# Patient Record
Sex: Female | Born: 1970 | Race: White | Hispanic: No | Marital: Single | Smoking: Former smoker
Health system: Southern US, Community
[De-identification: ages and names within clinical notes are randomized; demographics above are authoritative.]

## PROBLEM LIST (undated history)

## (undated) DIAGNOSIS — B1920 Unspecified viral hepatitis C without hepatic coma: Secondary | ICD-10-CM

## (undated) DIAGNOSIS — E785 Hyperlipidemia, unspecified: Secondary | ICD-10-CM

## (undated) DIAGNOSIS — T8859XA Other complications of anesthesia, initial encounter: Secondary | ICD-10-CM

## (undated) DIAGNOSIS — I10 Essential (primary) hypertension: Secondary | ICD-10-CM

## (undated) DIAGNOSIS — F329 Major depressive disorder, single episode, unspecified: Secondary | ICD-10-CM

## (undated) DIAGNOSIS — F32A Depression, unspecified: Secondary | ICD-10-CM

## (undated) DIAGNOSIS — B1910 Unspecified viral hepatitis B without hepatic coma: Secondary | ICD-10-CM

## (undated) DIAGNOSIS — E079 Disorder of thyroid, unspecified: Secondary | ICD-10-CM

## (undated) DIAGNOSIS — Z9889 Other specified postprocedural states: Secondary | ICD-10-CM

## (undated) DIAGNOSIS — N189 Chronic kidney disease, unspecified: Secondary | ICD-10-CM

## (undated) DIAGNOSIS — G589 Mononeuropathy, unspecified: Secondary | ICD-10-CM

## (undated) DIAGNOSIS — R112 Nausea with vomiting, unspecified: Secondary | ICD-10-CM

## (undated) DIAGNOSIS — E119 Type 2 diabetes mellitus without complications: Secondary | ICD-10-CM

## (undated) HISTORY — PX: CYSTECTOMY: SUR359

## (undated) HISTORY — DX: Chronic kidney disease, unspecified: N18.9

## (undated) HISTORY — DX: Mononeuropathy, unspecified: G58.9

## (undated) HISTORY — DX: Hyperlipidemia, unspecified: E78.5

## (undated) HISTORY — DX: Depression, unspecified: F32.A

## (undated) HISTORY — PX: BACK SURGERY: SHX140

---
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
## 2013-07-10 ENCOUNTER — Inpatient Hospital Stay: Payer: Self-pay | Admitting: Internal Medicine

## 2013-07-10 LAB — HEMOGLOBIN A1C: Hemoglobin A1C: 13.8 % — ABNORMAL HIGH (ref 4.2–6.3)

## 2013-07-10 LAB — URINALYSIS, COMPLETE
BILIRUBIN, UR: NEGATIVE
Bacteria: NONE SEEN
Leukocyte Esterase: NEGATIVE
Nitrite: NEGATIVE
PH: 6 (ref 4.5–8.0)
RBC,UR: 295 /HPF (ref 0–5)
Specific Gravity: 1.033 (ref 1.003–1.030)
WBC UR: 27 /HPF (ref 0–5)

## 2013-07-10 LAB — CBC
HCT: 40.2 % (ref 35.0–47.0)
HGB: 14.1 g/dL (ref 12.0–16.0)
MCH: 31.5 pg (ref 26.0–34.0)
MCHC: 35 g/dL (ref 32.0–36.0)
MCV: 90 fL (ref 80–100)
PLATELETS: 281 10*3/uL (ref 150–440)
RBC: 4.47 10*6/uL (ref 3.80–5.20)
RDW: 12.6 % (ref 11.5–14.5)
WBC: 18.5 10*3/uL — ABNORMAL HIGH (ref 3.6–11.0)

## 2013-07-10 LAB — COMPREHENSIVE METABOLIC PANEL
Albumin: 3.2 g/dL — ABNORMAL LOW (ref 3.4–5.0)
Alkaline Phosphatase: 121 U/L — ABNORMAL HIGH
Anion Gap: 11 (ref 7–16)
BUN: 15 mg/dL (ref 7–18)
Bilirubin,Total: 1.1 mg/dL — ABNORMAL HIGH (ref 0.2–1.0)
CHLORIDE: 98 mmol/L (ref 98–107)
CO2: 23 mmol/L (ref 21–32)
CREATININE: 1.17 mg/dL (ref 0.60–1.30)
Calcium, Total: 9.1 mg/dL (ref 8.5–10.1)
EGFR (Non-African Amer.): 58 — ABNORMAL LOW
GLUCOSE: 371 mg/dL — AB (ref 65–99)
Osmolality: 280 (ref 275–301)
POTASSIUM: 3.6 mmol/L (ref 3.5–5.1)
SGOT(AST): 22 U/L (ref 15–37)
SGPT (ALT): 49 U/L (ref 12–78)
Sodium: 132 mmol/L — ABNORMAL LOW (ref 136–145)
TOTAL PROTEIN: 8.3 g/dL — AB (ref 6.4–8.2)

## 2013-07-10 LAB — LIPASE, BLOOD: Lipase: 131 U/L (ref 73–393)

## 2013-07-10 LAB — TSH: Thyroid Stimulating Horm: 16 u[IU]/mL — ABNORMAL HIGH

## 2013-07-11 LAB — URINE CULTURE

## 2013-07-11 LAB — CBC WITH DIFFERENTIAL/PLATELET
BASOS ABS: 0.1 10*3/uL (ref 0.0–0.1)
BASOS PCT: 0.8 %
EOS ABS: 0.2 10*3/uL (ref 0.0–0.7)
EOS PCT: 1.3 %
HCT: 34.8 % — AB (ref 35.0–47.0)
HGB: 11.8 g/dL — AB (ref 12.0–16.0)
Lymphocyte #: 2.8 10*3/uL (ref 1.0–3.6)
Lymphocyte %: 20 %
MCH: 30.7 pg (ref 26.0–34.0)
MCHC: 33.8 g/dL (ref 32.0–36.0)
MCV: 91 fL (ref 80–100)
Monocyte #: 1.5 x10 3/mm — ABNORMAL HIGH (ref 0.2–0.9)
Monocyte %: 10.9 %
Neutrophil #: 9.3 10*3/uL — ABNORMAL HIGH (ref 1.4–6.5)
Neutrophil %: 67 %
PLATELETS: 228 10*3/uL (ref 150–440)
RBC: 3.84 10*6/uL (ref 3.80–5.20)
RDW: 12.6 % (ref 11.5–14.5)
WBC: 13.8 10*3/uL — ABNORMAL HIGH (ref 3.6–11.0)

## 2013-07-11 LAB — BASIC METABOLIC PANEL
Anion Gap: 7 (ref 7–16)
BUN: 17 mg/dL (ref 7–18)
CALCIUM: 8 mg/dL — AB (ref 8.5–10.1)
CHLORIDE: 105 mmol/L (ref 98–107)
CO2: 23 mmol/L (ref 21–32)
CREATININE: 1.1 mg/dL (ref 0.60–1.30)
EGFR (African American): >60
EGFR (Non-African Amer.): >60
Glucose: 217 mg/dL — ABNORMAL HIGH (ref 65–99)
OSMOLALITY: 278 (ref 275–301)
Potassium: 3.4 mmol/L — ABNORMAL LOW (ref 3.5–5.1)
Sodium: 135 mmol/L — ABNORMAL LOW (ref 136–145)

## 2013-07-11 LAB — TSH: THYROID STIMULATING HORM: 25.4 u[IU]/mL — AB

## 2013-07-15 LAB — CULTURE, BLOOD (SINGLE)

## 2013-10-11 ENCOUNTER — Emergency Department: Payer: Self-pay | Admitting: Emergency Medicine

## 2013-10-11 LAB — URINALYSIS, COMPLETE
BLOOD: NEGATIVE
Bacteria: NONE SEEN
Bilirubin,UR: NEGATIVE
KETONE: NEGATIVE
Leukocyte Esterase: NEGATIVE
Nitrite: NEGATIVE
Ph: 5 (ref 4.5–8.0)
Protein: NEGATIVE
RBC,UR: <1 /HPF (ref 0–5)
SPECIFIC GRAVITY: 1.02 (ref 1.003–1.030)
Squamous Epithelial: 3
WBC UR: 2 /HPF (ref 0–5)

## 2013-10-11 LAB — CBC WITH DIFFERENTIAL/PLATELET
Basophil #: 0.2 10*3/uL — ABNORMAL HIGH (ref 0.0–0.1)
Basophil %: 1.8 %
EOS ABS: 0.5 10*3/uL (ref 0.0–0.7)
Eosinophil %: 5 %
HCT: 33.7 % — ABNORMAL LOW (ref 35.0–47.0)
HGB: 11.4 g/dL — ABNORMAL LOW (ref 12.0–16.0)
LYMPHS ABS: 3.5 10*3/uL (ref 1.0–3.6)
Lymphocyte %: 37.8 %
MCH: 31.3 pg (ref 26.0–34.0)
MCHC: 34 g/dL (ref 32.0–36.0)
MCV: 92 fL (ref 80–100)
MONOS PCT: 9.4 %
Monocyte #: 0.9 x10 3/mm (ref 0.2–0.9)
Neutrophil #: 4.2 10*3/uL (ref 1.4–6.5)
Neutrophil %: 46 %
PLATELETS: 340 10*3/uL (ref 150–440)
RBC: 3.66 10*6/uL — ABNORMAL LOW (ref 3.80–5.20)
RDW: 13.7 % (ref 11.5–14.5)
WBC: 9.2 10*3/uL (ref 3.6–11.0)

## 2013-10-11 LAB — BASIC METABOLIC PANEL
ANION GAP: 9 (ref 7–16)
BUN: 26 mg/dL — AB (ref 7–18)
CALCIUM: 8.9 mg/dL (ref 8.5–10.1)
CHLORIDE: 107 mmol/L (ref 98–107)
CREATININE: 1.54 mg/dL — AB (ref 0.60–1.30)
Co2: 19 mmol/L — ABNORMAL LOW (ref 21–32)
EGFR (Non-African Amer.): 41 — ABNORMAL LOW
GFR CALC AF AMER: 48 — AB
GLUCOSE: 389 mg/dL — AB (ref 65–99)
Osmolality: 291 (ref 275–301)
POTASSIUM: 4.1 mmol/L (ref 3.5–5.1)
SODIUM: 135 mmol/L — AB (ref 136–145)

## 2013-12-25 ENCOUNTER — Emergency Department: Payer: Self-pay | Admitting: Internal Medicine

## 2014-06-16 ENCOUNTER — Emergency Department: Payer: Self-pay | Admitting: Emergency Medicine

## 2014-07-13 NOTE — H&P (Signed)
PATIENT NAME:  Sandra Parsons, Sandra Parsons MR#:  [id number] DATE OF BIRTH:  1970/08/09  DATE OF ADMISSION:  07/10/2013  PRIMARY CARE PHYSICIAN: Nonlocal.   REFERRING PHYSICIAN: Dr. Manson PasseyBrown.   CHIEF COMPLAINT: Right lower quadrant abdominal pain and back pain.   HISTORY OF PRESENT ILLNESS: The patient is a 44 year old female, past medical history of diabetes mellitus, hypertension, hypothyroidism and hepatitis C, who is presenting to the Emergency Room with a chief complaint of right lower quadrant abdominal pain and back pain, associated with fever, nausea and vomiting. The patient is reporting that her pain suddenly started yesterday in the mid afternoon, associated with fevers. The patient was vomiting, and right lower quadrant abdominal pain is cramping and 10 out of 10 in nature. She denies any blood in her vomit. The patient came to the Emergency Room as her abdominal pain and back pain were not improving. The patient's white count is elevated at 18.5. Urine revealed no nitrites and leukocyte esterase. CT scan of the abdomen and pelvis has revealed a nonradiopaque stone disease versus pyelonephritis Hospitalist team is called to admit the patient for the acute pyelonephritis. During my examination, the patient is uncomfortable, still complaining of right-sided flank pain. No similar complaints in the past. The patient stopped taking her diabetes and hypertension medications for the past two months, after she moved from a different location and also has some financial issues.   PAST MEDICAL HISTORY: Diabetes mellitus, hypertension, hypothyroidism, hepatitis C.   PAST SURGICAL HISTORY: Back surgery   ALLERGIES: No known drug allergies.   PSYCHOSOCIAL HISTORY: Lives at home. No history of smoking. No alcohol or illicit drug usage.   FAMILY HISTORY: Cancer runs in her family. Mom has some cancer.   HOME MEDICATIONS: List is not available.   REVIEW OF SYSTEMS:  CONSTITUTIONAL: Complaining of low-grade  fever, fatigue and weakness.  EYES: Denies blurry vision, double vision, glaucoma, cataracts.  EARS, NOSE, THROAT: Denies epistaxis, discharge, snoring.  RESPIRATION: Denies cough, chronic obstructive pulmonary disease. Denies pneumonia.  CARDIOVASCULAR: No chest pain, palpitations, syncope.  GASTROINTESTINAL: Complaining of nausea, vomiting, right lower quadrant abdominal pain associated with flank pain. No hematemesis, melena.  GENITOURINARY: No dysuria, hematuria. Complaining of frequency.  GYNECOLOGIC AND BREASTS: Denies breast mass or vaginal discharge.  ENDOCRINE: Denies polyuria, nocturia. Has hypothyroidism. Has chronic history of diabetes mellitus.  HEMATOLOGIC AND LYMPHATIC: No anemia, easy bruising, bleeding.  INTEGUMENTARY: No acne, rash, lesions.  MUSCULOSKELETAL: Complaining of right flank pain. Denies gout, arthritis.  NEUROLOGIC: Denies vertigo, ataxia, dementia.  PSYCHIATRIC: No ADD, OCD.   PHYSICAL EXAMINATION:  VITAL SIGNS: Temperature 98.4, pulse 86, respirations 18, blood pressure 178/88, pulse oximetry is 98%.  GENERAL APPEARANCE: Not in acute distress. Moderately built and nourished.  HEENT: Normocephalic, atraumatic. Pupils are equally reacting to light and accommodation. No scleral icterus. No conjunctival injection. No sinus tenderness. No postnasal drip. Dry mucous membranes.  NECK: Supple. No JVD. No thyromegaly. Range of motion is intact.  LUNGS: Clear to auscultation bilaterally. No accessory muscle use and no anterior chest wall tenderness on palpation.  CARDIAC: S1, S2 normal. Regular rate and rhythm. No murmurs.  GASTROINTESTINAL: Soft. Bowel sounds are positive in all four quadrants. Positive right lower quadrant tenderness and right flank pain. No masses felt. No rebound tenderness.  NEUROLOGIC: Awake, alert, oriented x3. Cranial nerves II through XII are intact. Motor and sensory are intact. Reflexes are 2+.  EXTREMITIES: No edema. No cyanosis. No clubbing.   SKIN: Warm to touch. Slightly decreased turgor. No  rashes. No lesions.  MUSCULOSKELETAL: No joint effusion, tenderness, erythema.  PSYCHIATRIC: Normal mood and affect.   LABORATORIES AND IMAGING STUDIES: Urinalysis: Yellow in color, hazy in appearance. Ketones 1+, blood 3+, nitrites and leukocyte esterase are negative. WBC 27. pH 7.43, pCO2 38. WBC 18.5. Hemoglobin, hematocrit and platelets are normal. LFTs: Total protein 8.3, albumin 3.2, bilirubin total 1.1, alkaline phosphatase 121, AST and ALT are normal. Chem-8: Glucose 371, BUN and creatinine are normal. Sodium 132, potassium 3.6, chloride 98, CO2 23. GFR is 58. Anion gap 11. Serum osmolality 280. Calcium 9.1, lipase is 131. Ultrasound of the abdomen has revealed a small amount of free fluid demonstrated in more recent spots. Liver, gallbladder, common bile duct otherwise unremarkable. CAT scan of the abdomen and pelvis without contrast for stone: Inflammatory process surrounding the right kidney increased density in the proximal right ureter. Changes could represent nonradiopaque stone disease versus pyelonephritis. No focal abscess or cholelithiasis.   ASSESSMENT AND PLAN: A 44 year old female presenting to the Emergency Room with a chief complaint of right lower quadrant abdominal pain and flank pain, with blood in urine. Will be admitted with the following assessment and plan.   1.  Acute pyelonephritis versus possible nephrolithiasis. We will admit her to medical/surgical floor. Blood cultures were obtained. IV fluids will be given. The patient will be on IV antibiotics, Rocephin. Pain management with morphine if needed. If necessary, will get a neurology consult. If there is no clinical improvement regarding the possibility of nephrolithiasis.  2.  Diabetes mellitus. The patient has stopped taking all her diabetic pills. We will check hemoglobin A1c. Currently, patient will be on sliding scale insulin.  3.  Hypertension. The patient is not on  any home medications for blood pressure. Will monitor restart  blood pressure medications.  4.  Hypothyroidism. We will check TSH and restart her Synthroid.  5.  We will provide gastrointestinal and deep vein thrombosis prophylaxis.   Plan of care was discussed in detail with the patient. She is aware of the plan.   TOTAL TIME SPENT ON ADMISSION: 50 minutes.     ____________________________ Ramonita Lab, MD ag:cg D: 07/10/2013 07:34:31 ET T: 07/10/2013 08:09:16 ET JOB#: [id number]  cc: Ramonita Lab, MD, <Dictator> Ramonita Lab MD ELECTRONICALLY SIGNED 07/25/2013 4:09

## 2014-07-13 NOTE — Discharge Summary (Signed)
PATIENT NAME:  Sandra Parsons, Sandra Parsons MR#:  [id number] DATE OF BIRTH:  1970-12-16  DATE OF ADMISSION:  07/10/2013 DATE OF DISCHARGE:  07/11/2013  PRESENTING COMPLAINT: Abdominal and flank pain.   DISCHARGE DIAGNOSES: 1. Systemic inflammatory response syndrome secondary to acute pyelonephritis right side, improved.  2. Type 1 diabetes.  3. Hypertension.  4. Right flank pain.  5. Hypothyroidism.  6. CODE STATUS: Full code.   MEDICATIONS: 1. Ibuprofen 400 mg 1 tablet every eight hours as needed with food.  2. Lisinopril 10 mg daily.  3. Insulin 70/30 20 units b.i.d.   4. Cipro 500 mg b.i.d.  5. Levothyroxine 100 mcg p.o. daily.   The patient is advised to follow up with [facility].   DIET: Low sodium, ADA 1800 calorie diet.   LABORATORY DATA: White count was 13.8. H and H is 11.8 and 34.8.   Glucose is 217, BUN 17, creatinine is 1.10. Sodium is 135. Potassium 3.4. TSH is 25.4. A1c is 13.8. Blood cultures negative in 48 hours.   Ultrasound of the abdomen showed small amount of fluid demonstrated a Morison pouch.  Gallbladder, common bile duct otherwise unremarkable.   CT of the abdomen on admission showed inflammatory process in the right kidney with   increased density in the proximal right ureter. As described changes could ne nonradiopaque stone versus pyelonephritis,  no focal abscess. Cholelithiasis noted.   HOSPITAL COURSE: Ms. Sandra Parsons is a 44 year old Caucasian female with past medical history of hypertension and type 1 diabetes, comes to the Emergency Room with:  1. Systemic inflammatory response syndrome due to acute right pyelonephritis with possible nephrolithiasis. The patient was started on IV Rocephin. IV fluids were given, p.r.n. pain meds were given as well.  The patient's blood culture remained negative. White count  came down to 13.8. She was changed to p.o. Cipro for a total of 10 days.  2. Type 1 diabetes. The patient has history of diabetes since age of 89. She has  been noncompliant with her insulin due to financial reasons. Care management assisted with getting the patient insulin prefilled syringe 70/30. She will be taking 20 units b.i.d. We will also get her glucometer to check her sugars at home.  3. Hypertension. Resume lisinopril.  4. Hypothyroidism. Resumed her Synthroid.  5. The patient was advised to make an appointment to follow up with [facility]. Hospital stay otherwise remained stable.   TIME SPENT: 40 minutes.   ___________ Wylie HailSona A. Allena KatzPatel, MD sap:sg D: 07/12/2013 07:24:23 ET T: 07/12/2013 07:40:55 ET JOB#: [id number]  cc: Armando Lauman A. Allena KatzPatel, MD, <Dictator> [facility]  Willow OraSONA A Rania Prothero MD ELECTRONICALLY SIGNED 07/16/2013 9:16

## 2016-03-20 ENCOUNTER — Emergency Department (HOSPITAL_COMMUNITY)
Admission: EM | Admit: 2016-03-20 | Discharge: 2016-03-20 | Disposition: A | Discharge status: Home / Self Care | Payer: Self-pay | Attending: Emergency Medicine | Admitting: Emergency Medicine

## 2016-03-20 ENCOUNTER — Encounter (HOSPITAL_COMMUNITY): Payer: Self-pay | Admitting: Emergency Medicine

## 2016-03-20 DIAGNOSIS — I10 Essential (primary) hypertension: Secondary | ICD-10-CM | POA: Insufficient documentation

## 2016-03-20 DIAGNOSIS — R739 Hyperglycemia, unspecified: Secondary | ICD-10-CM

## 2016-03-20 DIAGNOSIS — E1165 Type 2 diabetes mellitus with hyperglycemia: Secondary | ICD-10-CM | POA: Insufficient documentation

## 2016-03-20 DIAGNOSIS — R590 Localized enlarged lymph nodes: Secondary | ICD-10-CM

## 2016-03-20 DIAGNOSIS — J069 Acute upper respiratory infection, unspecified: Secondary | ICD-10-CM | POA: Insufficient documentation

## 2016-03-20 DIAGNOSIS — Z87891 Personal history of nicotine dependence: Secondary | ICD-10-CM | POA: Insufficient documentation

## 2016-03-20 HISTORY — DX: Essential (primary) hypertension: I10

## 2016-03-20 HISTORY — DX: Type 2 diabetes mellitus without complications: E11.9

## 2016-03-20 HISTORY — DX: Unspecified viral hepatitis C without hepatic coma: B19.20

## 2016-03-20 HISTORY — DX: Disorder of thyroid, unspecified: E07.9

## 2016-03-20 HISTORY — DX: Unspecified viral hepatitis B without hepatic coma: B19.10

## 2016-03-20 LAB — COMPREHENSIVE METABOLIC PANEL
ALT: 29 U/L (ref 14–54)
ANION GAP: 8 (ref 5–15)
AST: 33 U/L (ref 15–41)
Albumin: 3.7 g/dL (ref 3.5–5.0)
Alkaline Phosphatase: 64 U/L (ref 38–126)
BILIRUBIN TOTAL: 0.7 mg/dL (ref 0.3–1.2)
BUN: 27 mg/dL — AB (ref 6–20)
CHLORIDE: 100 mmol/L — AB (ref 101–111)
CO2: 24 mmol/L (ref 22–32)
Calcium: 9.5 mg/dL (ref 8.9–10.3)
Creatinine, Ser: 1.47 mg/dL — ABNORMAL HIGH (ref 0.44–1.00)
GFR, EST AFRICAN AMERICAN: 49 mL/min — AB (ref >60)
GFR, EST NON AFRICAN AMERICAN: 42 mL/min — AB (ref >60)
Glucose, Bld: 470 mg/dL — ABNORMAL HIGH (ref 65–99)
POTASSIUM: 4 mmol/L (ref 3.5–5.1)
Sodium: 132 mmol/L — ABNORMAL LOW (ref 135–145)
TOTAL PROTEIN: 8.1 g/dL (ref 6.5–8.1)

## 2016-03-20 LAB — URINALYSIS, ROUTINE W REFLEX MICROSCOPIC
Bilirubin Urine: NEGATIVE
Ketones, ur: NEGATIVE mg/dL
Nitrite: NEGATIVE
PROTEIN: 30 mg/dL — AB
SPECIFIC GRAVITY, URINE: 1.01 (ref 1.005–1.030)
pH: 5.5 (ref 5.0–8.0)

## 2016-03-20 LAB — CBC WITH DIFFERENTIAL/PLATELET
BASOS ABS: 0.2 10*3/uL — AB (ref 0.0–0.1)
Basophils Relative: 2 %
EOS PCT: 12 %
Eosinophils Absolute: 1.1 10*3/uL — ABNORMAL HIGH (ref 0.0–0.7)
HCT: 37.7 % (ref 36.0–46.0)
HEMOGLOBIN: 13 g/dL (ref 12.0–15.0)
LYMPHS ABS: 1.9 10*3/uL (ref 0.7–4.0)
LYMPHS PCT: 20 %
MCH: 32 pg (ref 26.0–34.0)
MCHC: 34.5 g/dL (ref 30.0–36.0)
MCV: 92.9 fL (ref 78.0–100.0)
Monocytes Absolute: 0.9 10*3/uL (ref 0.1–1.0)
Monocytes Relative: 10 %
NEUTROS ABS: 5.3 10*3/uL (ref 1.7–7.7)
NEUTROS PCT: 56 %
PLATELETS: 253 10*3/uL (ref 150–400)
RBC: 4.06 MIL/uL (ref 3.87–5.11)
RDW: 12.6 % (ref 11.5–15.5)
WBC: 9.4 10*3/uL (ref 4.0–10.5)

## 2016-03-20 LAB — URINALYSIS, MICROSCOPIC (REFLEX)

## 2016-03-20 LAB — POC URINE PREG, ED: PREG TEST UR: NEGATIVE

## 2016-03-20 LAB — CBG MONITORING, ED
Glucose-Capillary: 433 mg/dL — ABNORMAL HIGH (ref 65–99)
Glucose-Capillary: 332 mg/dL — ABNORMAL HIGH (ref 65–99)

## 2016-03-20 MED ORDER — CYCLOBENZAPRINE HCL 10 MG PO TABS: 10.0000 mg | ORAL_TABLET | Freq: Three times a day (TID) | ORAL | 0 refills | Status: DC | PRN

## 2016-03-20 MED ORDER — SULFAMETHOXAZOLE-TRIMETHOPRIM 800-160 MG PO TABS: 1.0000 | ORAL_TABLET | Freq: Two times a day (BID) | ORAL | 0 refills | Status: AC

## 2016-03-20 MED ORDER — KETOROLAC TROMETHAMINE 30 MG/ML IJ SOLN
30.0000 mg | Freq: Once | INTRAMUSCULAR | Status: AC
  Administered 2016-03-20: 30 mg via INTRAVENOUS
  Filled 2016-03-20: qty 1

## 2016-03-20 MED ORDER — SODIUM CHLORIDE 0.9 % IV BOLUS (SEPSIS)
1000.0000 mL | Freq: Once | INTRAVENOUS | Status: AC
  Administered 2016-03-20: 1000 mL via INTRAVENOUS

## 2016-03-20 MED ORDER — CYCLOBENZAPRINE HCL 10 MG PO TABS
10.0000 mg | ORAL_TABLET | Freq: Once | ORAL | Status: AC
  Administered 2016-03-20: 10 mg via ORAL
  Filled 2016-03-20: qty 1

## 2016-03-20 MED ORDER — METFORMIN HCL 500 MG PO TABS: 500.0000 mg | ORAL_TABLET | Freq: Two times a day (BID) | ORAL | 0 refills | Status: DC

## 2016-03-20 MED ORDER — ONDANSETRON HCL 4 MG/2ML IJ SOLN
4.0000 mg | Freq: Once | INTRAMUSCULAR | Status: AC
  Administered 2016-03-20: 4 mg via INTRAVENOUS
  Filled 2016-03-20: qty 2

## 2016-03-20 MED ORDER — MUPIROCIN CALCIUM 2 % NA OINT: TOPICAL_OINTMENT | NASAL | 0 refills | Status: DC

## 2016-03-20 NOTE — ED Triage Notes (Signed)
Pt reports nasal congestion, sores in nose with bleeding, knot on back of head without injury, and has not been on any medications since April for chronic medical problems.  Has been taking sister's Glyburide for glucose control.

## 2016-03-20 NOTE — ED Provider Notes (Signed)
[facility]-EMERGENCY DEPT Provider Note   CSN: [id number] Arrival date & time: 03/20/16  0825     History   Chief Complaint Chief Complaint  Patient presents with  . Nasal Congestion  . multiple complaints    HPI Sandra Parsons is a 45 y.o. female.  HPI  Sandra Parsons is a 45 y.o. female who presents to the Emergency Department complaining of nasal congestion, bumps in her nostrils, painful "bumps" to the back of her neck and lack of her routine medications.  URI type symptoms have been present for several days and she has been without her diabetes medication for "months"  She has recently moved to the area and has not established a PMD.  She states that she feels weak and has a dry mouth.  Concerned that her blood sugar is elevated.  She denies fever, vomiting, shortness of breath, and chest pain.   Past Medical History:  Diagnosis Date  . Diabetes mellitus without complication (HCC)   . Hepatitis B   . Hepatitis C   . Hypertension   . Thyroid disease     There are no active problems to display for this patient.   Past Surgical History:  Procedure Laterality Date  . BACK SURGERY      OB History    No data available       Home Medications    Prior to Admission medications   Medication Sig Start Date End Date Taking? Authorizing Provider  levothyroxine (SYNTHROID, LEVOTHROID) 75 MCG tablet Take 1 tablet by mouth daily. 05/13/15  Yes Historical Provider, MD  lisinopril (PRINIVIL,ZESTRIL) 10 MG tablet Take 10 mg by mouth daily. 05/13/15  Yes Historical Provider, MD  metFORMIN (GLUCOPHAGE) 500 MG tablet Take 500 mg by mouth 2 (two) times daily with a meal. 05/13/15 05/12/16 Yes Historical Provider, MD    Family History History reviewed. No pertinent family history.  Social History Social History  Substance Use Topics  . Smoking status: Former Smoker    Types: Cigarettes  . Smokeless tobacco: Never Used  . Alcohol use No     Allergies   Prednisone   Review  of Systems Review of Systems  Constitutional: Negative for activity change, appetite change, chills and fever.  HENT: Positive for congestion, rhinorrhea and sore throat. Negative for facial swelling and trouble swallowing.        Scalp pain and "bumps"  Eyes: Negative for visual disturbance.  Respiratory: Positive for cough. Negative for shortness of breath, wheezing and stridor.   Gastrointestinal: Negative for nausea and vomiting.  Genitourinary: Negative for dysuria and hematuria.  Musculoskeletal: Negative for back pain, neck pain and neck stiffness.  Skin: Negative.  Negative for rash.  Neurological: Negative for dizziness, weakness (generalized weakness), numbness and headaches.  Hematological: Negative for adenopathy.  Psychiatric/Behavioral: Negative for confusion.  All other systems reviewed and are negative.    Physical Exam Updated Vital Signs BP (!) 190/112 (BP Location: Left Arm)   Pulse 96   Temp 97.8 F (36.6 C) (Oral)   Resp 18   Ht 5\' 7"  (1.702 m)   Wt 99.8 kg   LMP 02/28/2016   SpO2 96%   BMI 34.46 kg/m   Physical Exam  Constitutional: She is oriented to person, place, and time. She appears well-developed and well-nourished. No distress.  HENT:  Head: Normocephalic and atraumatic.  Right Ear: Tympanic membrane and ear canal normal.  Left Ear: Tympanic membrane and ear canal normal.  Nose: Mucosal edema and rhinorrhea present.  Mouth/Throat: Uvula is midline and mucous membranes are normal. No trismus in the jaw. No uvula swelling. Posterior oropharyngeal erythema present. No oropharyngeal exudate, posterior oropharyngeal edema or tonsillar abscesses.  Eyes: Conjunctivae are normal.  Neck: Normal range of motion and phonation normal. Neck supple. No Kernig's sign noted.  Cardiovascular: Normal rate, regular rhythm and intact distal pulses.   No murmur heard. Pulmonary/Chest: Effort normal and breath sounds normal. No respiratory distress. She has no  wheezes. She has no rales.  Abdominal: Soft. She exhibits no distension. There is no tenderness. There is no rebound and no guarding.  Musculoskeletal: Normal range of motion. She exhibits no edema.  Lymphadenopathy:       Head (right side): Occipital adenopathy present.    She has no cervical adenopathy.  Neurological: She is alert and oriented to person, place, and time. She exhibits normal muscle tone. Coordination normal.  Skin: Skin is warm and dry.  Nursing note and vitals reviewed.    ED Treatments / Results  Labs (all labs ordered are listed, but only abnormal results are displayed) Labs Reviewed  COMPREHENSIVE METABOLIC PANEL - Abnormal; Notable for the following:       Result Value   Sodium 132 (*)    Chloride 100 (*)    Glucose, Bld 470 (*)    BUN 27 (*)    Creatinine, Ser 1.47 (*)    GFR calc non Af Amer 42 (*)    GFR calc Af Amer 49 (*)    All other components within normal limits  CBC WITH DIFFERENTIAL/PLATELET - Abnormal; Notable for the following:    Eosinophils Absolute 1.1 (*)    Basophils Absolute 0.2 (*)    All other components within normal limits  URINALYSIS, ROUTINE W REFLEX MICROSCOPIC - Abnormal; Notable for the following:    APPearance HAZY (*)    Glucose, UA >=500 (*)    Hgb urine dipstick TRACE (*)    Protein, ur 30 (*)    Leukocytes, UA SMALL (*)    All other components within normal limits  URINALYSIS, MICROSCOPIC (REFLEX) - Abnormal; Notable for the following:    Bacteria, UA FEW (*)    Squamous Epithelial / LPF 6-30 (*)    All other components within normal limits  CBG MONITORING, ED - Abnormal; Notable for the following:    Glucose-Capillary 433 (*)    All other components within normal limits  POC URINE PREG, ED    EKG  EKG Interpretation None       Radiology No results found.  Procedures Procedures (including critical care time)  Medications Ordered in ED Medications  sodium chloride 0.9 % bolus 1,000 mL (1,000 mLs  Intravenous New Bag/Given 03/20/16 0940)  ondansetron (ZOFRAN) injection 4 mg (4 mg Intravenous Given 03/20/16 0940)  ketorolac (TORADOL) 30 MG/ML injection 30 mg (30 mg Intravenous Given 03/20/16 0940)     Initial Impression / Assessment and Plan / ED Course  I have reviewed the triage vital signs and the nursing notes.  Pertinent labs & imaging results that were available during my care of the patient were reviewed by me and considered in my medical decision making (see chart for details).  Clinical Course     Pt non-toxic appearing.  Vitals stable.  No clinical signs of DKA, Anion gap is 8   Pt is feeling better after IVF's,  BS improved.  Pt states that she currently doesn't have money for insulin requesting rx for medication on $4 lost at  [employer].  Will start metformin, referral list and info for case manger given.  Pt appears stable for d/c.  Return precautions given.    Final Clinical Impressions(s) / ED Diagnoses   Final diagnoses:  Upper respiratory tract infection, unspecified type  Hyperglycemia  Lymphadenopathy, occipital    New Prescriptions Discharge Medication List as of 03/20/2016  1:11 PM    START taking these medications   Details  cyclobenzaprine (FLEXERIL) 10 MG tablet Take 1 tablet (10 mg total) by mouth 3 (three) times daily as needed., Starting Sat 03/20/2016, Print    metFORMIN (GLUCOPHAGE) 500 MG tablet Take 1 tablet (500 mg total) by mouth 2 (two) times daily with a meal., Starting Sat 03/20/2016, Print    mupirocin nasal ointment (BACTROBAN) 2 % Apply to each nostril TID x 7 days, Print    sulfamethoxazole-trimethoprim (BACTRIM DS,SEPTRA DS) 800-160 MG tablet Take 1 tablet by mouth 2 (two) times daily., Starting Sat 03/20/2016, Until Sat 03/27/2016, Print         Elease Swarm [place]riplett, PA-C 03/24/16 2244    Maia PlanJoshua G Long, MD 03/25/16 [id number]

## 2016-03-20 NOTE — Discharge Instructions (Signed)
Call one of the clinics listed to establish a primary care doctor.

## 2016-05-17 ENCOUNTER — Emergency Department (HOSPITAL_COMMUNITY)
Admission: EM | Admit: 2016-05-17 | Discharge: 2016-05-17 | Disposition: A | Discharge status: Home / Self Care | Payer: Self-pay | Attending: Emergency Medicine | Admitting: Emergency Medicine

## 2016-05-17 ENCOUNTER — Emergency Department (HOSPITAL_COMMUNITY): Payer: Self-pay

## 2016-05-17 ENCOUNTER — Encounter (HOSPITAL_COMMUNITY): Payer: Self-pay | Admitting: Emergency Medicine

## 2016-05-17 DIAGNOSIS — Z7984 Long term (current) use of oral hypoglycemic drugs: Secondary | ICD-10-CM | POA: Insufficient documentation

## 2016-05-17 DIAGNOSIS — E119 Type 2 diabetes mellitus without complications: Secondary | ICD-10-CM | POA: Insufficient documentation

## 2016-05-17 DIAGNOSIS — I10 Essential (primary) hypertension: Secondary | ICD-10-CM | POA: Insufficient documentation

## 2016-05-17 DIAGNOSIS — Z87891 Personal history of nicotine dependence: Secondary | ICD-10-CM | POA: Insufficient documentation

## 2016-05-17 DIAGNOSIS — J40 Bronchitis, not specified as acute or chronic: Secondary | ICD-10-CM | POA: Insufficient documentation

## 2016-05-17 LAB — COMPREHENSIVE METABOLIC PANEL
ALT: 25 U/L (ref 14–54)
AST: 27 U/L (ref 15–41)
Albumin: 3.7 g/dL (ref 3.5–5.0)
Alkaline Phosphatase: 61 U/L (ref 38–126)
Anion gap: 10 (ref 5–15)
BILIRUBIN TOTAL: 0.6 mg/dL (ref 0.3–1.2)
BUN: 15 mg/dL (ref 6–20)
CHLORIDE: 101 mmol/L (ref 101–111)
CO2: 23 mmol/L (ref 22–32)
Calcium: 9.2 mg/dL (ref 8.9–10.3)
Creatinine, Ser: 0.91 mg/dL (ref 0.44–1.00)
GFR calc Af Amer: >60 mL/min (ref >60)
Glucose, Bld: 263 mg/dL — ABNORMAL HIGH (ref 65–99)
POTASSIUM: 3.6 mmol/L (ref 3.5–5.1)
Sodium: 134 mmol/L — ABNORMAL LOW (ref 135–145)
TOTAL PROTEIN: 8 g/dL (ref 6.5–8.1)

## 2016-05-17 LAB — CBC WITH DIFFERENTIAL/PLATELET
BASOS ABS: 0.2 10*3/uL — AB (ref 0.0–0.1)
BASOS PCT: 2 %
EOS PCT: 21 %
Eosinophils Absolute: 1.8 10*3/uL — ABNORMAL HIGH (ref 0.0–0.7)
HEMATOCRIT: 40.3 % (ref 36.0–46.0)
Hemoglobin: 14.3 g/dL (ref 12.0–15.0)
LYMPHS PCT: 29 %
Lymphs Abs: 2.4 10*3/uL (ref 0.7–4.0)
MCH: 31.9 pg (ref 26.0–34.0)
MCHC: 35.5 g/dL (ref 30.0–36.0)
MCV: 90 fL (ref 78.0–100.0)
MONO ABS: 0.9 10*3/uL (ref 0.1–1.0)
MONOS PCT: 11 %
Neutro Abs: 3.1 10*3/uL (ref 1.7–7.7)
Neutrophils Relative %: 37 %
PLATELETS: 219 10*3/uL (ref 150–400)
RBC: 4.48 MIL/uL (ref 3.87–5.11)
RDW: 12.9 % (ref 11.5–15.5)
WBC: 8.4 10*3/uL (ref 4.0–10.5)

## 2016-05-17 LAB — LIPASE, BLOOD: LIPASE: 19 U/L (ref 11–51)

## 2016-05-17 MED ORDER — ALBUTEROL SULFATE (2.5 MG/3ML) 0.083% IN NEBU
2.5000 mg | INHALATION_SOLUTION | Freq: Once | RESPIRATORY_TRACT | Status: AC
  Administered 2016-05-17: 2.5 mg via RESPIRATORY_TRACT
  Filled 2016-05-17: qty 3

## 2016-05-17 MED ORDER — IBUPROFEN 800 MG PO TABS: 800.0000 mg | ORAL_TABLET | Freq: Three times a day (TID) | ORAL | 0 refills | Status: DC | PRN

## 2016-05-17 MED ORDER — SODIUM CHLORIDE 0.9 % IV BOLUS (SEPSIS)
1000.0000 mL | Freq: Once | INTRAVENOUS | Status: AC
  Administered 2016-05-17: 1000 mL via INTRAVENOUS

## 2016-05-17 MED ORDER — SULFAMETHOXAZOLE-TRIMETHOPRIM 800-160 MG PO TABS: 1.0000 | ORAL_TABLET | Freq: Two times a day (BID) | ORAL | 0 refills | Status: AC

## 2016-05-17 MED ORDER — IPRATROPIUM-ALBUTEROL 0.5-2.5 (3) MG/3ML IN SOLN
3.0000 mL | Freq: Once | RESPIRATORY_TRACT | Status: AC
  Administered 2016-05-17: 3 mL via RESPIRATORY_TRACT
  Filled 2016-05-17: qty 3

## 2016-05-17 NOTE — ED Triage Notes (Signed)
Cough x 2 weeks with several otc meds with no relief. Pt now c/o right abd and side pain started yesterday.  Hurts worse with coughing.

## 2016-05-17 NOTE — ED Provider Notes (Signed)
[facility]-EMERGENCY DEPT Provider Note   CSN: [id number] Arrival date & time: 05/17/16  1308     History   Chief Complaint Chief Complaint  Patient presents with  . Cough    HPI Sandra Parsons is a 46 y.o. female.  Patient complains of cough for over a week. She also complains of some upper abdominal discomfort   The history is provided by the patient. No language interpreter was used.  Cough  This is a new problem. The current episode started more than 1 week ago. The problem occurs constantly. The problem has not changed since onset.The cough is non-productive. There has been no fever. Pertinent negatives include no chest pain and no headaches. She has tried decongestants for the symptoms.    Past Medical History:  Diagnosis Date  . Diabetes mellitus without complication (HCC)   . Hepatitis B   . Hepatitis C   . Hypertension   . Thyroid disease     There are no active problems to display for this patient.   Past Surgical History:  Procedure Laterality Date  . BACK SURGERY      OB History    No data available       Home Medications    Prior to Admission medications   Medication Sig Start Date End Date Taking? Authorizing Provider  metFORMIN (GLUCOPHAGE) 500 MG tablet Take 1 tablet (500 mg total) by mouth 2 (two) times daily with a meal. 03/20/16  Yes Tammy Triplett, PA-C  cyclobenzaprine (FLEXERIL) 10 MG tablet Take 1 tablet (10 mg total) by mouth 3 (three) times daily as needed. Patient not taking: Reported on 05/17/2016 03/20/16   Tammy Triplett, PA-C  ibuprofen (ADVIL,MOTRIN) 800 MG tablet Take 1 tablet (800 mg total) by mouth every 8 (eight) hours as needed for moderate pain. 05/17/16   Bethann Berkshire, MD  mupirocin nasal ointment (BACTROBAN) 2 % Apply to each nostril TID x 7 days Patient not taking: Reported on 05/17/2016 03/20/16   Tammy Triplett, PA-C  sulfamethoxazole-trimethoprim (BACTRIM DS,SEPTRA DS) 800-160 MG tablet Take 1 tablet by mouth 2 (two) times  daily. 05/17/16 05/24/16  Bethann Berkshire, MD    Family History History reviewed. No pertinent family history.  Social History Social History  Substance Use Topics  . Smoking status: Former Smoker    Types: Cigarettes  . Smokeless tobacco: Never Used  . Alcohol use No     Allergies   Prednisone   Review of Systems Review of Systems  Constitutional: Negative for appetite change and fatigue.  HENT: Negative for congestion, ear discharge and sinus pressure.   Eyes: Negative for discharge.  Respiratory: Positive for cough.   Cardiovascular: Negative for chest pain.  Gastrointestinal: Negative for abdominal pain and diarrhea.  Genitourinary: Negative for frequency and hematuria.  Musculoskeletal: Negative for back pain.  Skin: Negative for rash.  Neurological: Negative for seizures and headaches.  Psychiatric/Behavioral: Negative for hallucinations.     Physical Exam Updated Vital Signs BP 182/96 (BP Location: Left Arm)   Pulse 106   Temp 98.3 F (36.8 C) (Oral)   Resp 19   Ht 5\' 7"  (1.702 m)   Wt 215 lb (97.5 kg)   LMP 04/26/2016   SpO2 100%   BMI 33.67 kg/m   Physical Exam  Constitutional: She is oriented to person, place, and time. She appears well-developed.  HENT:  Head: Normocephalic.  Eyes: Conjunctivae and EOM are normal. No scleral icterus.  Neck: Neck supple. No thyromegaly present.  Cardiovascular: Normal rate  and regular rhythm.  Exam reveals no gallop and no friction rub.   No murmur heard. Pulmonary/Chest: No stridor. She has wheezes. She has no rales. She exhibits no tenderness.  Abdominal: She exhibits no distension. There is no tenderness. There is no rebound.  Musculoskeletal: Normal range of motion. She exhibits no edema.  Lymphadenopathy:    She has no cervical adenopathy.  Neurological: She is oriented to person, place, and time. She exhibits normal muscle tone. Coordination normal.  Skin: No rash noted. No erythema.  Psychiatric: She has a  normal mood and affect. Her behavior is normal.     ED Treatments / Results  Labs (all labs ordered are listed, but only abnormal results are displayed) Labs Reviewed  CBC WITH DIFFERENTIAL/PLATELET - Abnormal; Notable for the following:       Result Value   Eosinophils Absolute 1.8 (*)    Basophils Absolute 0.2 (*)    All other components within normal limits  COMPREHENSIVE METABOLIC PANEL - Abnormal; Notable for the following:    Sodium 134 (*)    Glucose, Bld 263 (*)    All other components within normal limits  LIPASE, BLOOD  CBG MONITORING, ED    EKG  EKG Interpretation None       Radiology Dg Chest 2 View  Result Date: 05/17/2016 CLINICAL DATA:  Five days of shortness of breath and left-sided back pain associated with 2 weeks of cough. History of diabetes, discontinued smoking 1 year ago. EXAM: CHEST  2 VIEW COMPARISON:  None in PACs FINDINGS: The lungs are mildly hypoinflated and clear. The heart and pulmonary vascularity are normal. The mediastinum is normal in width. There is no pleural effusion. There is multilevel degenerative disc disease of the thoracic spine. IMPRESSION: Mild hypoinflation. No pneumonia, CHF, nor other acute cardiopulmonary abnormality. Electronically Signed   By: David  [place] M.D.   On: 05/17/2016 14:39   US Abdomen Complete  Result Date: 05/17/2016 CLINICAL DATA:  Right upper quadrant pain . EXAM: ABDOMEN ULTRASOUND COMPLETE COMPARISON:  CT 07/10/2013 . FINDINGS: Gallbladder: No gallstones or wall thickening visualized. No sonographic Murphy sign noted by sonographer. Common bile duct: Diameter: 2.5 mm Liver: There is echogenic suggesting fatty infiltration and/or hepatocellular disease. IVC: No abnormality visualized. Pancreas: Visualized portion unremarkable. Spleen: Size and appearance within normal limits. Right Kidney: Length: 10.4 cm. Echogenicity within normal limits. No mass or hydronephrosis visualized. Left Kidney: Length: 12.1 cm.  Echogenicity within normal limits. No mass or hydronephrosis visualized. Abdominal aorta: No aneurysm visualized. Other findings: None. IMPRESSION: 1. Liver is echogenic consistent fatty infiltration and/or hepatocellular disease. No focal hepatic abnormality identified. 2.  No acute abnormality identified.  No gallstones. Electronically Signed   By: Maisie Fus  Register   On: 05/17/2016 14:26    Procedures Procedures (including critical care time)  Medications Ordered in ED Medications  albuterol (PROVENTIL) (2.5 MG/3ML) 0.083% nebulizer solution 2.5 mg (2.5 mg Nebulization Given 05/17/16 1457)  ipratropium-albuterol (DUONEB) 0.5-2.5 (3) MG/3ML nebulizer solution 3 mL (3 mLs Nebulization Given 05/17/16 1457)  sodium chloride 0.9 % bolus 1,000 mL (1,000 mLs Intravenous New Bag/Given 05/17/16 1353)     Initial Impression / Assessment and Plan / ED Course  I have reviewed the triage vital signs and the nursing notes.  Pertinent labs & imaging results that were available during my care of the patient were reviewed by me and considered in my medical decision making (see chart for details).     Abdominal ultrasound unremarkable. Chest x-ray shows  no pneumonia. Lab work shows elevated sugar. Patient with persistent cough and congestion. She will be placed on Bactrim and Motrin for the pain and follow-up with her PCP as needed  Final Clinical Impressions(s) / ED Diagnoses   Final diagnoses:  Bronchitis    New Prescriptions New Prescriptions   IBUPROFEN (ADVIL,MOTRIN) 800 MG TABLET    Take 1 tablet (800 mg total) by mouth every 8 (eight) hours as needed for moderate pain.   SULFAMETHOXAZOLE-TRIMETHOPRIM (BACTRIM DS,SEPTRA DS) 800-160 MG TABLET    Take 1 tablet by mouth 2 (two) times daily.     Bethann BerkshireJoseph Jalyn Rosero, MD 05/17/16 [phone]

## 2016-05-17 NOTE — ED Notes (Signed)
PT returned from xray

## 2016-05-17 NOTE — Discharge Instructions (Signed)
Follow-up with her family doctor if not improving.  Get your blood pressure rechecked in 1-2 weeks

## 2017-08-19 ENCOUNTER — Emergency Department (HOSPITAL_COMMUNITY)
Admission: EM | Admit: 2017-08-19 | Discharge: 2017-08-19 | Disposition: A | Discharge status: Home / Self Care | Payer: Self-pay | Attending: Emergency Medicine | Admitting: Emergency Medicine

## 2017-08-19 ENCOUNTER — Emergency Department (HOSPITAL_COMMUNITY): Payer: Self-pay

## 2017-08-19 ENCOUNTER — Other Ambulatory Visit: Payer: Self-pay

## 2017-08-19 ENCOUNTER — Encounter (HOSPITAL_COMMUNITY): Payer: Self-pay | Admitting: Emergency Medicine

## 2017-08-19 DIAGNOSIS — Z87891 Personal history of nicotine dependence: Secondary | ICD-10-CM | POA: Insufficient documentation

## 2017-08-19 DIAGNOSIS — M25531 Pain in right wrist: Secondary | ICD-10-CM | POA: Insufficient documentation

## 2017-08-19 DIAGNOSIS — B182 Chronic viral hepatitis C: Secondary | ICD-10-CM | POA: Insufficient documentation

## 2017-08-19 DIAGNOSIS — E119 Type 2 diabetes mellitus without complications: Secondary | ICD-10-CM | POA: Insufficient documentation

## 2017-08-19 DIAGNOSIS — B189 Chronic viral hepatitis, unspecified: Secondary | ICD-10-CM | POA: Insufficient documentation

## 2017-08-19 DIAGNOSIS — I10 Essential (primary) hypertension: Secondary | ICD-10-CM | POA: Insufficient documentation

## 2017-08-19 DIAGNOSIS — Z7984 Long term (current) use of oral hypoglycemic drugs: Secondary | ICD-10-CM | POA: Insufficient documentation

## 2017-08-19 MED ORDER — METHOCARBAMOL 500 MG PO TABS: 500.0000 mg | ORAL_TABLET | Freq: Two times a day (BID) | ORAL | 0 refills | Status: DC

## 2017-08-19 MED ORDER — IBUPROFEN 800 MG PO TABS: 800.0000 mg | ORAL_TABLET | Freq: Three times a day (TID) | ORAL | 0 refills | Status: DC

## 2017-08-19 NOTE — ED Provider Notes (Signed)
[facility] EMERGENCY DEPARTMENT Provider Note   CSN: [id number] Arrival date & time: 08/19/17  1643     History   Chief Complaint Chief Complaint  Patient presents with  . Arm Pain    HPI Sandra Parsons is a 47 y.o. female with a h/o DM Type II, Hepatitis B, Hepatitis C, HTN, and thyroid disease who presents to the emergency department with a chief complaint of right wrist and forearm pain that began yesterday morning after she slipped and fell forward on the wet kitchen floor.  She states that she landed with her right wrist and forearm under her chest and abdomen.  She also complains of muscle spasms in the distal right forearm.  She also complains of mild pain to the bilateral sides of the neck, but no midline pain. She denies hitting her head, LOC, nausea, or emesis.  She was able to get up on her own and was ambulatory after the fall.  She denies right elbow or shoulder pain, numbness, weakness, visual changes, dizziness, or lightheadedness. She has been treating her symptoms with ibuprofen, lidocaine patch, and ice.  Last dose of 600 mg of ibuprofen 2.5 hours prior to arrival.  She is unable to take Tylenol.   She is right-hand dominant.  No history of previous right wrist or hand surgery or injuries.  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  She has been and able to work since the fall yesterday.  The history is provided by the patient. No language interpreter was used.  Arm Pain  Pertinent negatives include no chest pain, no abdominal pain and no shortness of breath.    Past Medical History:  Diagnosis Date  . Diabetes mellitus without complication (HCC)   . Hepatitis B   . Hepatitis C   . Hypertension   . Thyroid disease     There are no active problems to display for this patient.   Past Surgical History:  Procedure Laterality Date  . BACK SURGERY       OB History   None      Home Medications    Prior to  Admission medications   Medication Sig Start Date End Date Taking? Authorizing Provider  cyclobenzaprine (FLEXERIL) 10 MG tablet Take 1 tablet (10 mg total) by mouth 3 (three) times daily as needed. Patient not taking: Reported on 05/17/2016 03/20/16   Pauline Aus, PA-C  ibuprofen (ADVIL,MOTRIN) 800 MG tablet Take 1 tablet (800 mg total) by mouth 3 (three) times daily. 08/19/17   Guenevere Roorda A, PA-C  metFORMIN (GLUCOPHAGE) 500 MG tablet Take 1 tablet (500 mg total) by mouth 2 (two) times daily with a meal. 03/20/16   Triplett, Tammy, PA-C  methocarbamol (ROBAXIN) 500 MG tablet Take 1 tablet (500 mg total) by mouth 2 (two) times daily. 08/19/17   Luceil Herrin A, PA-C  mupirocin nasal ointment (BACTROBAN) 2 % Apply to each nostril TID x 7 days Patient not taking: Reported on 05/17/2016 03/20/16   Pauline Aus, PA-C    Family History No family history on file.  Social History Social History   Tobacco Use  . Smoking status: Former Smoker    Types: Cigarettes  . Smokeless tobacco: Never Used  Substance Use Topics  . Alcohol use: No  . Drug use: Yes    Types: Marijuana     Allergies   Prednisone   Review of Systems Review of Systems  Constitutional: Negative for activity  change.  Eyes: Negative for visual disturbance.  Respiratory: Negative for shortness of breath.   Cardiovascular: Negative for chest pain.  Gastrointestinal: Negative for abdominal pain.  Musculoskeletal: Positive for arthralgias, myalgias and neck pain. Negative for back pain, gait problem, joint swelling and neck stiffness.  Skin: Negative for color change and rash.  Neurological: Negative for weakness and numbness.     Physical Exam Updated Vital Signs BP (!) 171/85 Comment: states she is out of her bp medication  Pulse 93   Temp 98.6 F (37 C)   Resp 18   Ht  (1.702 m)   Wt 89.4 kg (197 lb)   SpO2 100%   BMI 30.85 kg/m   Physical Exam  Constitutional: No distress.  HENT:  Head:  Normocephalic.  Eyes: Conjunctivae are normal.  Neck: Neck supple.  Cardiovascular: Normal rate and regular rhythm. Exam reveals no gallop and no friction rub.  No murmur heard. Pulmonary/Chest: Effort normal. No respiratory distress.  Abdominal: Soft. She exhibits no distension.  Musculoskeletal: She exhibits tenderness. She exhibits no edema or deformity.  Tender to palpation diffusely over the right wrist.  No obvious deformities, crepitus, step-offs.  She is tender over the right anatomic snuffbox.  No tenderness to the right hand or digits.  No overlying erythema, edema, or ecchymosis.  Right elbow and shoulder are nontender with full and active passive range of motion.  Full active and passive range of motion of all digits of the right hand.  Radial pulses are 2+ and symmetric.  Sensation is intact and symmetric throughout the bilateral upper extremities.  5 out of 5 strength against resistance of the bilateral upper extremities.  Good capillary refill of all digits of the right hand.  No tenderness to palpation to the spinous processes of the cervical, thoracic, or lumbar spine.  She has mild reproducible tenderness to the paraspinal muscles of the cervical spine bilaterally, but all other paraspinal muscles are nontender.  Neurological: She is alert.  Skin: Skin is warm. Capillary refill takes less than 2 seconds. No rash noted.  Psychiatric: Her behavior is normal.  Nursing note and vitals reviewed.  ED Treatments / Results  Labs (all labs ordered are listed, but only abnormal results are displayed) Labs Reviewed - No data to display  EKG None  Radiology Dg Wrist Complete Right  Result Date: 08/19/2017 CLINICAL DATA:  Slip and fall yesterday with wrist pain, initial encounter EXAM: RIGHT WRIST - COMPLETE 3+ VIEW COMPARISON:  None. FINDINGS: No acute fracture or dislocation is noted. No significant soft tissue abnormality is seen. Some widening of the scapholunate space is noted  which may be related to ligamentous injury. No other focal abnormality is seen. IMPRESSION: No acute fracture. Findings suspicious for scapholunate ligament injury. Electronically Signed   By: Alcide Clever M.D.   On: 08/19/2017 17:31    Procedures Procedures (including critical care time)  Medications Ordered in ED Medications - No data to display   Initial Impression / Assessment and Plan / ED Course  I have reviewed the triage vital signs and the nursing notes.  Pertinent labs & imaging results that were available during my care of the patient were reviewed by me and considered in my medical decision making (see chart for details).     47 year old female with a h/o DM Type II, Hepatitis B, Hepatitis C, HTN, and thyroid disease who presents with right wrist pain after mechanical fall yesterday.  On exam, she has tenderness to the  right anatomic snuffbox. She is NVI. Right wrist X-ray with suspicious for scapholunate ligament injury due to widening of the scapholunate space.  On reexamination, she is tender over the scapholunate junction, and does have significant pain with radial deviation.  No definite clunking sensation with scaphoid shift test, which has poor specificity in general.  Pain was treated appropriately with ibuprofen 2 hours prior to arrival.  She is unable to take Tylenol due to her history of hepatitis B and C.  Will place the patient in a thumb spica splint, discharge her with supportive Rx for ibuprofen and Robaxin, and follow-up with orthopedics in 1 week.  Strict return precautions given.  The patient is hemodynamically stable and in no acute distress.  She is safe for discharge home at this time.  Final Clinical Impressions(s) / ED Diagnoses   Final diagnoses:  Right wrist pain    ED Discharge Orders        Ordered    methocarbamol (ROBAXIN) 500 MG tablet  2 times daily     08/19/17 1803    ibuprofen (ADVIL,MOTRIN) 800 MG tablet  3 times daily     08/19/17 1803        Mikiah Durall, Coral Else, PA-C 08/19/17 1813    Raeford Razor, MD 08/20/17 [phone]

## 2017-08-19 NOTE — ED Triage Notes (Signed)
Pt reports slipping and falling yesterday am. C/o right wrist/forearm pain. Denies hitting head/loc.

## 2017-08-19 NOTE — Discharge Instructions (Signed)
Thank you for allowing me to provide your care today in the emergency department.  Call Dr. Johnathan HausenHarris's office when they reopen on Monday to schedule a follow-up appointment for around 7 days from today.  Please wear the splint until you are seen by Dr. Mort SawyersHarrison's office.  You can remove the splint to apply ice for 15 to 20 minutes up to 3-4 times per day to help with pain and swelling.  Take 1 tablet of ibuprofen (800mg ) with food every 8 hours for pain control.  Please do not take other medications such as Aleve, Motrin, naproxen, or other NSAIDs while you are taking this medication.  Take 1 tablet of Robaxin up to 2 times daily to help with muscle pain and spasms.  Return to the emergency department if you have another fall or injury, if your fingertips turn blue, or if you develop significant numbness in the right hand or fingers.

## 2018-03-09 ENCOUNTER — Other Ambulatory Visit: Payer: Self-pay

## 2018-03-09 ENCOUNTER — Encounter (HOSPITAL_COMMUNITY): Payer: Self-pay | Admitting: Emergency Medicine

## 2018-03-09 ENCOUNTER — Emergency Department (HOSPITAL_COMMUNITY)
Admission: EM | Admit: 2018-03-09 | Discharge: 2018-03-09 | Disposition: A | Discharge status: Home / Self Care | Payer: Self-pay | Attending: Emergency Medicine | Admitting: Emergency Medicine

## 2018-03-09 ENCOUNTER — Emergency Department (HOSPITAL_COMMUNITY): Payer: Self-pay

## 2018-03-09 DIAGNOSIS — E119 Type 2 diabetes mellitus without complications: Secondary | ICD-10-CM | POA: Insufficient documentation

## 2018-03-09 DIAGNOSIS — Z79899 Other long term (current) drug therapy: Secondary | ICD-10-CM | POA: Insufficient documentation

## 2018-03-09 DIAGNOSIS — Z87891 Personal history of nicotine dependence: Secondary | ICD-10-CM | POA: Insufficient documentation

## 2018-03-09 DIAGNOSIS — M5442 Lumbago with sciatica, left side: Secondary | ICD-10-CM | POA: Insufficient documentation

## 2018-03-09 DIAGNOSIS — I10 Essential (primary) hypertension: Secondary | ICD-10-CM | POA: Insufficient documentation

## 2018-03-09 DIAGNOSIS — Z794 Long term (current) use of insulin: Secondary | ICD-10-CM | POA: Insufficient documentation

## 2018-03-09 MED ORDER — HYDROMORPHONE HCL 1 MG/ML IJ SOLN
1.0000 mg | Freq: Once | INTRAMUSCULAR | Status: AC
  Administered 2018-03-09: 1 mg via INTRAMUSCULAR
  Filled 2018-03-09: qty 1

## 2018-03-09 MED ORDER — CYCLOBENZAPRINE HCL 10 MG PO TABS: 10.0000 mg | ORAL_TABLET | Freq: Two times a day (BID) | ORAL | 0 refills | Status: DC | PRN

## 2018-03-09 MED ORDER — DICLOFENAC SODIUM 50 MG PO TBEC: 50.0000 mg | DELAYED_RELEASE_TABLET | Freq: Two times a day (BID) | ORAL | 0 refills | Status: DC

## 2018-03-09 MED ORDER — OXYCODONE-ACETAMINOPHEN 5-325 MG PO TABS: 1.0000 | ORAL_TABLET | ORAL | 0 refills | Status: DC | PRN

## 2018-03-09 NOTE — Discharge Instructions (Addendum)
Prescription for pain medicine, muscle relaxer, anti-inflammatory medicine.  Try to get a primary care relationship.  Get in a comfortable position.

## 2018-03-09 NOTE — ED Triage Notes (Signed)
Pt reports she has had chronic back for years but has always been right sided, has had 2 back surgeries, this new back pain is lower left sided and feels "raw" x 3-4 days, pt reports difficulty getting comfortable and getting up and down

## 2018-03-09 NOTE — Clinical Social Work Note (Signed)
CSW met with patient, who expressed interest in getting set up with PCP and getting help for affording medications.  Attempted to call Daine with [facility], no answer.  Left message giving her patient's contact information.  Also gave patient contact information for clinic.

## 2018-03-09 NOTE — ED Notes (Signed)
Pt left with friend

## 2018-03-09 NOTE — ED Notes (Signed)
Pt reports during triage she has taken 4 flexeril she had previously been prescribed and has taken ibuprofen with no relief

## 2018-03-10 NOTE — ED Provider Notes (Signed)
[facility] EMERGENCY DEPARTMENT Provider Note   CSN: [id number] Arrival date & time: 03/09/18  [id number]     History   Chief Complaint Chief Complaint  Patient presents with  . Back Pain    HPI Sandra Parsons is a 47 y.o. female.  Left lower back pain with radiation to the left buttocks for 4 days.  Has had "lower back surgery x2" in the past.  No bowel or bladder incontinence.  No new injury.  Severity is moderate.  She has tried Flexeril and ibuprofen with minimal relief.     Past Medical History:  Diagnosis Date  . Diabetes mellitus without complication (HCC)   . Hepatitis B   . Hepatitis C   . Hypertension   . Thyroid disease     There are no active problems to display for this patient.   Past Surgical History:  Procedure Laterality Date  . BACK SURGERY       OB History   No obstetric history on file.      Home Medications    Prior to Admission medications   Medication Sig Start Date End Date Taking? Authorizing Provider  insulin NPH-regular Human (70-30) 100 UNIT/ML injection Inject 30 Units into the skin 2 (two) times daily. 30 units in the morning and 20 units in the evening   Yes [provider]  cyclobenzaprine (FLEXERIL) 10 MG tablet Take 1 tablet (10 mg total) by mouth 2 (two) times daily as needed for muscle spasms. 03/09/18   Donnetta Hutchingook, Kerington Hildebrant, MD  diclofenac (VOLTAREN) 50 MG EC tablet Take 1 tablet (50 mg total) by mouth 2 (two) times daily. 03/09/18   Donnetta Hutchingook, Delmon Andrada, MD  metFORMIN (GLUCOPHAGE) 500 MG tablet Take 1 tablet (500 mg total) by mouth 2 (two) times daily with a meal. Patient not taking: Reported on 03/09/2018 03/20/16   Triplett, Tammy, PA-C  oxyCODONE-acetaminophen (PERCOCET/ROXICET) 5-325 MG tablet Take 1 tablet by mouth every 4 (four) hours as needed for severe pain. 03/09/18   Donnetta Hutchingook, Doaa Kendzierski, MD    Family History History reviewed. No pertinent family history.  Social History Social History   Tobacco Use  . Smoking status: Former  Smoker    Types: Cigarettes  . Smokeless tobacco: Never Used  Substance Use Topics  . Alcohol use: No  . Drug use: Yes    Types: Marijuana     Allergies   Prednisone   Review of Systems Review of Systems  All other systems reviewed and are negative.    Physical Exam Updated Vital Signs BP (!) 195/86 (BP Location: Left Arm)   Pulse 72   Temp 98 F (36.7 C) (Oral)   Resp 16   Ht 5\' 7"  (1.702 m)   Wt 90.7 kg   SpO2 98%   BMI 31.32 kg/m   Physical Exam Vitals signs and nursing note reviewed.  Constitutional:      Appearance: She is well-developed.  HENT:     Head: Normocephalic and atraumatic.  Eyes:     Conjunctiva/sclera: Conjunctivae normal.  Neck:     Musculoskeletal: Neck supple.  Cardiovascular:     Rate and Rhythm: Normal rate and regular rhythm.  Pulmonary:     Effort: Pulmonary effort is normal.     Breath sounds: Normal breath sounds.  Abdominal:     General: Bowel sounds are normal.     Palpations: Abdomen is soft.  Musculoskeletal:     Comments: Tender left lower back.  Pain with straight leg raise left greater  than right  Skin:    General: Skin is warm and dry.  Neurological:     Mental Status: She is alert and oriented to person, place, and time.  Psychiatric:        Behavior: Behavior normal.      ED Treatments / Results  Labs (all labs ordered are listed, but only abnormal results are displayed) Labs Reviewed - No data to display  EKG None  Radiology Dg Lumbar Spine Complete  Result Date: 03/09/2018 CLINICAL DATA:  Chronic low back pain, tingling in right leg EXAM: LUMBAR SPINE - COMPLETE 4+ VIEW COMPARISON:  None. FINDINGS: Degenerative disc disease in the mid and lower lumbar spine with vacuum disc and spurring at L3-4 and L4-5. Disc space narrowing at L5-S1. Mild degenerative facet disease in the mid and lower lumbar spine. Slight retrolisthesis of L3 on L4 and L4 on L5 related to facet disease. No fracture. SI joints symmetric  and unremarkable. IMPRESSION: Degenerative disc and facet disease in the mid and lower lumbar spine as above. No acute bony abnormality. Electronically Signed   By: Charlett NoseKevin  Dover M.D.   On: 03/09/2018 10:34    Procedures Procedures (including critical care time)  Medications Ordered in ED Medications  HYDROmorphone (DILAUDID) injection 1 mg (1 mg Intramuscular Given 03/09/18 1009)  HYDROmorphone (DILAUDID) injection 1 mg (1 mg Intramuscular Given 03/09/18 1554)     Initial Impression / Assessment and Plan / ED Course  I have reviewed the triage vital signs and the nursing notes.  Pertinent labs & imaging results that were available during my care of the patient were reviewed by me and considered in my medical decision making (see chart for details).     Patient presents with acute on chronic low back pain.  Plain films of lumbar spine reveal degenerative disc and facet disease in the mid and lower lumbar spine.  Patient was given intramuscular Dilaudid in the ED.  Discharge medication Flexeril 10 mg, Voltaren 50 mg, Percocet.  Final Clinical Impressions(s) / ED Diagnoses   Final diagnoses:  Acute left-sided low back pain with left-sided sciatica    ED Discharge Orders         Ordered    cyclobenzaprine (FLEXERIL) 10 MG tablet  2 times daily PRN     03/09/18 1551    diclofenac (VOLTAREN) 50 MG EC tablet  2 times daily     03/09/18 1551    oxyCODONE-acetaminophen (PERCOCET/ROXICET) 5-325 MG tablet  Every 4 hours PRN     03/09/18 1551           Donnetta Hutchingook, Havana Baldwin, MD 03/10/18 1342

## 2019-02-16 ENCOUNTER — Emergency Department (HOSPITAL_COMMUNITY): Payer: Self-pay

## 2019-02-16 ENCOUNTER — Emergency Department (HOSPITAL_COMMUNITY)
Admission: EM | Admit: 2019-02-16 | Discharge: 2019-02-16 | Disposition: A | Discharge status: Home / Self Care | Payer: Self-pay | Attending: Emergency Medicine | Admitting: Emergency Medicine

## 2019-02-16 ENCOUNTER — Encounter (HOSPITAL_COMMUNITY): Payer: Self-pay

## 2019-02-16 ENCOUNTER — Other Ambulatory Visit: Payer: Self-pay

## 2019-02-16 DIAGNOSIS — W19XXXA Unspecified fall, initial encounter: Secondary | ICD-10-CM

## 2019-02-16 DIAGNOSIS — M25551 Pain in right hip: Secondary | ICD-10-CM | POA: Insufficient documentation

## 2019-02-16 DIAGNOSIS — I10 Essential (primary) hypertension: Secondary | ICD-10-CM | POA: Insufficient documentation

## 2019-02-16 DIAGNOSIS — E119 Type 2 diabetes mellitus without complications: Secondary | ICD-10-CM | POA: Insufficient documentation

## 2019-02-16 DIAGNOSIS — Z794 Long term (current) use of insulin: Secondary | ICD-10-CM | POA: Insufficient documentation

## 2019-02-16 DIAGNOSIS — Z87891 Personal history of nicotine dependence: Secondary | ICD-10-CM | POA: Insufficient documentation

## 2019-02-16 MED ORDER — MELOXICAM 15 MG PO TBDP: 15.0000 mg | ORAL_TABLET | Freq: Every day | ORAL | 0 refills | Status: DC | PRN

## 2019-02-16 MED ORDER — METHOCARBAMOL 500 MG PO TABS: 500.0000 mg | ORAL_TABLET | Freq: Three times a day (TID) | ORAL | 0 refills | Status: DC | PRN

## 2019-02-16 MED ORDER — LIDOCAINE 5 % EX PTCH: 1.0000 | MEDICATED_PATCH | CUTANEOUS | 0 refills | Status: DC

## 2019-02-16 NOTE — Discharge Instructions (Addendum)
You were seen in the emergency department for back pain today.  At this time we suspect that your pain is related to a muscle strain/spasm.   I have prescribed you an anti-inflammatory medication and a muscle relaxer.  - Meloxicam is a nonsteroidal anti-inflammatory medication that will help with pain and swelling. Be sure to take this medication as prescribed with food, 1 pill every 24 hours,  It should be taken with food, as it can cause stomach upset, and more seriously, stomach bleeding. Do not take other nonsteroidal anti-inflammatory medications with this such as Advil, Motrin, Aleve, Mobic, Goodie Powder, or Motrin.    - Robaxin is the muscle relaxer I have prescribed, this is meant to help with muscle tightness. Be aware that this medication may make you drowsy therefore the first time you take this it should be at a time you are in an environment where you can rest. Do not drive or operate heavy machinery when taking this medication. Do not drink alcohol or take other sedating medications with this medicine such as narcotics or benzodiazepines.   - Lidoderm patch-apply 1 patch to the most significant area of pain once per day to help numb/to the area. We have prescribed you new medication(s) today. Discuss the medications prescribed today with your pharmacist as they can have adverse effects and interactions with your other medicines including over the counter and prescribed medications. Seek medical evaluation if you start to experience new or abnormal symptoms after taking one of these medicines, seek care immediately if you start to experience difficulty breathing, feeling of your throat closing, facial swelling, or rash as these could be indications of a more serious allergic reaction  The application of heat can help soothe the pain.  Maintaining your daily activities, including walking, is encourged, as it will help you get better faster than just staying in bed.  Follow-up with your  primary care provider and/or with the orthopedic specialist right in your discharge instructions within 1 week for reevaluation.  However return to the ER should you develop ne or worsening symptoms or any other concerns including but not limited to severe or worsening pain, low back pain with fever, numbness, weakness, loss of bowel or bladder control, or inability to walk or urinate, you should return to the ER immediately.      Your blood pressure is elevated- you need to have this rechecked within 1 week as you likely need to be on blood pressure medicines.

## 2019-02-16 NOTE — ED Provider Notes (Signed)
[facility] EMERGENCY DEPARTMENT Provider Note   CSN: [id number] Arrival date & time: 02/16/19  2355     History   Chief Complaint Hip pain  HPI Sandra Parsons is a 48 y.o. female with a hx of T2DM, HTN, & hepatitis who presents to the ED with complaints of right hip pain s/p fall 02/12/19. Patient states she was walking up the stairs, miss-stepped, and fell onto her right side. Denies head injury or LOC. Was able to get up without assistance s/pf all. She has been having pain to the R hip and the R lower back since fall, constant, worse with movement, no alleviating factors, tried taking aleve without much relief. She has hip problems at baseline. States pain sometimes shoots down her leg. Denies numbness, tingling, weakness, or incontinence. Denies other areas of injury.      HPI  Past Medical History:  Diagnosis Date  . Diabetes mellitus without complication ([place])   . Hepatitis B   . Hepatitis C   . Hypertension    Not currently on BP meds  . Thyroid disease     There are no active problems to display for this patient.   Past Surgical History:  Procedure Laterality Date  . BACK SURGERY       OB History   No obstetric history on file.      Home Medications    Prior to Admission medications   Medication Sig Start Date End Date Taking? Authorizing Provider  cyclobenzaprine (FLEXERIL) 10 MG tablet Take 1 tablet (10 mg total) by mouth 2 (two) times daily as needed for muscle spasms. 03/09/18   Nat Christen, MD  diclofenac (VOLTAREN) 50 MG EC tablet Take 1 tablet (50 mg total) by mouth 2 (two) times daily. 03/09/18   Nat Christen, MD  insulin NPH-regular Human (70-30) 100 UNIT/ML injection Inject 30 Units into the skin 2 (two) times daily. 30 units in the morning and 20 units in the evening    [provider]  metFORMIN (GLUCOPHAGE) 500 MG tablet Take 1 tablet (500 mg total) by mouth 2 (two) times daily with a meal. Patient not taking: Reported on 03/09/2018  03/20/16   Triplett, Tammy, PA-C  oxyCODONE-acetaminophen (PERCOCET/ROXICET) 5-325 MG tablet Take 1 tablet by mouth every 4 (four) hours as needed for severe pain. 03/09/18   Nat Christen, MD    Family History No family history on file.  Social History Social History   Tobacco Use  . Smoking status: Former Smoker    Types: Cigarettes  . Smokeless tobacco: Never Used  Substance Use Topics  . Alcohol use: No  . Drug use: Yes    Types: Marijuana     Allergies   Prednisone   Review of Systems Review of Systems  Constitutional: Negative for chills and fever.  Respiratory: Negative for shortness of breath.   Cardiovascular: Negative for chest pain.  Gastrointestinal: Negative for abdominal pain and vomiting.  Musculoskeletal: Positive for arthralgias and back pain. Negative for neck pain.  Skin: Negative for color change and wound.  Neurological: Negative for dizziness, syncope, weakness, light-headedness, numbness and headaches.  All other systems reviewed and are negative.    Physical Exam Updated Vital Signs BP (!) 189/91 (BP Location: Right Arm)   Pulse 74   Temp 98.1 F (36.7 C) (Oral)   Resp 16   SpO2 98%   Physical Exam Vitals signs and nursing note reviewed.  Constitutional:      General: She is not in acute  distress.    Appearance: She is not ill-appearing or toxic-appearing.  HENT:     Head: Normocephalic and atraumatic.  Neck:     Musculoskeletal: Normal range of motion.     Comments: No midline tenderness.  Cardiovascular:     Rate and Rhythm: Normal rate and regular rhythm.     Pulses:          Dorsalis pedis pulses are 2+ on the right side and 2+ on the left side.       Posterior tibial pulses are 2+ on the right side and 2+ on the left side.  Pulmonary:     Effort: Pulmonary effort is normal.     Breath sounds: Normal breath sounds.  Chest:     Chest wall: No tenderness.  Abdominal:     General: There is no distension.     Palpations:  Abdomen is soft.     Tenderness: There is no abdominal tenderness.  Musculoskeletal:     Comments: No obvious deformity, appreciable swelling, edema, erythema, ecchymosis, warmth, or open wounds. Upper extremities: Intact AROM. Nontender.  Back: No midline spinal tenderness. R lumbar paraspinal muscle tenderness extending to R gluteal region.  Lower extremities:  Patient has intact AROM to bilateral hips, knees, ankles, and all digits, discomfort with R hip flexion. Tender to palpation diffusely to the R hip- more so posteriorly. Otherwise nontender. .   Skin:    General: Skin is warm and dry.     Capillary Refill: Capillary refill takes less than 2 seconds.  Neurological:     Mental Status: She is alert.     Comments: Alert. Clear speech. Sensation grossly intact to bilateral lower extremities. 5/5 strength with plantar/dorsiflexion bilaterally. Patient ambulatory with somewhat antalgic gait, no foot drop noted.   Psychiatric:        Mood and Affect: Mood normal.        Behavior: Behavior normal.    ED Treatments / Results  Labs (all labs ordered are listed, but only abnormal results are displayed) Labs Reviewed - No data to display  EKG None  Radiology Dg Hip Unilat W Or Wo Pelvis 2-3 Views Right  Result Date: 02/16/2019 CLINICAL DATA:  Larey Seat on steps a couple days ago, posterior RIGHT hip pain EXAM: DG HIP (WITH OR WITHOUT PELVIS) 2-3V RIGHT COMPARISON:  None FINDINGS: Osseous mineralization normal. Hip and SI joint spaces preserved. Degenerative disc disease changes at visualized lower lumbar spine. No acute fracture, dislocation, or bone destruction. IMPRESSION: No acute osseous abnormalities. Degenerative disc disease changes at visualized lower lumbar spine. Electronically Signed   By: Ulyses Southward M.D.   On: 02/16/2019 11:14    Procedures Procedures (including critical care time)  Medications Ordered in ED Medications - No data to display   Initial Impression /  Assessment and Plan / ED Course  I have reviewed the triage vital signs and the nursing notes.  Pertinent labs & imaging results that were available during my care of the patient were reviewed by me and considered in my medical decision making (see chart for details).   Patient presents to the emergency department status post mechanical fall 02/12/19 with complaints of right-sided hip/back pain.  Patient is nontoxic-appearing, resting comfortably, vitals WNL with the exception of elevated blood pressure, doubt HTN emergency, discussed need for recheck.  No signs of serious head, neck, or back injury.  Do not feel CT head/cervical spine are necessary per [place] trauma rules.  She has no midline spinal  tenderness or focal neurologic deficits.  Do not suspect head bleed or spinal fracture.  No chest/abdominal tenderness to raise concern for intrathoracic/abdominal injury.  Tenderness palpation to the right lumbar paraspinal muscles and right gluteal region as well as to the right hip diffusely, no fracture/dislocation on x-ray of the right hip, there are some degenerative changes noted at the visualized portions of the lumbar spine.  She is neurovascularly intact distally.  She is ambulatory.  Appears appropriate for discharge home with NSAIDs, muscle relaxant, and Lidoderm patch.  Discussed no driving/operating heavy machinery when taking Robaxin.  Work note provided. I discussed results, treatment plan, need for follow-up, and return precautions with the patient. Provided opportunity for questions, patient confirmed understanding and is in agreement with plan.     Final Clinical Impressions(s) / ED Diagnoses   Final diagnoses:  Fall, initial encounter  Right hip pain    ED Discharge Orders         Ordered    methocarbamol (ROBAXIN) 500 MG tablet  Every 8 hours PRN     02/16/19 1305    Meloxicam 15 MG TBDP  Daily PRN     02/16/19 1305    lidocaine (LIDODERM) 5 %  Every 24 hours     02/16/19  1305           Petrucelli, Grey EagleSamantha R, PA-C 02/16/19 1306    Mancel BaleWentz, Elliott, MD 02/17/19 [phone]

## 2019-02-16 NOTE — ED Triage Notes (Signed)
Pt reports falling up steps and landed on right hip 3 days ago. Reports right lower back and right hip pain

## 2019-04-26 ENCOUNTER — Other Ambulatory Visit: Payer: Self-pay

## 2019-04-26 ENCOUNTER — Emergency Department (HOSPITAL_COMMUNITY)
Admission: EM | Admit: 2019-04-26 | Discharge: 2019-04-26 | Disposition: A | Discharge status: Home / Self Care | Payer: Self-pay | Attending: Emergency Medicine | Admitting: Emergency Medicine

## 2019-04-26 ENCOUNTER — Encounter (HOSPITAL_COMMUNITY): Payer: Self-pay | Admitting: Emergency Medicine

## 2019-04-26 DIAGNOSIS — Z79899 Other long term (current) drug therapy: Secondary | ICD-10-CM | POA: Insufficient documentation

## 2019-04-26 DIAGNOSIS — Z87891 Personal history of nicotine dependence: Secondary | ICD-10-CM | POA: Insufficient documentation

## 2019-04-26 DIAGNOSIS — R300 Dysuria: Secondary | ICD-10-CM | POA: Insufficient documentation

## 2019-04-26 DIAGNOSIS — I10 Essential (primary) hypertension: Secondary | ICD-10-CM | POA: Insufficient documentation

## 2019-04-26 LAB — URINALYSIS, ROUTINE W REFLEX MICROSCOPIC
Bacteria, UA: NONE SEEN
Bilirubin Urine: NEGATIVE
Glucose, UA: NEGATIVE mg/dL
Hgb urine dipstick: NEGATIVE
Ketones, ur: NEGATIVE mg/dL
Nitrite: NEGATIVE
Protein, ur: 100 mg/dL — AB
Specific Gravity, Urine: 1.025 (ref 1.005–1.030)
pH: 5 (ref 5.0–8.0)

## 2019-04-26 LAB — PREGNANCY, URINE: Preg Test, Ur: NEGATIVE

## 2019-04-26 LAB — POC URINE PREG, ED: Preg Test, Ur: NEGATIVE

## 2019-04-26 MED ORDER — CEPHALEXIN 500 MG PO CAPS: 500.0000 mg | ORAL_CAPSULE | Freq: Two times a day (BID) | ORAL | 0 refills | Status: DC

## 2019-04-26 NOTE — ED Triage Notes (Signed)
Pain with urination x 1 week

## 2019-04-26 NOTE — ED Provider Notes (Signed)
[facility] EMERGENCY DEPARTMENT Provider Note   CSN: [id number] Arrival date & time: 04/26/19  1551     History Chief Complaint  Patient presents with  . Dysuria    Sandra Parsons is a 49 y.o. female with a history of diabetes mellitus, hypertension, hepatitis C/B, and thyroid disease who presents to the emergency department with complaints of dysuria x 1 week. Patient reports dysuria with each episode of urination as well as some urinary frequency and suprapubic discomfort.  Other than urinating no alleviating or aggravating factors to her symptoms. Tried AZO, water, & cranberry juice without relief.  States this feels somewhat like prior UTIs.  Denies fever, chills, nausea, vomiting, flank pain, hematuria, vaginal bleeding, or vaginal discharge.  Patient states she is not sexually active, has not been in over a year, she is not concerned for STDs.   HPI     Past Medical History:  Diagnosis Date  . Diabetes mellitus without complication ([facility])   . Hepatitis B   . Hepatitis C   . Hypertension    Not currently on BP meds  . Thyroid disease     There are no problems to display for this patient.   Past Surgical History:  Procedure Laterality Date  . BACK SURGERY       OB History   No obstetric history on file.     No family history on file.  Social History   Tobacco Use  . Smoking status: Former Smoker    Types: Cigarettes  . Smokeless tobacco: Never Used  Substance Use Topics  . Alcohol use: No  . Drug use: Yes    Types: Marijuana    Home Medications Prior to Admission medications   Medication Sig Start Date End Date Taking? Authorizing Provider  cyclobenzaprine (FLEXERIL) 10 MG tablet Take 1 tablet (10 mg total) by mouth 2 (two) times daily as needed for muscle spasms. 03/09/18   Donnetta Hutching, MD  diclofenac (VOLTAREN) 50 MG EC tablet Take 1 tablet (50 mg total) by mouth 2 (two) times daily. 03/09/18   Donnetta Hutching, MD  insulin NPH-regular Human (70-30) 100  UNIT/ML injection Inject 30 Units into the skin 2 (two) times daily. 30 units in the morning and 20 units in the evening    [provider]  lidocaine (LIDODERM) 5 % Place 1 patch onto the skin daily. Apply patch directly to area of pain once per day. Remove & Discard patch within 12 hours. 02/16/19   Jaleal Schliep R, PA-C  Meloxicam 15 MG TBDP Take 15 mg by mouth daily as needed (pain). 02/16/19   Norbert Malkin R, PA-C  methocarbamol (ROBAXIN) 500 MG tablet Take 1 tablet (500 mg total) by mouth every 8 (eight) hours as needed for muscle spasms. 02/16/19   Wayne Brunker, Pleas Koch, PA-C  oxyCODONE-acetaminophen (PERCOCET/ROXICET) 5-325 MG tablet Take 1 tablet by mouth every 4 (four) hours as needed for severe pain. 03/09/18   Donnetta Hutching, MD  metFORMIN (GLUCOPHAGE) 500 MG tablet Take 1 tablet (500 mg total) by mouth 2 (two) times daily with a meal. Patient not taking: Reported on 03/09/2018 03/20/16 02/16/19  Pauline Aus, PA-C    Allergies    Prednisone  Review of Systems   Review of Systems  Constitutional: Negative for chills and fever.  Respiratory: Negative for shortness of breath.   Cardiovascular: Negative for chest pain.  Gastrointestinal: Positive for abdominal pain (Suprapubic). Negative for nausea and vomiting.  Genitourinary: Positive for dysuria and frequency. Negative for  flank pain, hematuria, vaginal bleeding and vaginal discharge.    Physical Exam Updated Vital Signs BP (!) 180/97 (BP Location: Right Arm)   Pulse 80   Temp 98.2 F (36.8 C) (Oral)   Resp 16   Ht 5\' 7"  (1.702 m)   Wt 90.7 kg   SpO2 98%   BMI 31.32 kg/m   Physical Exam Vitals and nursing note reviewed.  Constitutional:      General: She is not in acute distress.    Appearance: She is well-developed. She is not toxic-appearing.  HENT:     Head: Normocephalic and atraumatic.  Eyes:     General:        Right eye: No discharge.        Left eye: No discharge.      Conjunctiva/sclera: Conjunctivae normal.  Cardiovascular:     Rate and Rhythm: Normal rate and regular rhythm.  Pulmonary:     Effort: Pulmonary effort is normal. No respiratory distress.     Breath sounds: Normal breath sounds. No wheezing, rhonchi or rales.  Abdominal:     General: There is no distension.     Palpations: Abdomen is soft.     Tenderness: There is abdominal tenderness (Mild suprapubic). There is no right CVA tenderness, left CVA tenderness, guarding or rebound.     Comments: No McBurney's point tenderness.  Genitourinary:    Comments: Offered and patient declined. Musculoskeletal:     Cervical back: Neck supple.  Skin:    General: Skin is warm and dry.     Findings: No rash.  Neurological:     Mental Status: She is alert.     Comments: Clear speech.   Psychiatric:        Behavior: Behavior normal.     ED Results / Procedures / Treatments   Labs (all labs ordered are listed, but only abnormal results are displayed) Labs Reviewed  URINALYSIS, ROUTINE W REFLEX MICROSCOPIC - Abnormal; Notable for the following components:      Result Value   APPearance HAZY (*)    Protein, ur 100 (*)    Leukocytes,Ua TRACE (*)    All other components within normal limits  URINE CULTURE  PREGNANCY, URINE  POC URINE PREG, ED    EKG None  Radiology No results found.  Procedures Procedures (including critical care time)  Medications Ordered in ED Medications - No data to display  ED Course  I have reviewed the triage vital signs and the nursing notes.  Pertinent labs & imaging results that were available during my care of the patient were reviewed by me and considered in my medical decision making (see chart for details).    MDM Rules/Calculators/A&P                      Patient presents to the emergency department with complaints of dysuria with suprapubic pressure.  She is nontoxic, resting comfortably, vitals WNL with exception of elevated blood pressure,  doubt HTN emergency, she has a history of similar blood pressure readings at prior health care visits.  Will need PCP follow-up.  Her urinalysis shows trace leukocytes, not overly convincing for UTI, however given her symptoms we will provide prescription for Keflex and send out culture.  Pregnancy test is negative.  She has no nausea, vomiting, fever, or CVA tenderness to raise concern for pyelonephritis.  No colicky unilateral pain to raise concern for nephrolithiasis, additionally no hematuria.  No focal right lower quadrant tenderness  to palpation, no McBurney's point tenderness, do not suspect appendicitis.  I offered pelvic exam to further assess for GU pathology, patient declined, she would prefer to follow-up with her PCP or OB/GYN for this as she has not been sexually active and is not concerned for STDs.  Will provide prescription for Keflex.  PCP follow-up. I discussed results, treatment plan, need for follow-up, and return precautions with the patient. Provided opportunity for questions, patient confirmed understanding and is in agreement with plan.   Final Clinical Impression(s) / ED Diagnoses Final diagnoses:  Dysuria  Hypertension, unspecified type    Rx / DC Orders ED Discharge Orders         Ordered    cephALEXin (KEFLEX) 500 MG capsule  2 times daily     04/26/19 1758           Cherly Anderson, PA-C 04/26/19 1800    Raeford Razor, MD 04/27/19 1859

## 2019-04-26 NOTE — Discharge Instructions (Addendum)
You were seen in the emergency department today for pain with urination.  Your urine did not appear overly infected but we had sent this for culture and I am covering you for a UTI with Keflex, an antibiotic, please take this as prescribed.  We have prescribed you new medication(s) today. Discuss the medications prescribed today with your pharmacist as they can have adverse effects and interactions with your other medicines including over the counter and prescribed medications. Seek medical evaluation if you start to experience new or abnormal symptoms after taking one of these medicines, seek care immediately if you start to experience difficulty breathing, feeling of your throat closing, facial swelling, or rash as these could be indications of a more serious allergic reaction  Your pregnancy test was negative.  We would like you to follow-up closely with your primary care provider within 3 to 5 days for reevaluation of your symptoms.  As discussed if you are not feeling better a pelvic exam could be beneficial for further evaluation.  Return to the emergency department for new or worsening symptoms including but not limited to increased pain, pain in a new location, fever, vomiting, vaginal bleeding, vaginal discharge, pain in your back, passing out, or any other concerns.  Additionally your blood pressure was elevated in the emergency department, please have this rechecked by your primary care provider  Vitals:   04/26/19 1638  BP: (!) 180/97  Pulse: 80  Resp: 16  Temp: 98.2 F (36.8 C)  SpO2: 98%   If you do not have a primary care provider please see the options below.  [facility] Primary Care Doctor List    Kari Baars MD. Specialty: Pulmonary Disease Contact information: [address]  [address]  [place] [address]  [phone]   Syliva Overman, MD. Specialty: [facility] Medicine Contact information: [address]  [place] [address]  [phone]    Lilyan Punt, MD. Specialty: [facility] Medicine Contact information: [address]  [place] [address]  [phone]   Avon Gully, MD Specialty: Internal Medicine Contact information: [address] [place] [address]  [phone]   Catalina Pizza, MD. Specialty: Internal Medicine Contact information: [address]  [place] [address]  [phone]    [facility] (Dr. Selena Batten) Specialty: Family Medicine Contact information: [address]  [place] [address]  [phone]   John Giovanni, MD. Specialty: [facility] Medicine Contact information: [address]  [address]  [place] [address]  [phone]   Carylon Perches, MD. Specialty: Internal Medicine Contact information: [address]  [address]  [place] [address]  [phone]    [facility]  [address] [place], [place] [address] [phone]  Services [facility] offers a variety of basic health services.  Services include but are not limited to: Blood pressure checks  Heart rate checks  Blood sugar checks  Urine analysis  Rapid strep tests  Pregnancy tests.  Health education and referrals  People needing more complex services will be directed to a physician online. Using these virtual visits, doctors can evaluate and prescribe medicine and treatments. There will be no medication on-site, though [facility] will help patients fill their prescriptions at little to no cost.   For More information please go to: [email or web address]

## 2019-04-27 LAB — URINE CULTURE: Culture: NO GROWTH

## 2019-05-15 ENCOUNTER — Encounter (HOSPITAL_COMMUNITY): Payer: Self-pay | Admitting: Emergency Medicine

## 2019-05-15 ENCOUNTER — Emergency Department (HOSPITAL_COMMUNITY)
Admission: EM | Admit: 2019-05-15 | Discharge: 2019-05-15 | Disposition: A | Discharge status: Home / Self Care | Payer: Self-pay | Attending: Emergency Medicine | Admitting: Emergency Medicine

## 2019-05-15 ENCOUNTER — Emergency Department (HOSPITAL_COMMUNITY): Payer: Self-pay

## 2019-05-15 ENCOUNTER — Other Ambulatory Visit: Payer: Self-pay

## 2019-05-15 DIAGNOSIS — Z87891 Personal history of nicotine dependence: Secondary | ICD-10-CM | POA: Insufficient documentation

## 2019-05-15 DIAGNOSIS — Z794 Long term (current) use of insulin: Secondary | ICD-10-CM | POA: Insufficient documentation

## 2019-05-15 DIAGNOSIS — I10 Essential (primary) hypertension: Secondary | ICD-10-CM | POA: Insufficient documentation

## 2019-05-15 DIAGNOSIS — M5412 Radiculopathy, cervical region: Secondary | ICD-10-CM | POA: Insufficient documentation

## 2019-05-15 DIAGNOSIS — Z79899 Other long term (current) drug therapy: Secondary | ICD-10-CM | POA: Insufficient documentation

## 2019-05-15 DIAGNOSIS — E119 Type 2 diabetes mellitus without complications: Secondary | ICD-10-CM | POA: Insufficient documentation

## 2019-05-15 LAB — CBG MONITORING, ED: Glucose-Capillary: 178 mg/dL — ABNORMAL HIGH (ref 70–99)

## 2019-05-15 MED ORDER — IBUPROFEN 800 MG PO TABS
800.0000 mg | ORAL_TABLET | Freq: Once | ORAL | Status: AC
  Administered 2019-05-15: 800 mg via ORAL
  Filled 2019-05-15: qty 1

## 2019-05-15 MED ORDER — PREDNISONE 50 MG PO TABS
60.0000 mg | ORAL_TABLET | Freq: Once | ORAL | Status: AC
  Administered 2019-05-15: 60 mg via ORAL
  Filled 2019-05-15: qty 1

## 2019-05-15 MED ORDER — METHYLPREDNISOLONE 4 MG PO TBPK: ORAL_TABLET | ORAL | 0 refills | Status: DC

## 2019-05-15 NOTE — ED Provider Notes (Signed)
[facility] Provider Note   CSN: [id number] Arrival date & time: 05/15/19  6144     History Chief Complaint  Patient presents with  . Hand Pain    Sandra Parsons is a 49 y.o. female.  Patient presents with a 2-day history of left sided arm and hand pain.  Pain starts in her left neck and shoulder radiates all the way down to her hand.  It is worse when she cannot open and close her fingers.  She feels numbness and tingling in the tips of her fingers especially the third, fourth and fifth.  She denies any weakness but does have significant pain.  She been using ibuprofen and ice without relief.  She denies any specific injury.  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  She states she had previous back surgery no history of neck issues.  No chest pain or back pain.  No fevers, chills, nausea or vomiting.  No weakness in her hand. She is a diabetic on insulin as well as has a history of hepatitis B and C.  The history is provided by the patient.  Hand Pain Pertinent negatives include no chest pain, no abdominal pain and no headaches.       Past Medical History:  Diagnosis Date  . Diabetes mellitus without complication ([place])   . Hepatitis B   . Hepatitis C   . Hypertension    Not currently on BP meds  . Thyroid disease     There are no problems to display for this patient.   Past Surgical History:  Procedure Laterality Date  . BACK SURGERY       OB History   No obstetric history on file.     History reviewed. No pertinent family history.  Social History   Tobacco Use  . Smoking status: Former Smoker    Types: Cigarettes  . Smokeless tobacco: Never Used  Substance Use Topics  . Alcohol use: No  . Drug use: Yes    Types: Marijuana    Home Medications Prior to Admission medications   Medication Sig Start Date End Date Taking? Authorizing Provider  cephALEXin (KEFLEX) 500 MG capsule Take 1 capsule (500 mg total) by  mouth 2 (two) times daily. 04/26/19   Petrucelli, Samantha R, PA-C  cyclobenzaprine (FLEXERIL) 10 MG tablet Take 1 tablet (10 mg total) by mouth 2 (two) times daily as needed for muscle spasms. 03/09/18   Nat Christen, MD  diclofenac (VOLTAREN) 50 MG EC tablet Take 1 tablet (50 mg total) by mouth 2 (two) times daily. 03/09/18   Nat Christen, MD  insulin NPH-regular Human (70-30) 100 UNIT/ML injection Inject 30 Units into the skin 2 (two) times daily. 30 units in the morning and 20 units in the evening    [provider]  lidocaine (LIDODERM) 5 % Place 1 patch onto the skin daily. Apply patch directly to area of pain once per day. Remove & Discard patch within 12 hours. 02/16/19   Petrucelli, Samantha R, PA-C  Meloxicam 15 MG TBDP Take 15 mg by mouth daily as needed (pain). 02/16/19   Petrucelli, Samantha R, PA-C  methocarbamol (ROBAXIN) 500 MG tablet Take 1 tablet (500 mg total) by mouth every 8 (eight) hours as needed for muscle spasms. 02/16/19   Petrucelli, Glynda Jaeger, PA-C  oxyCODONE-acetaminophen (PERCOCET/ROXICET) 5-325 MG tablet Take 1 tablet by mouth every 4 (four) hours as needed for severe pain. 03/09/18  Donnetta Hutching, MD  metFORMIN (GLUCOPHAGE) 500 MG tablet Take 1 tablet (500 mg total) by mouth 2 (two) times daily with a meal. Patient not taking: Reported on 03/09/2018 03/20/16 02/16/19  Pauline Aus, PA-C    Allergies    Prednisone  Review of Systems   Review of Systems  Constitutional: Negative for activity change, appetite change, fatigue and fever.  HENT: Negative for congestion and rhinorrhea.   Eyes: Negative for visual disturbance.  Respiratory: Negative for chest tightness.   Cardiovascular: Negative for chest pain and palpitations.  Gastrointestinal: Negative for abdominal pain, nausea and vomiting.  Genitourinary: Negative for dysuria and hematuria.  Musculoskeletal: Positive for arthralgias, myalgias and neck pain.  Skin: Negative for rash.  Neurological:  Negative for dizziness, weakness and headaches.   all other systems are negative except as noted in the HPI and PMH.    Physical Exam Updated Vital Signs BP (!) 148/100 (BP Location: Right Arm)   Pulse 93   Temp 98.3 F (36.8 C) (Oral)   Resp 18   Ht 5\' 7"  (1.702 m)   Wt 90.7 kg   SpO2 99%   BMI 31.32 kg/m   Physical Exam Vitals and nursing note reviewed.  Constitutional:      General: She is not in acute distress.    Appearance: Normal appearance. She is well-developed and normal weight.  HENT:     Head: Normocephalic and atraumatic.     Mouth/Throat:     Pharynx: No oropharyngeal exudate.  Eyes:     Conjunctiva/sclera: Conjunctivae normal.     Pupils: Pupils are equal, round, and reactive to light.  Neck:     Comments: No meningismus. Left paraspinal cervical tenderness tenderness across her trapezius.  There is no midline tenderness Cardiovascular:     Rate and Rhythm: Normal rate and regular rhythm.     Heart sounds: Normal heart sounds. No murmur.  Pulmonary:     Effort: Pulmonary effort is normal. No respiratory distress.     Breath sounds: Normal breath sounds.  Abdominal:     Palpations: Abdomen is soft.     Tenderness: There is no abdominal tenderness. There is no guarding or rebound.  Musculoskeletal:        General: Normal range of motion.     Cervical back: Normal range of motion and neck supple. Tenderness present.     Comments: Equal grip strength with encouragement.  Forearm flexion extension strong bilaterally.  Able to shrug shoulders, abduction and adduction shoulders. Intact radial pulse, intact cardinal hand movements  Skin:    General: Skin is warm.     Capillary Refill: Capillary refill takes less than 2 seconds.  Neurological:     General: No focal deficit present.     Mental Status: She is alert and oriented to person, place, and time. Mental status is at baseline.     Cranial Nerves: No cranial nerve deficit.     Motor: No abnormal muscle  tone.     Coordination: Coordination normal.     Comments: No ataxia on finger to nose bilaterally. No pronator drift. 5/5 strength throughout. CN 2-12 intact.Equal grip strength. Sensation intact.   Psychiatric:        Behavior: Behavior normal.     ED Results / Procedures / Treatments   Labs (all labs ordered are listed, but only abnormal results are displayed) Labs Reviewed  CBG MONITORING, ED - Abnormal; Notable for the following components:      Result Value  Glucose-Capillary 178 (*)    All other components within normal limits    EKG None  Radiology DG Cervical Spine Complete  Result Date: 05/15/2019 CLINICAL DATA:  Cervical radiculopathy. EXAM: CERVICAL SPINE - COMPLETE 4+ VIEW COMPARISON:  12/25/2013. FINDINGS: Diffuse multilevel degenerative change. No acute abnormality identified. No evidence of fracture. No evidence of dislocation. Pulmonary apices are clear. IMPRESSION: Diffuse multilevel degenerative change. No acute abnormality identified. Electronically Signed   By: Maisie Fus  Register   On: 05/15/2019 10:05    Procedures Procedures (including critical care time)  Medications Ordered in ED Medications  ibuprofen (ADVIL) tablet 800 mg (has no administration in time range)  predniSONE (DELTASONE) tablet 60 mg (has no administration in time range)    ED Course  I have reviewed the triage vital signs and the nursing notes.  Pertinent labs & imaging results that were available during my care of the patient were reviewed by me and considered in my medical decision making (see chart for details).    MDM Rules/Calculators/A&P                      Left hand and arm pain with suspicion for cervical radiculopathy.  She has no weakness on exam.  Her chart lists an allergy to prednisone.  She states this is because it causes hyperglycemia.  Discussed risks and benefits of starting prednisone including side effect of hyperglycemia which she accepts at this time.  She  drove herself on cannot receive narcotic pain medication  X-ray shows multilevel degenerative change without acute abnormality.  We will treat supportively for suspected cervical radiculopathy with steroids.  Advised to watch her blood sugars closely.  Follow-up with PCP.  Return to the ED with worsening pain, weakness, numbness, tingling, any other concerns.  Discussion likely needs an MRI of her cervical spine.  She should return to the ED if develop weakness in the arm or any numbness or tingling or difficulty using the arm. Final Clinical Impression(s) / ED Diagnoses Final diagnoses:  Cervical radiculopathy    Rx / DC Orders ED Discharge Orders    None       Jshawn Hurta, Jeannett Senior, MD 05/15/19 [phone]

## 2019-05-15 NOTE — ED Triage Notes (Signed)
Patient complaining of pain from left shoulder down to left hand. States hand hurts worse and hurts to open or close it since yesterday at work.

## 2019-05-15 NOTE — Discharge Instructions (Signed)
Take the steroids as prescribed.  Follow-up with your doctor.  As we discussed, you may need an MRI of your neck. return to the ED with worsening pain, weakness, numbness, tingling, difficulty moving the arm or any other concerns.

## 2019-05-18 ENCOUNTER — Telehealth: Payer: Self-pay

## 2019-06-14 ENCOUNTER — Ambulatory Visit: Payer: Self-pay | Admitting: Physician Assistant

## 2019-06-14 ENCOUNTER — Encounter: Payer: Self-pay | Admitting: Physician Assistant

## 2019-06-14 VITALS — BP 130/70 | HR 72 | Temp 97.3°F | Wt 203.8 lb

## 2019-06-14 DIAGNOSIS — E1165 Type 2 diabetes mellitus with hyperglycemia: Secondary | ICD-10-CM

## 2019-06-14 DIAGNOSIS — Z8639 Personal history of other endocrine, nutritional and metabolic disease: Secondary | ICD-10-CM

## 2019-06-14 DIAGNOSIS — L309 Dermatitis, unspecified: Secondary | ICD-10-CM

## 2019-06-14 DIAGNOSIS — Z8619 Personal history of other infectious and parasitic diseases: Secondary | ICD-10-CM

## 2019-06-14 DIAGNOSIS — Z7689 Persons encountering health services in other specified circumstances: Secondary | ICD-10-CM

## 2019-06-14 DIAGNOSIS — M5412 Radiculopathy, cervical region: Secondary | ICD-10-CM

## 2019-06-14 MED ORDER — JANUMET 50-500 MG PO TABS: 1.0000 | ORAL_TABLET | Freq: Two times a day (BID) | ORAL | 0 refills | Status: DC

## 2019-06-14 MED ORDER — TRIAMCINOLONE ACETONIDE 0.025 % EX OINT: 1.0000 "application " | TOPICAL_OINTMENT | Freq: Every evening | CUTANEOUS | 1 refills | Status: DC | PRN

## 2019-06-14 MED ORDER — LANTUS SOLOSTAR 100 UNIT/ML ~~LOC~~ SOPN: 10.0000 [IU] | PEN_INJECTOR | Freq: Every day | SUBCUTANEOUS | 99 refills | Status: DC

## 2019-06-14 NOTE — Progress Notes (Signed)
BP 130/70   Pulse 72   Temp (!) 97.3 F (36.3 C)   Wt 203 lb 12.8 oz (92.4 kg)   LMP 04/26/2016   SpO2 97%   BMI 31.92 kg/m    Subjective:    Patient ID: Sandra Parsons, female    DOB: [birth year], 49 y.o.   MRN: [id number]  HPI: Sandra Parsons is a 49 y.o. female presenting on 06/14/2019 for New Patient (Initial Visit) (pt has not had a PCP for 6 years since she moved to [facility]. pt states she moved from [facility], [place] 4 years ago)   HPI  \ Pt had negative covid 19 screening questionnaire.     Pt is 53yoF who presents today to establish care.  She has DM.  Today bs was 100.    Pt says he mostly came because the ER told her she probably pinched nerve.    She works at [employer].  She stands a lot and looks downward.   Pt says she has numbness and tingling in both hands.  She says she has neuropathy because of having DM since age 2.    Numb and tingling in hands for about 4 weeks.    Never had mammogram  Last PAP about 2 years ago in [place] [facility].   It was normal  History of hep C and Hep B.    She gets pill from her sister she thinks is combo Metformin and glyburide but she doesn't know the strenght and she has only 1 left and 70/30 is almost gone.   Next day off is she doesn't know when.       Relevant past medical, surgical, family and social history reviewed and updated as indicated. Interim medical history since our last visit reviewed. Allergies and medications reviewed and updated.   Current Outpatient Medications:  .  insulin NPH-regular Human (70-30) 100 UNIT/ML injection, Inject 25 Units into the skin daily. 25 units daily, Disp: , Rfl:  .  METFORMIN HCL PO, Take by mouth. Metformin combination pt unsure what other medication is., Disp: , Rfl:  .  naproxen sodium (ALEVE) 220 MG tablet, Take 440 mg by mouth daily as needed., Disp: , Rfl:      Review of Systems  Per HPI unless specifically indicated above     Objective:    BP 130/70    Pulse 72   Temp (!) 97.3 F (36.3 C)   Wt 203 lb 12.8 oz (92.4 kg)   LMP 04/26/2016   SpO2 97%   BMI 31.92 kg/m   Wt Readings from Last 3 Encounters:  06/14/19 203 lb 12.8 oz (92.4 kg)  05/15/19 200 lb (90.7 kg)  04/26/19 200 lb (90.7 kg)    Physical Exam Vitals reviewed.  Constitutional:      General: She is not in acute distress.    Appearance: Normal appearance. She is well-developed. She is obese. She is not ill-appearing.  HENT:     Head: Normocephalic and atraumatic.  Eyes:     Conjunctiva/sclera: Conjunctivae normal.     Pupils: Pupils are equal, round, and reactive to light.  Neck:     Thyroid: No thyromegaly.  Cardiovascular:     Rate and Rhythm: Normal rate and regular rhythm.  Pulmonary:     Effort: Pulmonary effort is normal.     Breath sounds: Normal breath sounds.  Abdominal:     General: Bowel sounds are normal.     Palpations: Abdomen is soft. There is  no mass.     Tenderness: There is no abdominal tenderness.  Musculoskeletal:     Cervical back: Neck supple.     Right lower leg: No edema.     Left lower leg: No edema.  Lymphadenopathy:     Cervical: No cervical adenopathy.  Skin:    General: Skin is warm and dry.     Comments: Palms of both hands thickened and peeling with some hyperpigmentation.  No secondary infection  Neurological:     Mental Status: She is alert and oriented to person, place, and time.     Gait: Gait normal.  Psychiatric:        Attention and Perception: Attention normal.        Speech: Speech normal.        Behavior: Behavior normal. Behavior is cooperative.         Assessment & Plan:    Encounter Diagnoses  Name Primary?  . Encounter to establish care Yes  . Type 2 diabetes mellitus with hyperglycemia, unspecified whether long term insulin use (HCC)   . History of hepatitis C   . History of hepatitis B   . History of thyroid disease   . Dermatitis   . Cervical radiculopathy       -pt to Get fasting labs  next day off  -discussed with pt that it is diffucutl to adjust meds but she has only 1 pill remaining and not much insulin so she is given sample janumet 50/500 and lantus to start at 10u qhs.  Pt is to moniotr fbs.  She is to call office for fbs < 70 or >300. -will refer for screening Mammogram -TAC for hands.  She can use OTC aquaphor at other times -discussed with pt will Wait on CAFA / financial assistance before referral to orthopedics or MRI.  She is in agreement.  [facility] notified and will assist pt with process -DM  foot exam done today -will put on list for annual DM eye exam -she is scheduled for covid vacciination -pt will follow up 2 weeks.  She will bring bs log.  She is to contact office sooner prn

## 2019-06-14 NOTE — Patient Instructions (Signed)
COVID VACCINATION-  3:55pm Tuesday July 03, 2019    -----------------------------------------------------   Insulin Injection Instructions, Using Insulin Pens, Adult A subcutaneous injection is a shot of medicine that is injected into the layer of fat and tissue between skin and muscle. People with type 1 diabetes must take insulin because their bodies do not make it. People with type 2 diabetes may need to take insulin. There are many different types of insulin. The type of insulin that you take may determine how many injections you give yourself and when you need to give the injections. Supplies needed:  Soap and water to wash hands.  Your insulin pen.  A new, unused needle.  Alcohol wipes.  A disposal container that is meant for sharp items (sharps container), such as an empty plastic bottle with a cover. How to choose a site for injection The body absorbs insulin differently, depending on where the insulin is injected (injection site). It is best to inject insulin into the same body area each time (for example, always in the abdomen), but you should use a different spot in that area for each injection. Do not inject the insulin in the same spot each time. There are five main areas that can be used for injecting. These areas include:  Abdomen. This is the preferred area.  Front of thigh.  Upper, outer side of thigh.  Upper, outer side of arm.  Upper, outer part of buttock. How to use an insulin pen  First, follow the steps for Get ready, then continue with the steps for Inject the insulin. Get ready 1. Wash your hands with soap and water. If soap and water are not available, use hand sanitizer. 2. Before you give yourself an insulin injection, be sure to test your blood sugar level (blood glucose level) and write down that number. Follow any instructions from your health care provider about what to do if your blood glucose level is higher or lower than your normal  range. 3. Check the expiration date and the type of insulin that is in the pen. 4. If you are using CLEAR insulin, check to see that it is clear and free of clumps. 5. If you are using CLOUDY insulin, do not shake the pen to get the injection ready. Instead, get it ready in one of these ways: ? Gently roll the pen between your palms several times. ? Tip the pen up and down several times. 6. Remove the cap from the insulin pen. 7. Use an alcohol wipe to clean the rubber tip of the pen. 8. Remove the protective paper tab from the disposable needle. Do not let the needle touch anything. 9. Screw a new, unused needle onto the pen. 10. Remove the outer plastic needle cover. Do not throw away the outer plastic cover yet. ? If the pen uses a special safety needle, leave the inner needle shield in place. ? If the pen does not use a special safety needle, remove the inner plastic cover from the needle. 11. Follow the manufacturer's instructions to prime the insulin pen with the volume of insulin needed. Hold the pen with the needle pointing up, and push the button on the opposite end of the pen until a drop of insulin appears at the needle tip. If no insulin appears, repeat this step. 12. Turn the button (dial) to the number of units of insulin that you will be injecting. Inject the insulin 1. Use an alcohol wipe to clean the site where you will  be injecting the needle. Let the site air-dry. 2. Hold the pen in the palm of your writing hand like a pencil. 3. If directed by your health care provider, use your other hand to pinch and hold about an inch (2.5 cm) of skin at the injection site. Do not directly touch the cleaned part of the skin. 4. Gently but quickly, use your writing hand to put the needle straight into the skin. The needle should be at a 90-degree angle (perpendicular) to the skin. 5. When the needle is completely inserted into the skin, use your thumb or index finger of your writing hand to  push the top button of the pen down all the way to inject the insulin. 6. Let go of the skin that you are pinching. Continue to hold the pen in place with your writing hand. 7. Wait 10 seconds, then pull the needle straight out of the skin. This will allow all of the insulin to go from the pen and needle into your body. 8. Carefully put the larger (outer) plastic cover of the needle back over the needle, then unscrew the capped needle and discard it in a sharps container, such as an empty plastic bottle with a cover. 9. Put the plastic cap back on the insulin pen. How to throw away supplies  Discard all used needles in a puncture-proof sharps disposal container. You can ask your local pharmacy about where you can get this kind of disposal container, or you can use an empty plastic liquid laundry detergent bottle that has a cover.  Follow the disposal regulations for the area where you live. Do not use any needle more than one time.  Throw away empty disposable pens in the regular trash. Questions to ask your health care provider  How often should I be taking insulin?  How often should I check my blood glucose?  What amount of insulin should I be taking at each time?  What are the side effects?  What should I do if my blood glucose is too high?  What should I do if my blood glucose is too low?  What should I do if I forget to take my insulin?  What number should I call if I have questions? Where to find more information  American Diabetes Association (ADA): www.diabetes.org  American Association of Diabetes Educators (AADE) Patient Resources: https://www.diabeteseducator.org Summary  A subcutaneous injection is a shot of medicine that is injected into the layer of fat and tissue between skin and muscle.  Before you give yourself an insulin injection, be sure to test your blood sugar level (blood glucose level) and write down that number.  Check the expiration date and the type  of insulin that is in the pen. The type of insulin that you take may determine how many injections you give yourself and when you need to give the injections.  It is best to inject insulin into the same body area each time (for example, always in the abdomen), but you should use a different spot in that area for each injection. This information is not intended to replace advice given to you by your health care provider. Make sure you discuss any questions you have with your health care provider. Document Revised: 03/28/2017 Document Reviewed: 04/11/2015 Elsevier Patient Education  2020 [employer].

## 2019-06-18 ENCOUNTER — Other Ambulatory Visit (HOSPITAL_COMMUNITY)
Admission: RE | Admit: 2019-06-18 | Discharge: 2019-06-18 | Disposition: A | Discharge status: Home / Self Care | Payer: Self-pay | Source: Ambulatory Visit | Attending: Physician Assistant | Admitting: Physician Assistant

## 2019-06-18 DIAGNOSIS — Z8639 Personal history of other endocrine, nutritional and metabolic disease: Secondary | ICD-10-CM | POA: Insufficient documentation

## 2019-06-18 DIAGNOSIS — E1165 Type 2 diabetes mellitus with hyperglycemia: Secondary | ICD-10-CM | POA: Insufficient documentation

## 2019-06-18 DIAGNOSIS — Z8619 Personal history of other infectious and parasitic diseases: Secondary | ICD-10-CM | POA: Insufficient documentation

## 2019-06-18 LAB — LIPID PANEL
Cholesterol: 255 mg/dL — ABNORMAL HIGH (ref 0–200)
HDL: 36 mg/dL — ABNORMAL LOW (ref >40)
LDL Cholesterol: 154 mg/dL — ABNORMAL HIGH (ref 0–99)
Total CHOL/HDL Ratio: 7.1 RATIO
Triglycerides: 326 mg/dL — ABNORMAL HIGH (ref <150)
VLDL: 65 mg/dL — ABNORMAL HIGH (ref 0–40)

## 2019-06-18 LAB — HEPATITIS B SURFACE ANTIGEN: Hepatitis B Surface Ag: NONREACTIVE

## 2019-06-18 LAB — COMPREHENSIVE METABOLIC PANEL
ALT: 26 U/L (ref 0–44)
AST: 21 U/L (ref 15–41)
Albumin: 3.9 g/dL (ref 3.5–5.0)
Alkaline Phosphatase: 70 U/L (ref 38–126)
Anion gap: 9 (ref 5–15)
BUN: 32 mg/dL — ABNORMAL HIGH (ref 6–20)
CO2: 22 mmol/L (ref 22–32)
Calcium: 9.6 mg/dL (ref 8.9–10.3)
Chloride: 105 mmol/L (ref 98–111)
Creatinine, Ser: 1.31 mg/dL — ABNORMAL HIGH (ref 0.44–1.00)
GFR calc Af Amer: 56 mL/min — ABNORMAL LOW (ref >60)
GFR calc non Af Amer: 48 mL/min — ABNORMAL LOW (ref >60)
Glucose, Bld: 318 mg/dL — ABNORMAL HIGH (ref 70–99)
Potassium: 4.4 mmol/L (ref 3.5–5.1)
Sodium: 136 mmol/L (ref 135–145)
Total Bilirubin: 0.5 mg/dL (ref 0.3–1.2)
Total Protein: 8 g/dL (ref 6.5–8.1)

## 2019-06-18 LAB — HEMOGLOBIN A1C
Hgb A1c MFr Bld: 7.6 % — ABNORMAL HIGH (ref 4.8–5.6)
Mean Plasma Glucose: 171.42 mg/dL

## 2019-06-18 LAB — HEPATITIS C ANTIBODY: HCV Ab: REACTIVE — AB

## 2019-06-18 LAB — TSH: TSH: 37.795 u[IU]/mL — ABNORMAL HIGH (ref 0.350–4.500)

## 2019-06-19 LAB — MICROALBUMIN, URINE: Microalb, Ur: 1577.1 ug/mL — ABNORMAL HIGH

## 2019-06-21 ENCOUNTER — Telehealth: Payer: Self-pay | Admitting: Student

## 2019-06-21 NOTE — Telephone Encounter (Signed)
Pt called stating this morning 06-21-19 her FBS was 311,  yesterday 3-31-2 FBS 300, on 06-19-19 FBS 298. Pt states last night she ate a late supper having a ham, egg and cheese biscuit from [employer] with a water to drink. Pt states she was instructed by PA to call the office if her sugars run over 300. Pt states she is currently on 10 units of Lantus QHS.  LPN informed pt that PA is out of the office, but will send her a message regarding this. Pt was also informed that she has f/u appt scheduled for next week on Tuesday, 06-27-19. LPN informed pt that PA may want to discuss her blood sugar levels at her f/u appt, but LPN will call her if PA wants to make adjustments before. Pt verbalized understanding.  LPN advised pt to be mindful of her food choices as carbs and sugary drinks and foods can cause her blood sugar to be elevated. Pt verbalized understanding.

## 2019-06-26 ENCOUNTER — Other Ambulatory Visit: Payer: Self-pay

## 2019-06-26 ENCOUNTER — Ambulatory Visit: Payer: Self-pay | Admitting: Physician Assistant

## 2019-06-26 ENCOUNTER — Encounter: Payer: Self-pay | Admitting: Physician Assistant

## 2019-06-26 VITALS — BP 134/78 | HR 83 | Temp 97.9°F | Ht 67.0 in | Wt 202.0 lb

## 2019-06-26 DIAGNOSIS — N189 Chronic kidney disease, unspecified: Secondary | ICD-10-CM

## 2019-06-26 DIAGNOSIS — E1165 Type 2 diabetes mellitus with hyperglycemia: Secondary | ICD-10-CM

## 2019-06-26 DIAGNOSIS — B1920 Unspecified viral hepatitis C without hepatic coma: Secondary | ICD-10-CM

## 2019-06-26 DIAGNOSIS — E669 Obesity, unspecified: Secondary | ICD-10-CM

## 2019-06-26 DIAGNOSIS — E785 Hyperlipidemia, unspecified: Secondary | ICD-10-CM

## 2019-06-26 DIAGNOSIS — E039 Hypothyroidism, unspecified: Secondary | ICD-10-CM

## 2019-06-26 MED ORDER — LANTUS SOLOSTAR 100 UNIT/ML ~~LOC~~ SOPN: 15.0000 [IU] | PEN_INJECTOR | Freq: Every day | SUBCUTANEOUS | 99 refills | Status: DC

## 2019-06-26 MED ORDER — ATORVASTATIN CALCIUM 20 MG PO TABS: 20.0000 mg | ORAL_TABLET | Freq: Every day | ORAL | 1 refills | Status: DC

## 2019-06-26 MED ORDER — LEVOTHYROXINE SODIUM 50 MCG PO TABS: 50.0000 ug | ORAL_TABLET | Freq: Every day | ORAL | 1 refills | Status: DC

## 2019-06-26 MED ORDER — JANUMET 50-500 MG PO TABS: 1.0000 | ORAL_TABLET | Freq: Two times a day (BID) | ORAL | 1 refills | Status: DC

## 2019-06-26 NOTE — Patient Instructions (Signed)
Diabetes Mellitus and Nutrition, Adult When you have diabetes (diabetes mellitus), it is very important to have healthy eating habits because your blood sugar (glucose) levels are greatly affected by what you eat and drink. Eating healthy foods in the appropriate amounts, at about the same times every day, can help you:  Control your blood glucose.  Lower your risk of heart disease.  Improve your blood pressure.  Reach or maintain a healthy weight. Every person with diabetes is different, and each person has different needs for a meal plan. Your health care provider may recommend that you work with a diet and nutrition specialist (dietitian) to make a meal plan that is best for you. Your meal plan may vary depending on factors such as:  The calories you need.  The medicines you take.  Your weight.  Your blood glucose, blood pressure, and cholesterol levels.  Your activity level.  Other health conditions you have, such as heart or kidney disease. How do carbohydrates affect me? Carbohydrates, also called carbs, affect your blood glucose level more than any other type of food. Eating carbs naturally raises the amount of glucose in your blood. Carb counting is a method for keeping track of how many carbs you eat. Counting carbs is important to keep your blood glucose at a healthy level, especially if you use insulin or take certain oral diabetes medicines. It is important to know how many carbs you can safely have in each meal. This is different for every person. Your dietitian can help you calculate how many carbs you should have at each meal and for each snack. Foods that contain carbs include:  Bread, cereal, rice, pasta, and crackers.  Potatoes and corn.  Peas, beans, and lentils.  Milk and yogurt.  Fruit and juice.  Desserts, such as cakes, cookies, ice cream, and candy. How does alcohol affect me? Alcohol can cause a sudden decrease in blood glucose (hypoglycemia),  especially if you use insulin or take certain oral diabetes medicines. Hypoglycemia can be a life-threatening condition. Symptoms of hypoglycemia (sleepiness, dizziness, and confusion) are similar to symptoms of having too much alcohol. If your health care provider says that alcohol is safe for you, follow these guidelines:  Limit alcohol intake to no more than 1 drink per day for nonpregnant women and 2 drinks per day for men. One drink equals 12 oz of beer, 5 oz of wine, or 1 oz of hard liquor.  Do not drink on an empty stomach.  Keep yourself hydrated with water, diet soda, or unsweetened iced tea.  Keep in mind that regular soda, juice, and other mixers may contain a lot of sugar and must be counted as carbs. What are tips for following this plan?  Reading food labels  Start by checking the serving size on the "Nutrition Facts" label of packaged foods and drinks. The amount of calories, carbs, fats, and other nutrients listed on the label is based on one serving of the item. Many items contain more than one serving per package.  Check the total grams (g) of carbs in one serving. You can calculate the number of servings of carbs in one serving by dividing the total carbs by 15. For example, if a food has 30 g of total carbs, it would be equal to 2 servings of carbs.  Check the number of grams (g) of saturated and trans fats in one serving. Choose foods that have low or no amount of these fats.  Check the number of   milligrams (mg) of salt (sodium) in one serving. Most people should limit total sodium intake to less than 2,300 mg per day.  Always check the nutrition information of foods labeled as "low-fat" or "nonfat". These foods may be higher in added sugar or refined carbs and should be avoided.  Talk to your dietitian to identify your daily goals for nutrients listed on the label. Shopping  Avoid buying canned, premade, or processed foods. These foods tend to be high in fat, sodium,  and added sugar.  Shop around the outside edge of the grocery store. This includes fresh fruits and vegetables, bulk grains, fresh meats, and fresh dairy. Cooking  Use low-heat cooking methods, such as baking, instead of high-heat cooking methods like deep frying.  Cook using healthy oils, such as olive, canola, or sunflower oil.  Avoid cooking with butter, cream, or high-fat meats. Meal planning  Eat meals and snacks regularly, preferably at the same times every day. Avoid going long periods of time without eating.  Eat foods high in fiber, such as fresh fruits, vegetables, beans, and whole grains. Talk to your dietitian about how many servings of carbs you can eat at each meal.  Eat 4-6 ounces (oz) of lean protein each day, such as lean meat, chicken, fish, eggs, or tofu. One oz of lean protein is equal to: ? 1 oz of meat, chicken, or fish. ? 1 egg. ?  cup of tofu.  Eat some foods each day that contain healthy fats, such as avocado, nuts, seeds, and fish. Lifestyle  Check your blood glucose regularly.  Exercise regularly as told by your health care provider. This may include: ? 150 minutes of moderate-intensity or vigorous-intensity exercise each week. This could be brisk walking, biking, or water aerobics. ? Stretching and doing strength exercises, such as yoga or weightlifting, at least 2 times a week.  Take medicines as told by your health care provider.  Do not use any products that contain nicotine or tobacco, such as cigarettes and e-cigarettes. If you need help quitting, ask your health care provider.  Work with a counselor or diabetes educator to identify strategies to manage stress and any emotional and social challenges. Questions to ask a health care provider  Do I need to meet with a diabetes educator?  Do I need to meet with a dietitian?  What number can I call if I have questions?  When are the best times to check my blood glucose? Where to find more  information:  American Diabetes Association: diabetes.org  Academy of Nutrition and Dietetics: www.eatright.org  National Institute of Diabetes and Digestive and Kidney Diseases (NIH): www.niddk.nih.gov Summary  A healthy meal plan will help you control your blood glucose and maintain a healthy lifestyle.  Working with a diet and nutrition specialist (dietitian) can help you make a meal plan that is best for you.  Keep in mind that carbohydrates (carbs) and alcohol have immediate effects on your blood glucose levels. It is important to count carbs and to use alcohol carefully. This information is not intended to replace advice given to you by your health care provider. Make sure you discuss any questions you have with your health care provider. Document Revised: 02/18/2017 Document Reviewed: 04/12/2016 Elsevier Patient Education  2020 Elsevier Inc.   High Cholesterol  High cholesterol is a condition in which the blood has high levels of a white, waxy, fat-like substance (cholesterol). The human body needs small amounts of cholesterol. The liver makes all the cholesterol that   the body needs. Extra (excess) cholesterol comes from the food that we eat. Cholesterol is carried from the liver by the blood through the blood vessels. If you have high cholesterol, deposits (plaques) may build up on the walls of your blood vessels (arteries). Plaques make the arteries narrower and stiffer. Cholesterol plaques increase your risk for heart attack and stroke. Work with your health care provider to keep your cholesterol levels in a healthy range. What increases the risk? This condition is more likely to develop in people who:  Eat foods that are high in animal fat (saturated fat) or cholesterol.  Are overweight.  Are not getting enough exercise.  Have a family history of high cholesterol. What are the signs or symptoms? There are no symptoms of this condition. How is this diagnosed? This  condition may be diagnosed from the results of a blood test.  If you are older than age 20, your health care provider may check your cholesterol every 4-6 years.  You may be checked more often if you already have high cholesterol or other risk factors for heart disease. The blood test for cholesterol measures:  "Bad" cholesterol (LDL cholesterol). This is the main type of cholesterol that causes heart disease. The desired level for LDL is less than 100.  "Good" cholesterol (HDL cholesterol). This type helps to protect against heart disease by cleaning the arteries and carrying the LDL away. The desired level for HDL is 60 or higher.  Triglycerides. These are fats that the body can store or burn for energy. The desired number for triglycerides is lower than 150.  Total cholesterol. This is a measure of the total amount of cholesterol in your blood, including LDL cholesterol, HDL cholesterol, and triglycerides. A healthy number is less than 200. How is this treated? This condition is treated with diet changes, lifestyle changes, and medicines. Diet changes  This may include eating more whole grains, fruits, vegetables, nuts, and fish.  This may also include cutting back on red meat and foods that have a lot of added sugar. Lifestyle changes  Changes may include getting at least 40 minutes of aerobic exercise 3 times a week. Aerobic exercises include walking, biking, and swimming. Aerobic exercise along with a healthy diet can help you maintain a healthy weight.  Changes may also include quitting smoking. Medicines  Medicines are usually given if diet and lifestyle changes have failed to reduce your cholesterol to healthy levels.  Your health care provider may prescribe a statin medicine. Statin medicines have been shown to reduce cholesterol, which can reduce the risk of heart disease. Follow these instructions at home: Eating and drinking If told by your health care provider:  Eat  chicken (without skin), fish, veal, shellfish, ground turkey breast, and round or loin cuts of red meat.  Do not eat fried foods or fatty meats, such as hot dogs and salami.  Eat plenty of fruits, such as apples.  Eat plenty of vegetables, such as broccoli, potatoes, and carrots.  Eat beans, peas, and lentils.  Eat grains such as barley, rice, couscous, and bulgur wheat.  Eat pasta without cream sauces.  Use skim or nonfat milk, and eat low-fat or nonfat yogurt and cheeses.  Do not eat or drink whole milk, cream, ice cream, egg yolks, or hard cheeses.  Do not eat stick margarine or tub margarines that contain trans fats (also called partially hydrogenated oils).  Do not eat saturated tropical oils, such as coconut oil and palm oil.    Do not eat cakes, cookies, crackers, or other baked goods that contain trans fats.  General instructions  Exercise as directed by your health care provider. Increase your activity level with activities such as gardening, walking, and taking the stairs.  Take over-the-counter and prescription medicines only as told by your health care provider.  Do not use any products that contain nicotine or tobacco, such as cigarettes and e-cigarettes. If you need help quitting, ask your health care provider.  Keep all follow-up visits as told by your health care provider. This is important. Contact a health care provider if:  You are struggling to maintain a healthy diet or weight.  You need help to start on an exercise program.  You need help to stop smoking. Get help right away if:  You have chest pain.  You have trouble breathing. This information is not intended to replace advice given to you by your health care provider. Make sure you discuss any questions you have with your health care provider. Document Revised: 03/11/2017 Document Reviewed: 09/06/2015 Elsevier Patient Education  2020 Elsevier Inc.   

## 2019-06-26 NOTE — Progress Notes (Signed)
BP 134/78   Pulse 83   Temp 97.9 F (36.6 C)   Ht 5\' 7"  (1.702 m)   Wt 202 lb (91.6 kg)   LMP 04/26/2016   SpO2 96%   BMI 31.64 kg/m    Subjective:    Patient ID: Sandra Parsons, female    DOB: 08-07-1970, 49 y.o.   MRN: [id number]  HPI: Sandra Parsons is a 49 y.o. female presenting on 06/26/2019 for No chief complaint on file.   HPI   Pt had a negative covid 19 screening questionnaire,   Pt is 21yoF with DM who was started on insulin two weeks ago and presents for follow up.  She has her bs log-  High-    Relevant past medical, surgical, family and social history reviewed and updated as indicated. Interim medical history since our last visit reviewed. Allergies and medications reviewed and updated.   Current Outpatient Medications:  .  insulin glargine (LANTUS SOLOSTAR) 100 UNIT/ML Solostar Pen, Inject 10 Units into the skin daily., Disp: 1 pen, Rfl: PRN .  naproxen sodium (ALEVE) 220 MG tablet, Take 440 mg by mouth daily as needed., Disp: , Rfl:  .  sitaGLIPtin-metformin (JANUMET) 50-500 MG tablet, Take 1 tablet by mouth 2 (two) times daily with a meal., Disp: 28 tablet, Rfl: 0 .  triamcinolone (KENALOG) 0.025 % ointment, Apply 1 application topically at bedtime as needed., Disp: 15 g, Rfl: 1    Review of Systems  Per HPI unless specifically indicated above     Objective:    BP 134/78   Pulse 83   Temp 97.9 F (36.6 C)   Ht 5\' 7"  (1.702 m)   Wt 202 lb (91.6 kg)   LMP 04/26/2016   SpO2 96%   BMI 31.64 kg/m   Wt Readings from Last 3 Encounters:  06/26/19 202 lb (91.6 kg)  06/14/19 203 lb 12.8 oz (92.4 kg)  05/15/19 200 lb (90.7 kg)    Physical Exam Vitals reviewed.  Constitutional:      General: She is not in acute distress.    Appearance: She is well-developed. She is not ill-appearing.  HENT:     Head: Normocephalic and atraumatic.  Cardiovascular:     Rate and Rhythm: Normal rate and regular rhythm.  Pulmonary:     Effort: Pulmonary effort  is normal.     Breath sounds: Normal breath sounds.  Abdominal:     General: Bowel sounds are normal.     Palpations: Abdomen is soft. There is no mass.     Tenderness: There is no abdominal tenderness.  Musculoskeletal:     Cervical back: Neck supple.     Right lower leg: No edema.     Left lower leg: No edema.  Lymphadenopathy:     Cervical: No cervical adenopathy.  Skin:    General: Skin is warm and dry.  Neurological:     Mental Status: She is alert and oriented to person, place, and time.  Psychiatric:        Behavior: Behavior normal.     Results for orders placed or performed during the hospital encounter of 06/18/19  Hepatitis C Antibody  Result Value Ref Range   HCV Ab Reactive (A) NON REACTIVE  Hepatitis B Surface AntiGEN  Result Value Ref Range   Hepatitis B Surface Ag NON REACTIVE NON REACTIVE  TSH  Result Value Ref Range   TSH 37.795 (H) 0.350 - 4.500 uIU/mL  Microalbumin, urine  Result Value Ref Range  Microalb, Ur 1,577.1 (H) Not Estab. ug/mL  Lipid panel  Result Value Ref Range   Cholesterol 255 (H) 0 - 200 mg/dL   Triglycerides 893 (H) <150 mg/dL   HDL 36 (L) >81 mg/dL   Total CHOL/HDL Ratio 7.1 RATIO   VLDL 65 (H) 0 - 40 mg/dL   LDL Cholesterol 017 (H) 0 - 99 mg/dL  Hemoglobin P1W  Result Value Ref Range   Hgb A1c MFr Bld 7.6 (H) 4.8 - 5.6 %   Mean Plasma Glucose 171.42 mg/dL  Comprehensive metabolic panel  Result Value Ref Range   Sodium 136 135 - 145 mmol/L   Potassium 4.4 3.5 - 5.1 mmol/L   Chloride 105 98 - 111 mmol/L   CO2 22 22 - 32 mmol/L   Glucose, Bld 318 (H) 70 - 99 mg/dL   BUN 32 (H) 6 - 20 mg/dL   Creatinine, Ser 2.58 (H) 0.44 - 1.00 mg/dL   Calcium 9.6 8.9 - 52.7 mg/dL   Total Protein 8.0 6.5 - 8.1 g/dL   Albumin 3.9 3.5 - 5.0 g/dL   AST 21 15 - 41 U/L   ALT 26 0 - 44 U/L   Alkaline Phosphatase 70 38 - 126 U/L   Total Bilirubin 0.5 0.3 - 1.2 mg/dL   GFR calc non Af Amer 48 (L) >60 mL/min   GFR calc Af Amer 56 (L) >60  mL/min   Anion gap 9 5 - 15      Assessment & Plan:    Encounter Diagnoses  Name Primary?  Marland Kitchen Uncontrolled type 2 diabetes mellitus with hyperglycemia (HCC) Yes  . Hyperlipidemia, unspecified hyperlipidemia type   . Hypothyroidism, unspecified type   . Hepatitis C virus infection without hepatic coma, unspecified chronicity   . Chronic kidney disease, unspecified CKD stage   . Obesity, unspecified classification, unspecified obesity type, unspecified whether serious comorbidity present      -reviewed labs with pt -Add levothyroxine -Increase lantus to 15u.  She is to continue to monitor her bs and she is reminded to contact office for fbs < 70 or > 300 -Refer to GI for hepatitis -pt was given CAFA/application for [facility] financial assistance -will Add statin.  Pt counseled on lowfat diet -will Monitor kidney function -pt is put on Dental list -refer for annual DM eye exam -screening mammogram has been ordered -Pt already has appointment for covid vaccination -pt to follow up 1 month with [facility] to review bs log,  She is to contact office sooner prn

## 2019-07-18 ENCOUNTER — Encounter: Payer: Self-pay | Admitting: Internal Medicine

## 2019-07-26 ENCOUNTER — Ambulatory Visit: Payer: Self-pay | Admitting: Physician Assistant

## 2019-07-26 ENCOUNTER — Encounter: Payer: Self-pay | Admitting: Physician Assistant

## 2019-07-26 DIAGNOSIS — E1165 Type 2 diabetes mellitus with hyperglycemia: Secondary | ICD-10-CM

## 2019-07-26 NOTE — Progress Notes (Signed)
   LMP 04/26/2016    Subjective:    Patient ID: Sandra Parsons, female    DOB: 09-05-1970, 49 y.o.   MRN: [id number]  HPI: Sandra Parsons is a 49 y.o. female presenting on 07/26/2019 for No chief complaint on file.   HPI   This is a telemedicine appointment through [employer] due to coronavirus pandemic.    I connected with  Avereigh Herda on 07/26/19 by a video enabled telemedicine application and verified that I am speaking with the correct person using two identifiers.   I discussed the limitations of evaluation and management by telemedicine. The patient expressed understanding and agreed to proceed.  Pt is in her parked car.  Provider is at office.   Pt is a very pleasant 48yoF with diabetes who has follow up today to review blood sugar logs.  She is doing well and is having no problems with her medications.   bs-  Running   250, 275.  No lows.  Lowest was 230.      Relevant past medical, surgical, family and social history reviewed and updated as indicated. Interim medical history since our last visit reviewed. Allergies and medications reviewed and updated.   Current Outpatient Medications:  .  atorvastatin (LIPITOR) 20 MG tablet, Take 1 tablet (20 mg total) by mouth daily., Disp: 90 tablet, Rfl: 1 .  insulin glargine (LANTUS SOLOSTAR) 100 UNIT/ML Solostar Pen, Inject 15 Units into the skin daily., Disp: 1 pen, Rfl: PRN .  levothyroxine (SYNTHROID) 50 MCG tablet, Take 1 tablet (50 mcg total) by mouth daily., Disp: 90 tablet, Rfl: 1 .  naproxen sodium (ALEVE) 220 MG tablet, Take 440 mg by mouth daily as needed., Disp: , Rfl:  .  sitaGLIPtin-metformin (JANUMET) 50-500 MG tablet, Take 1 tablet by mouth 2 (two) times daily with a meal., Disp: 180 tablet, Rfl: 1 .  triamcinolone (KENALOG) 0.025 % ointment, Apply 1 application topically at bedtime as needed., Disp: 15 g, Rfl: 1    Review of Systems  Per HPI unless specifically indicated above     Objective:    LMP 04/26/2016    Wt Readings from Last 3 Encounters:  06/26/19 202 lb (91.6 kg)  06/14/19 203 lb 12.8 oz (92.4 kg)  05/15/19 200 lb (90.7 kg)    Physical Exam Constitutional:      General: She is not in acute distress.    Appearance: She is not ill-appearing.  HENT:     Head: Normocephalic and atraumatic.  Pulmonary:     Effort: No respiratory distress.  Neurological:     Mental Status: She is alert and oriented to person, place, and time.  Psychiatric:        Attention and Perception: Attention normal.        Mood and Affect: Mood normal.        Speech: Speech normal.        Behavior: Behavior is cooperative.           Assessment & Plan:    Encounter Diagnosis  Name Primary?  Marland Kitchen Uncontrolled type 2 diabetes mellitus with hyperglycemia (HCC) Yes    -Increase to 25units lantus -pt to Continue to monitor bs.   She is reminded to Call office for fbs < 70 or > 300 -pt to follow up 1 month to review bs log.  She is to contact office sooner prn

## 2019-08-08 ENCOUNTER — Ambulatory Visit: Payer: Self-pay | Admitting: Gastroenterology

## 2019-08-21 ENCOUNTER — Ambulatory Visit: Payer: Self-pay | Admitting: Physician Assistant

## 2019-08-21 ENCOUNTER — Other Ambulatory Visit: Payer: Self-pay

## 2019-08-21 ENCOUNTER — Encounter: Payer: Self-pay | Admitting: Physician Assistant

## 2019-08-21 VITALS — BP 160/80 | HR 79 | Temp 98.1°F | Ht 67.0 in | Wt 199.3 lb

## 2019-08-21 DIAGNOSIS — N189 Chronic kidney disease, unspecified: Secondary | ICD-10-CM

## 2019-08-21 DIAGNOSIS — L309 Dermatitis, unspecified: Secondary | ICD-10-CM

## 2019-08-21 DIAGNOSIS — E785 Hyperlipidemia, unspecified: Secondary | ICD-10-CM

## 2019-08-21 DIAGNOSIS — I10 Essential (primary) hypertension: Secondary | ICD-10-CM

## 2019-08-21 DIAGNOSIS — E039 Hypothyroidism, unspecified: Secondary | ICD-10-CM

## 2019-08-21 DIAGNOSIS — E1165 Type 2 diabetes mellitus with hyperglycemia: Secondary | ICD-10-CM

## 2019-08-21 MED ORDER — LANTUS SOLOSTAR 100 UNIT/ML ~~LOC~~ SOPN: 30.0000 [IU] | PEN_INJECTOR | Freq: Every day | SUBCUTANEOUS | 99 refills | Status: DC

## 2019-08-21 MED ORDER — LISINOPRIL 10 MG PO TABS: 10.0000 mg | ORAL_TABLET | Freq: Every day | ORAL | 3 refills | Status: DC

## 2019-08-21 NOTE — Progress Notes (Signed)
BP (!) 160/80   Pulse 79   Temp 98.1 F (36.7 C)   Ht 5\' 7"  (0.865 m)   Wt 199 lb 4.8 oz (90.4 kg)   LMP 04/26/2016   SpO2 98%   BMI 31.21 kg/m    Subjective:    Patient ID: Sandra Parsons, female    DOB: [birth year], 49 y.o.   MRN: [id number]  HPI: Sandra Parsons is a 49 y.o. female presenting on 08/21/2019 for Diabetes   HPI   Pt had a negative covid 19 screening questionnaire.   Pt is 64yoF with DM.  She has appt with GI in July for hepatitis.   She is currently Using 25u lantus.  BS have improved but are still running 170 and up when she is fasting.  No lows.    She feels well but is very worried today about a co-worker who was in [facility] and is hospitalized   The TAC 0.025 was working well for her hands but recently the hands are worse.      Relevant past medical, surgical, family and social history reviewed and updated as indicated. Interim medical history since our last visit reviewed. Allergies and medications reviewed and updated.   Current Outpatient Medications:  .  atorvastatin (LIPITOR) 20 MG tablet, Take 1 tablet (20 mg total) by mouth daily., Disp: 90 tablet, Rfl: 1 .  insulin glargine (LANTUS SOLOSTAR) 100 UNIT/ML Solostar Pen, Inject 15 Units into the skin daily. (Patient taking differently: Inject 25 Units into the skin daily. ), Disp: 1 pen, Rfl: PRN .  levothyroxine (SYNTHROID) 50 MCG tablet, Take 1 tablet (50 mcg total) by mouth daily., Disp: 90 tablet, Rfl: 1 .  naproxen sodium (ALEVE) 220 MG tablet, Take 440 mg by mouth daily as needed., Disp: , Rfl:  .  sitaGLIPtin-metformin (JANUMET) 50-500 MG tablet, Take 1 tablet by mouth 2 (two) times daily with a meal., Disp: 180 tablet, Rfl: 1 .  triamcinolone (KENALOG) 0.025 % ointment, Apply 1 application topically at bedtime as needed., Disp: 15 g, Rfl: 1    Review of Systems  Per HPI unless specifically indicated above     Objective:    BP (!) 160/80   Pulse 79   Temp 98.1 F (36.7 C)   Ht 5\' 7"   (1.702 m)   Wt 199 lb 4.8 oz (90.4 kg)   LMP 04/26/2016   SpO2 98%   BMI 31.21 kg/m   Wt Readings from Last 3 Encounters:  08/21/19 199 lb 4.8 oz (90.4 kg)  06/26/19 202 lb (91.6 kg)  06/14/19 203 lb 12.8 oz (92.4 kg)    Physical Exam Vitals reviewed.  Constitutional:      General: She is not in acute distress.    Appearance: She is well-developed. She is obese. She is not ill-appearing.  HENT:     Head: Normocephalic and atraumatic.  Cardiovascular:     Rate and Rhythm: Normal rate and regular rhythm.  Pulmonary:     Effort: Pulmonary effort is normal.     Breath sounds: Normal breath sounds.  Abdominal:     General: Bowel sounds are normal.     Palpations: Abdomen is soft. There is no mass.     Tenderness: There is no abdominal tenderness.  Musculoskeletal:     Cervical back: Neck supple.     Right lower leg: No edema.     Left lower leg: No edema.  Lymphadenopathy:     Cervical: No cervical adenopathy.  Skin:  General: Skin is warm and dry.     Comments: Hands peeling and cracking.  No secondary infection  Neurological:     Mental Status: She is alert and oriented to person, place, and time.  Psychiatric:        Attention and Perception: Attention normal.        Speech: Speech normal.        Behavior: Behavior normal. Behavior is cooperative.           Assessment & Plan:    Encounter Diagnoses  Name Primary?  Marland Kitchen Uncontrolled type 2 diabetes mellitus with hyperglycemia (HCC) Yes  . Hyperlipidemia, unspecified hyperlipidemia type   . Hypothyroidism, unspecified type   . Chronic kidney disease, unspecified CKD stage   . Dermatitis   . Essential hypertension      -Increase to 30 units lantus.  She is to continue to monitor her fbs -add lisinopril 10 -gave rx TAC 0.1% ointment .  Was unable to find it in Epic except under hospital administration so she was given handwritten Rx -pt to follow up 1 month.  She is to contact office sooner prn

## 2019-08-22 ENCOUNTER — Other Ambulatory Visit: Payer: Self-pay | Admitting: Student

## 2019-08-22 DIAGNOSIS — Z1231 Encounter for screening mammogram for malignant neoplasm of breast: Secondary | ICD-10-CM

## 2019-09-07 ENCOUNTER — Other Ambulatory Visit: Payer: Self-pay

## 2019-09-07 ENCOUNTER — Ambulatory Visit (HOSPITAL_COMMUNITY)
Admission: RE | Admit: 2019-09-07 | Discharge: 2019-09-07 | Disposition: A | Discharge status: Home / Self Care | Payer: Self-pay | Source: Ambulatory Visit | Attending: Physician Assistant | Admitting: Physician Assistant

## 2019-09-07 DIAGNOSIS — Z1231 Encounter for screening mammogram for malignant neoplasm of breast: Secondary | ICD-10-CM | POA: Insufficient documentation

## 2019-09-20 ENCOUNTER — Other Ambulatory Visit (HOSPITAL_COMMUNITY)
Admission: RE | Admit: 2019-09-20 | Discharge: 2019-09-20 | Disposition: A | Discharge status: Home / Self Care | Payer: Self-pay | Source: Ambulatory Visit | Attending: Physician Assistant | Admitting: Physician Assistant

## 2019-09-20 ENCOUNTER — Other Ambulatory Visit: Payer: Self-pay

## 2019-09-20 DIAGNOSIS — E039 Hypothyroidism, unspecified: Secondary | ICD-10-CM | POA: Insufficient documentation

## 2019-09-20 DIAGNOSIS — E1165 Type 2 diabetes mellitus with hyperglycemia: Secondary | ICD-10-CM | POA: Insufficient documentation

## 2019-09-20 DIAGNOSIS — N189 Chronic kidney disease, unspecified: Secondary | ICD-10-CM | POA: Insufficient documentation

## 2019-09-20 DIAGNOSIS — E785 Hyperlipidemia, unspecified: Secondary | ICD-10-CM | POA: Insufficient documentation

## 2019-09-20 LAB — COMPREHENSIVE METABOLIC PANEL
ALT: 29 U/L (ref 0–44)
AST: 28 U/L (ref 15–41)
Albumin: 4 g/dL (ref 3.5–5.0)
Alkaline Phosphatase: 51 U/L (ref 38–126)
Anion gap: 9 (ref 5–15)
BUN: 47 mg/dL — ABNORMAL HIGH (ref 6–20)
CO2: 20 mmol/L — ABNORMAL LOW (ref 22–32)
Calcium: 9.6 mg/dL (ref 8.9–10.3)
Chloride: 110 mmol/L (ref 98–111)
Creatinine, Ser: 1.41 mg/dL — ABNORMAL HIGH (ref 0.44–1.00)
GFR calc Af Amer: 51 mL/min — ABNORMAL LOW (ref >60)
GFR calc non Af Amer: 44 mL/min — ABNORMAL LOW (ref >60)
Glucose, Bld: 150 mg/dL — ABNORMAL HIGH (ref 70–99)
Potassium: 4.3 mmol/L (ref 3.5–5.1)
Sodium: 139 mmol/L (ref 135–145)
Total Bilirubin: 0.8 mg/dL (ref 0.3–1.2)
Total Protein: 7.8 g/dL (ref 6.5–8.1)

## 2019-09-20 LAB — LIPID PANEL
Cholesterol: 161 mg/dL (ref 0–200)
HDL: 36 mg/dL — ABNORMAL LOW (ref >40)
LDL Cholesterol: 99 mg/dL (ref 0–99)
Total CHOL/HDL Ratio: 4.5 RATIO
Triglycerides: 129 mg/dL (ref <150)
VLDL: 26 mg/dL (ref 0–40)

## 2019-09-20 LAB — HEMOGLOBIN A1C
Hgb A1c MFr Bld: 7.1 % — ABNORMAL HIGH (ref 4.8–5.6)
Mean Plasma Glucose: 157.07 mg/dL

## 2019-09-20 LAB — TSH: TSH: 12.044 u[IU]/mL — ABNORMAL HIGH (ref 0.350–4.500)

## 2019-09-25 ENCOUNTER — Ambulatory Visit: Payer: Self-pay | Admitting: Physician Assistant

## 2019-09-25 ENCOUNTER — Encounter: Payer: Self-pay | Admitting: Physician Assistant

## 2019-09-25 VITALS — BP 130/70 | HR 75 | Temp 97.7°F | Ht 67.0 in | Wt 191.5 lb

## 2019-09-25 DIAGNOSIS — I10 Essential (primary) hypertension: Secondary | ICD-10-CM

## 2019-09-25 DIAGNOSIS — E118 Type 2 diabetes mellitus with unspecified complications: Secondary | ICD-10-CM

## 2019-09-25 DIAGNOSIS — N189 Chronic kidney disease, unspecified: Secondary | ICD-10-CM

## 2019-09-25 DIAGNOSIS — L309 Dermatitis, unspecified: Secondary | ICD-10-CM

## 2019-09-25 DIAGNOSIS — E785 Hyperlipidemia, unspecified: Secondary | ICD-10-CM

## 2019-09-25 DIAGNOSIS — E039 Hypothyroidism, unspecified: Secondary | ICD-10-CM

## 2019-09-25 DIAGNOSIS — B1920 Unspecified viral hepatitis C without hepatic coma: Secondary | ICD-10-CM

## 2019-09-25 MED ORDER — LEVOTHYROXINE SODIUM 75 MCG PO TABS: 75.0000 ug | ORAL_TABLET | Freq: Every day | ORAL | 3 refills | Status: DC

## 2019-09-25 NOTE — Progress Notes (Signed)
BP 130/70   Pulse 75   Temp 97.7 F (36.5 C)   Ht 5\' 7"  (1.702 m)   Wt 191 lb 8 oz (86.9 kg)   LMP 04/26/2016   SpO2 97%   BMI 29.99 kg/m    Subjective:    Patient ID: 06/24/2016, female    DOB: April 26, 1970, 49 y.o.   MRN: [id number]  HPI: Sandra Parsons is a 49 y.o. female presenting on 09/25/2019 for Hypertension and Diabetes   HPI   Pt had a negative covid 19 screening questionnaire.     Pt is 48yoF with DM, htn, dyslipidemia, hypothyroidism and hepatitis C.   She is still working at 11/26/2019 and says that is good.  She has GI appt later this month for her hepatitis.  She Got covid vaccination both doses.  She is watching what she eats and is losing some weight.   She is monitoring her bs.  She says the TAC helped her hands but that the rash still waxes and wanes.  she has no complaints today.      Relevant past medical, surgical, family and social history reviewed and updated as indicated. Interim medical history since our last visit reviewed. Allergies and medications reviewed and updated.   Current Outpatient Medications:  .  atorvastatin (LIPITOR) 20 MG tablet, Take 1 tablet (20 mg total) by mouth daily., Disp: 90 tablet, Rfl: 1 .  insulin glargine (LANTUS SOLOSTAR) 100 UNIT/ML Solostar Pen, Inject 30 Units into the skin daily., Disp: 1 pen, Rfl: PRN .  levothyroxine (SYNTHROID) 50 MCG tablet, Take 1 tablet (50 mcg total) by mouth daily., Disp: 90 tablet, Rfl: 1 .  lisinopril (ZESTRIL) 10 MG tablet, Take 1 tablet (10 mg total) by mouth daily., Disp: 30 tablet, Rfl: 3 .  naproxen sodium (ALEVE) 220 MG tablet, Take 440 mg by mouth daily as needed., Disp: , Rfl:  .  sitaGLIPtin-metformin (JANUMET) 50-500 MG tablet, Take 1 tablet by mouth 2 (two) times daily with a meal., Disp: 180 tablet, Rfl: 1 .  triamcinolone (KENALOG) 0.025 % ointment, Apply 1 application topically at bedtime as needed., Disp: 15 g, Rfl: 1     Review of Systems  Per HPI unless specifically  indicated above     Objective:    BP 130/70   Pulse 75   Temp 97.7 F (36.5 C)   Ht 5\' 7"  (1.702 m)   Wt 191 lb 8 oz (86.9 kg)   LMP 04/26/2016   SpO2 97%   BMI 29.99 kg/m   Wt Readings from Last 3 Encounters:  09/25/19 191 lb 8 oz (86.9 kg)  08/21/19 199 lb 4.8 oz (90.4 kg)  06/26/19 202 lb (91.6 kg)    Physical Exam Vitals reviewed.  Constitutional:      Appearance: She is well-developed.  HENT:     Head: Normocephalic and atraumatic.  Cardiovascular:     Rate and Rhythm: Normal rate and regular rhythm.  Pulmonary:     Effort: Pulmonary effort is normal.     Breath sounds: Normal breath sounds.  Abdominal:     General: Bowel sounds are normal.     Palpations: Abdomen is soft. There is no mass.     Tenderness: There is no abdominal tenderness.  Musculoskeletal:     Cervical back: Neck supple.     Right lower leg: No edema.     Left lower leg: No edema.  Lymphadenopathy:     Cervical: No cervical adenopathy.  Skin:  General: Skin is warm and dry.     Findings: Rash present.     Comments: rash on hands improved.  R>L.  No secondary infection  Neurological:     Mental Status: She is alert and oriented to person, place, and time.     Gait: Gait normal.  Psychiatric:        Attention and Perception: Attention normal.        Mood and Affect: Mood normal.        Behavior: Behavior normal. Behavior is cooperative.     Results for orders placed or performed during the hospital encounter of 09/20/19  TSH  Result Value Ref Range   TSH 12.044 (H) 0.350 - 4.500 uIU/mL  Hemoglobin A1c  Result Value Ref Range   Hgb A1c MFr Bld 7.1 (H) 4.8 - 5.6 %   Mean Plasma Glucose 157.07 mg/dL  Lipid panel  Result Value Ref Range   Cholesterol 161 0 - 200 mg/dL   Triglycerides 431 <540 mg/dL   HDL 36 (L) >08 mg/dL   Total CHOL/HDL Ratio 4.5 RATIO   VLDL 26 0 - 40 mg/dL   LDL Cholesterol 99 0 - 99 mg/dL  Comprehensive metabolic panel  Result Value Ref Range   Sodium 139  135 - 145 mmol/L   Potassium 4.3 3.5 - 5.1 mmol/L   Chloride 110 98 - 111 mmol/L   CO2 20 (L) 22 - 32 mmol/L   Glucose, Bld 150 (H) 70 - 99 mg/dL   BUN 47 (H) 6 - 20 mg/dL   Creatinine, Ser 6.76 (H) 0.44 - 1.00 mg/dL   Calcium 9.6 8.9 - 19.5 mg/dL   Total Protein 7.8 6.5 - 8.1 g/dL   Albumin 4.0 3.5 - 5.0 g/dL   AST 28 15 - 41 U/L   ALT 29 0 - 44 U/L   Alkaline Phosphatase 51 38 - 126 U/L   Total Bilirubin 0.8 0.3 - 1.2 mg/dL   GFR calc non Af Amer 44 (L) >60 mL/min   GFR calc Af Amer 51 (L) >60 mL/min   Anion gap 9 5 - 15      Assessment & Plan:   Encounter Diagnoses  Name Primary?  . Controlled diabetes mellitus type 2 with complications, unspecified whether long term insulin use (HCC) Yes  . Essential hypertension   . Hyperlipidemia, unspecified hyperlipidemia type   . Hypothyroidism, unspecified type   . Chronic kidney disease, unspecified CKD stage   . Dermatitis   . Hepatitis C virus infection without hepatic coma, unspecified chronicity       -reviewed labs with pt -Increase levothyroxine.  Continue other meds.   -pt encouraged to continue healthy diet and regular exercise -Will update PAP at f/u in 3 months.  She is to contact office sooner prn

## 2019-10-04 ENCOUNTER — Ambulatory Visit: Payer: Self-pay | Admitting: Gastroenterology

## 2019-11-19 ENCOUNTER — Other Ambulatory Visit: Payer: Self-pay | Admitting: Physician Assistant

## 2019-11-19 MED ORDER — LISINOPRIL 10 MG PO TABS: 10.0000 mg | ORAL_TABLET | Freq: Every day | ORAL | 3 refills | Status: DC

## 2019-12-01 NOTE — Progress Notes (Signed)
Referring Provider: Soyla Dryer, PA-C Primary Care Physician:  Soyla Dryer, PA-C Primary Gastroenterologist:  Dr. Abbey Chatters  Chief Complaint  Patient presents with  . Hepatitis C    has not had treatment    HPI:   Sandra Parsons is a 49 y.o. female presenting today at the request of Soyla Dryer, PA-C for Hep C.   Reviewed labs.  Hepatitis C antibody reactive 06/18/2019.  Hepatitis B surface antigen was nonreactive.  LFTs within normal limits.  Last abdominal imaging on file is an ultrasound abdomen from February 2018 revealing fatty infiltration of the liver.  Today:  Had Hep B when she was 49 years old. States it was secondary to a medication she was taking. She was placed on Concerta medication while in the hospital at that time but no known treatment for hepatitis B.  Remembers testing positive for Hep C about 5-6 years ago.   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 Has tried pain pills in the past but these make her very sick. No heroin use. Last used drugs was 4 years ago. She does still smoke marijuana daily. Has been in prison in [place]. She has been out for about 3 years.  No significant alcohol use. Has tattoos, received at a parlor.  No swelling in abdomen or LE. No jaundice. No bruising or bleeding. No confusion or change in mental status.   No abdominal pain regularly. Takes aleve twice daily for arthritis and a pinched nerve in her neck. If she takes a little extra, she will have a little abdominal pain. No tylenol.   Morning nausea with vomiting. About 3 days a week. This has been present for years. No hematuria. No lingering day time symptoms. No GERD symptoms. Notes nausea/vomiting seems to be correlated with eating late in the evening. She has been working on weight loss to gain better control diabetes. Drinks a meal replacement then eats dinner. She is very proud of this. She used to eat a lot of fast food/carbs.    Trying to cut back on NSAIDs using OTC pain patches. Menthol and icy hot.   No dysphagia. BMs daily to every other day. BMs usually soft and formed. No blood in the stool or black stools.   Past Medical History:  Diagnosis Date  . Depression   . Diabetes mellitus without complication ([place])   . Hepatitis B    per patient- diagnosed at age 49  . Hepatitis C   . Hyperlipidemia   . Hypertension    Not currently on BP meds  . Pinched nerve   . Thyroid disease     Past Surgical History:  Procedure Laterality Date  . BACK SURGERY     x2  . CYSTECTOMY     L forearm    Current Outpatient Medications  Medication Sig Dispense Refill  . atorvastatin (LIPITOR) 20 MG tablet Take 1 tablet (20 mg total) by mouth daily. 90 tablet 1  . insulin glargine (LANTUS SOLOSTAR) 100 UNIT/ML Solostar Pen Inject 30 Units into the skin daily. 1 pen PRN  . levothyroxine (SYNTHROID) 75 MCG tablet Take 1 tablet (75 mcg total) by mouth daily. (Patient taking differently: Take 50 mcg by mouth daily. ) 90 tablet 3  . lisinopril (ZESTRIL) 10 MG tablet Take 1 tablet (10 mg total) by mouth daily. 30 tablet 3  . naproxen sodium (ALEVE) 220 MG tablet Take 440 mg by mouth in the morning and at bedtime.     Marland Kitchen  sitaGLIPtin-metformin (JANUMET) 50-500 MG tablet Take 1 tablet by mouth 2 (two) times daily with a meal. 180 tablet 1  . triamcinolone (KENALOG) 0.025 % ointment Apply 1 application topically at bedtime as needed. 15 g 1   No current facility-administered medications for this visit.    Allergies as of 12/03/2019 - Review Complete 12/03/2019  Allergen Reaction Noted  . Prednisone Other (See Comments) 05/06/2015    Family History  Problem Relation Age of Onset  . Cancer Mother        multiple myeloma  . Diabetes Mother   . Heart failure Father   . Diabetes Maternal Grandmother   . Colon cancer Neg Hx     Social History   Socioeconomic History  . Marital status: Single    Spouse name: Not on  file  . Number of children: Not on file  . Years of education: Not on file  . Highest education level: Not on file  Occupational History  . Not on file  Tobacco Use  . Smoking status: Former Smoker    Packs/day: 0.25    Years: 31.00    Pack years: 7.75    Types: Cigarettes    Quit date: 06/21/2018    Years since quitting: 1.4  . Smokeless tobacco: Never Used  Vaping Use  . Vaping Use: Never used  Substance and Sexual Activity  . Alcohol use: No  . Drug use: Yes    Types: Marijuana    Comment: once a day; history of methamphetamine and cocaine use with last use about 4 years ago-documented 12/03/2019  . Sexual activity: Not on file  Other Topics Concern  . Not on file  Social History Narrative  . Not on file   Social Determinants of Health   Financial Resource Strain:   . Difficulty of Paying Living Expenses: Not on file  Food Insecurity:   . Worried About Charity fundraiser in the Last Year: Not on file  . Ran Out of Food in the Last Year: Not on file  Transportation Needs:   . Lack of Transportation (Medical): Not on file  . Lack of Transportation (Non-Medical): Not on file  Physical Activity:   . Days of Exercise per Week: Not on file  . Minutes of Exercise per Session: Not on file  Stress:   . Feeling of Stress : Not on file  Social Connections:   . Frequency of Communication with Friends and Family: Not on file  . Frequency of Social Gatherings with Friends and Family: Not on file  . Attends Religious Services: Not on file  . Active Member of Clubs or Organizations: Not on file  . Attends Archivist Meetings: Not on file  . Marital Status: Not on file  Intimate Partner Violence:   . Fear of Current or Ex-Partner: Not on file  . Emotionally Abused: Not on file  . Physically Abused: Not on file  . Sexually Abused: Not on file    Review of Systems: Gen: Denies any fever, chills, cold or flulike symptoms, lightheadedness, dizziness, presyncope,  syncope. CV: Denies chest pain or heart crepitations. Resp: Denies shortness of breath or cough. GI: See HPI GU : Denies urinary burning, urinary frequency, urinary hesitancy MS: See HPI Derm: Denies rash Psych: Denies depression or anxiety. Heme: See HPI  Physical Exam: BP (!) 179/82   Pulse 72   Temp (!) 97 F (36.1 C) (Oral)   Ht '5\' 7"'  (1.702 m)   Wt 189 lb (  85.7 kg)   LMP 04/26/2016   BMI 29.60 kg/m  General:   Alert and oriented. Pleasant and cooperative. Well-nourished and well-developed.  Head:  Normocephalic and atraumatic. Eyes:  Without icterus, sclera clear and conjunctiva pink.  Ears:  Normal auditory acuity. Lungs:  Clear to auscultation bilaterally. No wheezes, rales, or rhonchi. No distress.  Heart:  S1, S2 present without murmurs appreciated.  Abdomen:  +BS, soft, non-tender and non-distended. No HSM noted. No guarding or rebound. No masses appreciated.  Rectal:  Deferred  Msk:  Symmetrical without gross deformities. Normal posture. Extremities:  Without edema. Neurologic:  Alert and  oriented x4;  grossly normal neurologically. Skin:  Intact without significant lesions or rashes. Psych:  Normal mood and affect.

## 2019-12-03 ENCOUNTER — Encounter: Payer: Self-pay | Admitting: Gastroenterology

## 2019-12-03 ENCOUNTER — Other Ambulatory Visit: Payer: Self-pay

## 2019-12-03 ENCOUNTER — Other Ambulatory Visit (HOSPITAL_COMMUNITY)
Admission: RE | Admit: 2019-12-03 | Discharge: 2019-12-03 | Disposition: A | Discharge status: Home / Self Care | Payer: Self-pay | Source: Ambulatory Visit | Attending: Gastroenterology | Admitting: Gastroenterology

## 2019-12-03 ENCOUNTER — Ambulatory Visit (INDEPENDENT_AMBULATORY_CARE_PROVIDER_SITE_OTHER): Payer: Self-pay | Admitting: Gastroenterology

## 2019-12-03 VITALS — BP 179/82 | HR 72 | Temp 97.0°F | Ht 67.0 in | Wt 189.0 lb

## 2019-12-03 DIAGNOSIS — R112 Nausea with vomiting, unspecified: Secondary | ICD-10-CM

## 2019-12-03 DIAGNOSIS — R1110 Vomiting, unspecified: Secondary | ICD-10-CM | POA: Insufficient documentation

## 2019-12-03 DIAGNOSIS — R768 Other specified abnormal immunological findings in serum: Secondary | ICD-10-CM | POA: Insufficient documentation

## 2019-12-03 NOTE — Patient Instructions (Addendum)
Please have labs completed at [facility] We will call you with results and further recommendations.  For nausea and vomiting: Avoid fried, fatty, greasy foods. Avoid carbonated beverages. Be sure not to eat within 3 hours of lying down. Try to ensure that your largest meal is not the last meal of the day. You may try to eat a couple of smaller meals in the latter part of the day rather than one larger meal after work. Please let me know if your morning nausea and vomiting do not resolve. This will be very important when it comes to Hep C treatment.   It is also very important that you work on decreasing use of Aleve. Try to avoid all NSAIDs including ibuprofen, Aleve, Advil, Goody powders, naproxen, and anything that says "NSAID" on the package of possible.  You may try talking with your primary care provider about other options to manage your arthritis/nerve pain.  It is okay to continue using menthol patches and icy hot.  Ermalinda Memos, PA-C [facility]

## 2019-12-03 NOTE — Assessment & Plan Note (Addendum)
Patient reports intermittent morning nausea and vomiting without hematemesis that has been present for years.  No lingering daytime symptoms.  Symptoms seem to be associated with eating late in the evenings as well as eating fatty meals in the evenings.  Denies GERD symptoms, any regular abdominal pain, dysphagia, or any other significant upper GI symptoms.  She has been losing weight, but this is intentional. She does have history of diabetes with improving A1c, most recent A1c in July 2021 was 7.1.  Symptoms may be secondary to gastroparesis in the setting of diabetes, possible atypical GERD.  Notably, she does report taking Aleve daily for arthritis and nerve pain.  It is possible she has developed gastritis secondary to this.  Consider trial of PPI; ever, we are considering hep C treatment which would complicate this.    We discussed dietary/lifestyle adjustments including avoiding fried, fatty, greasy foods, avoiding carbonated beverages, eating a couple of smaller meals rather than one large evening meal, and not eating within 3 hours of lying down.  Also advised that she limit NSAIDs as much as possible and discuss other options with PCP.  I requested she let us know if nausea/vomiting does not improve/resolve as we need to get this under control prior to hep C treatment.

## 2019-12-03 NOTE — Assessment & Plan Note (Addendum)
Positive hep C antibody 06/18/2019.  Patient reports previously testing positive for hep C about 5-6 years ago.  Also has history of hepatitis B at age 49.  Hepatitis B surface antigen was nonreactive in March 2021.  LFTs have been within normal limits.  History of drug use including methamphetamine and cocaine with last use about 4 years ago.  She does continue to smoke marijuana daily. No significant history of alcohol use.  She does have tattoos which were received at a tattoo parlor.  She has been in prison.  No signs or symptoms of decompensated liver disease at that time.  Last abdominal imaging on file with abdominal ultrasound in February 2018 revealing fatty liver.  We will start with confirming chronic hep C infection with HCVRNA.  If this is positive, she will need additional laboratory evaluation and ultrasound with elastography.  Notably, with report of history of hepatitis B, we will have to monitor her closely.  She also has long history of intermittent morning nausea with vomiting without hematemesis which may be secondary to underlying gastroparesis or possible atypical GERD as described above.  We discussed lifestyle and dietary modifications for management of symptoms as they seem to be closely associated with eating fattier meals in the evenings or eating late in the evening.  I have advised that she let me know if nausea/vomiting symptoms do not improve/resolve as we need to get this under better control prior to considering hep C treatment.  Of note, patient is also on Lipitor which would need to be held during hep C treatment if this is pursued.  Further recommendations to follow HCVRNA result.

## 2019-12-03 NOTE — Progress Notes (Signed)
Cc'ed to pcp °

## 2019-12-04 LAB — HCV RNA QUANT: HCV Quantitative: NOT DETECTED IU/mL (ref >50)

## 2019-12-04 NOTE — Progress Notes (Signed)
HCVRNA was not detected.  With prior hepatitis C antibody detected and negative HCVRNA, this means that she was previously infected with hepatitis C, but she does not currently have an infection; her body clear the infection itself.  She does not need treatment for hepatitis C.I recommend we go ahead and obtain a RUQ ultrasound of her liver.  Otherwise, no additional work-up is needed for hepatitis C.  [facility] Clinical Pool: Please arrange RUQ [place]. Dx: History of hep C.  I also recommend we arrange a follow-up visit in 3 months for intermittent nausea/vomiting.  She can call in the meantime if her symptoms do not improve with the dietary/lifestyle recommendations made at her office visit.

## 2019-12-05 ENCOUNTER — Encounter: Payer: Self-pay | Admitting: Gastroenterology

## 2019-12-06 ENCOUNTER — Other Ambulatory Visit: Payer: Self-pay | Admitting: *Deleted

## 2019-12-06 DIAGNOSIS — R768 Other specified abnormal immunological findings in serum: Secondary | ICD-10-CM

## 2019-12-12 ENCOUNTER — Other Ambulatory Visit: Payer: Self-pay

## 2019-12-12 ENCOUNTER — Ambulatory Visit (HOSPITAL_COMMUNITY)
Admission: RE | Admit: 2019-12-12 | Discharge: 2019-12-12 | Disposition: A | Discharge status: Home / Self Care | Payer: Self-pay | Source: Ambulatory Visit | Attending: Gastroenterology | Admitting: Gastroenterology

## 2019-12-12 DIAGNOSIS — R768 Other specified abnormal immunological findings in serum: Secondary | ICD-10-CM | POA: Insufficient documentation

## 2020-01-01 ENCOUNTER — Encounter: Payer: Self-pay | Admitting: Physician Assistant

## 2020-01-01 ENCOUNTER — Other Ambulatory Visit: Payer: Self-pay

## 2020-01-01 ENCOUNTER — Other Ambulatory Visit (HOSPITAL_COMMUNITY)
Admission: RE | Admit: 2020-01-01 | Discharge: 2020-01-01 | Disposition: A | Discharge status: Home / Self Care | Payer: Self-pay | Source: Ambulatory Visit | Attending: Physician Assistant | Admitting: Physician Assistant

## 2020-01-01 ENCOUNTER — Ambulatory Visit: Payer: Self-pay | Admitting: Physician Assistant

## 2020-01-01 VITALS — BP 138/84 | HR 71 | Temp 97.3°F | Ht 67.0 in | Wt 182.2 lb

## 2020-01-01 DIAGNOSIS — I10 Essential (primary) hypertension: Secondary | ICD-10-CM | POA: Insufficient documentation

## 2020-01-01 DIAGNOSIS — B1920 Unspecified viral hepatitis C without hepatic coma: Secondary | ICD-10-CM | POA: Insufficient documentation

## 2020-01-01 DIAGNOSIS — E785 Hyperlipidemia, unspecified: Secondary | ICD-10-CM | POA: Insufficient documentation

## 2020-01-01 DIAGNOSIS — N189 Chronic kidney disease, unspecified: Secondary | ICD-10-CM

## 2020-01-01 DIAGNOSIS — E118 Type 2 diabetes mellitus with unspecified complications: Secondary | ICD-10-CM | POA: Insufficient documentation

## 2020-01-01 DIAGNOSIS — E039 Hypothyroidism, unspecified: Secondary | ICD-10-CM

## 2020-01-01 LAB — COMPREHENSIVE METABOLIC PANEL
ALT: 36 U/L (ref 0–44)
AST: 30 U/L (ref 15–41)
Albumin: 3.9 g/dL (ref 3.5–5.0)
Alkaline Phosphatase: 47 U/L (ref 38–126)
Anion gap: 9 (ref 5–15)
BUN: 28 mg/dL — ABNORMAL HIGH (ref 6–20)
CO2: 22 mmol/L (ref 22–32)
Calcium: 9.4 mg/dL (ref 8.9–10.3)
Chloride: 110 mmol/L (ref 98–111)
Creatinine, Ser: 1.06 mg/dL — ABNORMAL HIGH (ref 0.44–1.00)
GFR, Estimated: >60 mL/min (ref >60)
Glucose, Bld: 113 mg/dL — ABNORMAL HIGH (ref 70–99)
Potassium: 4.3 mmol/L (ref 3.5–5.1)
Sodium: 141 mmol/L (ref 135–145)
Total Bilirubin: 0.8 mg/dL (ref 0.3–1.2)
Total Protein: 7.5 g/dL (ref 6.5–8.1)

## 2020-01-01 LAB — HEMOGLOBIN A1C
Hgb A1c MFr Bld: 5.9 % — ABNORMAL HIGH (ref 4.8–5.6)
Mean Plasma Glucose: 122.63 mg/dL

## 2020-01-01 LAB — TSH: TSH: 6.607 u[IU]/mL — ABNORMAL HIGH (ref 0.350–4.500)

## 2020-01-01 MED ORDER — GABAPENTIN 100 MG PO CAPS: 100.0000 mg | ORAL_CAPSULE | Freq: Two times a day (BID) | ORAL | 0 refills | Status: DC | PRN

## 2020-01-01 NOTE — Progress Notes (Signed)
BP 138/84   Pulse 71   Temp (!) 97.3 F (36.3 C)   Ht 5\' 7"  (1.702 m)   Wt 182 lb 3.2 oz (82.6 kg)   LMP 04/26/2016   SpO2 98%   BMI 28.54 kg/m    Subjective:    Patient ID: 06/24/2016, female    DOB: 1970-12-08, 49 y.o.   MRN: [id number]  HPI: Sandra Parsons is a 49 y.o. female presenting on 01/01/2020 for Diabetes, Hypothyroidism, and Gynecologic Exam   HPI    Pt had a negative covid 19 screening questionnaire.     Pt is 48yoF who presents for routine follow up DM, hypothyroidism and to update PAP.  Pt reports that she is achey today so wants to not do PAP today.  Pt is Being treated by GI with plans for hepatitis treatment.  They says she Needs to get N/V controlled first.  Pt says she is no longer havng the N/V.    She is using salonpas and lidocane patches for her pain.  She got covid vaccination but doesn't have her card with her.    She didn't get her labs done.    Her thyroid med was changed to 63 but she says she never got it so she's taking her old 61.  She has been watching what she eats and has lost a few pounds.         Relevant past medical, surgical, family and social history reviewed and updated as indicated. Interim medical history since our last visit reviewed. Allergies and medications reviewed and updated.   Current Outpatient Medications:  .  atorvastatin (LIPITOR) 20 MG tablet, Take 1 tablet (20 mg total) by mouth daily., Disp: 90 tablet, Rfl: 1 .  insulin glargine (LANTUS SOLOSTAR) 100 UNIT/ML Solostar Pen, Inject 30 Units into the skin daily., Disp: 1 pen, Rfl: PRN .  levothyroxine (SYNTHROID) 50 MCG tablet, Take 50 mcg by mouth daily before breakfast., Disp: , Rfl:  .  lisinopril (ZESTRIL) 10 MG tablet, Take 1 tablet (10 mg total) by mouth daily., Disp: 30 tablet, Rfl: 3 .  sitaGLIPtin-metformin (JANUMET) 50-500 MG tablet, Take 1 tablet by mouth 2 (two) times daily with a meal., Disp: 180 tablet, Rfl: 1 .  triamcinolone (KENALOG)  0.025 % ointment, Apply 1 application topically at bedtime as needed., Disp: 15 g, Rfl: 1 .  levothyroxine (SYNTHROID) 75 MCG tablet, Take 1 tablet (75 mcg total) by mouth daily. (Patient not taking: Reported on 01/01/2020), Disp: 90 tablet, Rfl: 3      Review of Systems  Per HPI unless specifically indicated above     Objective:    BP 138/84   Pulse 71   Temp (!) 97.3 F (36.3 C)   Ht 5\' 7"  (1.702 m)   Wt 182 lb 3.2 oz (82.6 kg)   LMP 04/26/2016   SpO2 98%   BMI 28.54 kg/m   Wt Readings from Last 3 Encounters:  01/01/20 182 lb 3.2 oz (82.6 kg)  12/03/19 189 lb (85.7 kg)  09/25/19 191 lb 8 oz (86.9 kg)    Physical Exam Vitals reviewed.  Constitutional:      General: She is not in acute distress.    Appearance: She is well-developed. She is not ill-appearing.  HENT:     Head: Normocephalic and atraumatic.  Cardiovascular:     Rate and Rhythm: Normal rate and regular rhythm.  Pulmonary:     Effort: Pulmonary effort is normal.     Breath  sounds: Normal breath sounds.  Abdominal:     General: Bowel sounds are normal.     Palpations: Abdomen is soft. There is no mass.     Tenderness: There is no abdominal tenderness.  Musculoskeletal:     Cervical back: Neck supple.     Right lower leg: No edema.     Left lower leg: No edema.  Lymphadenopathy:     Cervical: No cervical adenopathy.  Skin:    General: Skin is warm and dry.  Neurological:     Mental Status: She is alert and oriented to person, place, and time.  Psychiatric:        Behavior: Behavior normal.            Assessment & Plan:   Encounter Diagnoses  Name Primary?  . Controlled diabetes mellitus type 2 with complications, unspecified whether long term insulin use (HCC) Yes  . Essential hypertension   . Hyperlipidemia, unspecified hyperlipidemia type   . Hypothyroidism, unspecified type   . Chronic kidney disease, unspecified CKD stage   . Hepatitis C virus infection without hepatic coma,  unspecified chronicity      -pt will gt labs drawn.  She will be called with results -discussed with pt- May refer to nephrology if renal function not improved -pt to Continue with GI as scheduled.  -pt counseled to Avoid nsaids due to Cr -Trial of gabapentin .  She is told that she Can use APAP also if she wants but not to excess  -pt to follow up in 3 months.  Will plan to update PAP at that time.  Pt to contact office sooner prn

## 2020-01-04 ENCOUNTER — Other Ambulatory Visit: Payer: Self-pay | Admitting: Physician Assistant

## 2020-01-04 MED ORDER — JANUMET 50-500 MG PO TABS: ORAL_TABLET | ORAL | 1 refills | Status: DC

## 2020-01-04 MED ORDER — ATORVASTATIN CALCIUM 20 MG PO TABS
ORAL_TABLET | ORAL | 1 refills | Status: DC
Start: 2020-01-04 — End: 2020-07-03

## 2020-01-04 MED ORDER — LEVOTHYROXINE SODIUM 50 MCG PO TABS: ORAL_TABLET | ORAL | 1 refills | Status: DC

## 2020-01-22 ENCOUNTER — Ambulatory Visit: Payer: Self-pay | Admitting: Physician Assistant

## 2020-01-22 ENCOUNTER — Encounter: Payer: Self-pay | Admitting: Physician Assistant

## 2020-01-22 VITALS — BP 132/76 | HR 69 | Temp 96.3°F | Ht 67.0 in | Wt 186.4 lb

## 2020-01-22 DIAGNOSIS — M65331 Trigger finger, right middle finger: Secondary | ICD-10-CM

## 2020-01-22 NOTE — Progress Notes (Signed)
BP 132/76   Pulse 69   Temp (!) 96.3 F (35.7 C)   Ht 5\' 7"  (1.702 m)   Wt 186 lb 6.4 oz (84.6 kg)   LMP 04/26/2016   SpO2 99%   BMI 29.19 kg/m    Subjective:    Patient ID: [id number], female    DOB: Apr 02, 1970, 49 y.o.   MRN: [id number]  HPI: Sandra Parsons is a 49 y.o. female presenting on 01/22/2020 for Hand Problem   HPI   Pt had a negative covid 19 screening questionnaire.    Her Hand started bothering her Friday, "getting tight".  It has gotten stiff and she can't bend it -  Left middle finger.  Pt is right hand dominant.    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  She is not the biscuit maker.      Relevant past medical, surgical, family and social history reviewed and updated as indicated. Interim medical history since our last visit reviewed. Allergies and medications reviewed and updated.   Current Outpatient Medications:  .  atorvastatin (LIPITOR) 20 MG tablet, TAKE 1 Tablet BY MOUTH ONCE EVERY DAY, Disp: 90 tablet, Rfl: 1 .  gabapentin (NEURONTIN) 100 MG capsule, Take 1 capsule (100 mg total) by mouth 2 (two) times daily as needed., Disp: 60 capsule, Rfl: 0 .  insulin glargine (LANTUS SOLOSTAR) 100 UNIT/ML Solostar Pen, Inject 30 Units into the skin daily. (Patient taking differently: Inject 25 Units into the skin daily. ), Disp: 1 pen, Rfl: PRN .  levothyroxine (SYNTHROID) 50 MCG tablet, TAKE 1 Tablet BY MOUTH ONCE EVERY DAY, Disp: 90 tablet, Rfl: 1 .  levothyroxine (SYNTHROID) 50 MCG tablet, Take 50 mcg by mouth daily before breakfast., Disp: , Rfl:  .  lisinopril (ZESTRIL) 10 MG tablet, Take 1 tablet (10 mg total) by mouth daily., Disp: 30 tablet, Rfl: 3 .  sitaGLIPtin-metformin (JANUMET) 50-500 MG tablet, TAKE 1 Tablet  BY MOUTH TWICE DAILY WITH A MEAL, Disp: 180 tablet, Rfl: 1 .  triamcinolone (KENALOG) 0.025 % ointment, Apply 1 application topically at bedtime as needed., Disp: 15 g, Rfl: 1 .  levothyroxine  (SYNTHROID) 75 MCG tablet, Take 1 tablet (75 mcg total) by mouth daily. (Patient not taking: Reported on 01/01/2020), Disp: 90 tablet, Rfl: 3    Review of Systems  Per HPI unless specifically indicated above     Objective:    BP 132/76   Pulse 69   Temp (!) 96.3 F (35.7 C)   Ht 5\' 7"  (1.702 m)   Wt 186 lb 6.4 oz (84.6 kg)   LMP 04/26/2016   SpO2 99%   BMI 29.19 kg/m   Wt Readings from Last 3 Encounters:  01/22/20 186 lb 6.4 oz (84.6 kg)  01/01/20 182 lb 3.2 oz (82.6 kg)  12/03/19 189 lb (85.7 kg)    Physical Exam Constitutional:      General: She is not in acute distress.    Appearance: She is not ill-appearing.  HENT:     Head: Normocephalic and atraumatic.  Cardiovascular:     Pulses:          Radial pulses are 2+ on the right side and 2+ on the left side.  Pulmonary:     Effort: Pulmonary effort is normal. No respiratory distress.  Musculoskeletal:     Left hand: Decreased range of motion.     Comments: Left middle finger contracture/bent and unable to fully  extend.  No redness or swelling  Neurological:     Mental Status: She is alert and oriented to person, place, and time.  Psychiatric:        Attention and Perception: Attention normal.        Speech: Speech normal.        Behavior: Behavior is cooperative.            Assessment & Plan:    Encounter Diagnosis  Name Primary?  . Trigger middle finger of right hand Yes     -She has [employer] financial assistance -recommended trigger finger Splint.  Cautioned pt to avoid straightening the finger forcibly -encouraged gentle stretching/bending exercises -pt to use nsaid regularly to help with inflammation -Refer to orthopedist -pt to follow up as scheduled

## 2020-01-22 NOTE — Patient Instructions (Signed)
Trigger Finger  Trigger finger, also called stenosing tenosynovitis,  is a condition that causes a finger to get stuck in a bent position. Each finger has a tendon, which is a tough, cord-like tissue that connects muscle to bone, and each tendon passes through a tunnel of tissue called a tendon sheath. To move your finger, your tendon needs to glide freely through the sheath. Trigger finger happens when the tendon or the sheath thickens, making it difficult to move your finger. Trigger finger can affect any finger or a thumb. It may affect more than one finger. Mild cases may clear up with rest and medicine. Severe cases require more treatment. What are the causes? Trigger finger is caused by a thickened finger tendon or tendon sheath. The cause of this thickening is not known. What increases the risk? The following factors may make you more likely to develop this condition:  Doing activities that require a strong grip.  Having rheumatoid arthritis, gout, or diabetes.  Being 40-60 years old.  Being female. What are the signs or symptoms? Symptoms of this condition include:  Pain when bending or straightening your finger.  Tenderness or swelling where your finger attaches to the palm of your hand.  A lump in the palm of your hand or on the inside of your finger.  Hearing a noise like a pop or a snap when you try to straighten your finger.  Feeling a catching or locking sensation when you try to straighten your finger.  Being unable to straighten your finger. How is this diagnosed? This condition is diagnosed based on your symptoms and a physical exam. How is this treated? This condition may be treated by:  Resting your finger and avoiding activities that make symptoms worse.  Wearing a finger splint to keep your finger extended.  Taking NSAIDs, such as ibuprofen, to relieve pain and swelling.  Doing gentle exercises to stretch the finger as told by your health care provider.   Having medicine that reduces swelling and inflammation (steroids) injected into the tendon sheath. Injections may need to be repeated.  Having surgery to open the tendon sheath. This may be done if other treatments do not work and you cannot straighten your finger. You may need physical therapy after surgery. Follow these instructions at home: If you have a splint:  Wear the splint as told by your health care provider. Remove it only as told by your health care provider.  Loosen it if your fingers tingle, become numb, or turn cold and blue.  Keep it clean.  If the splint is not waterproof: ? Do not let it get wet. ? Cover it with a watertight covering when you take a bath or shower. Managing pain, stiffness, and swelling     If directed, apply heat to the affected area as often as told by your health care provider. Use the heat source that your health care provider recommends, such as a moist heat pack or a heating pad.  Place a towel between your skin and the heat source.  Leave the heat on for 20-30 minutes.  Remove the heat if your skin turns bright red. This is especially important if you are unable to feel pain, heat, or cold. You may have a greater risk of getting burned. If directed, put ice on the painful area. To do this:  If you have a removable splint, remove it as told by your health care provider.  Put ice in a plastic bag.  Place a   towel between your skin and the bag or between your splint and the bag.  Leave the ice on for 20 minutes, 2-3 times a day.  Activity  Rest your finger as told by your health care provider. Avoid activities that make the pain worse.  Return to your normal activities as told by your health care provider. Ask your health care provider what activities are safe for you.  Do exercises as told by your health care provider.  Ask your health care provider when it is safe to drive if you have a splint on your hand. General instructions   Take over-the-counter and prescription medicines only as told by your health care provider.  Keep all follow-up visits as told by your health care provider. This is important. Contact a health care provider if:  Your symptoms are not improving with home care. Summary  Trigger finger, also called stenosing tenosynovitis, causes your finger to get stuck in a bent position. This can make it difficult and painful to straighten your finger.  This condition develops when a finger tendon or tendon sheath thickens.  Treatment may include resting your finger, wearing a splint, and taking medicines.  In severe cases, surgery to open the tendon sheath may be needed. This information is not intended to replace advice given to you by your health care provider. Make sure you discuss any questions you have with your health care provider. Document Revised: 07/24/2018 Document Reviewed: 07/24/2018 Elsevier Patient Education  2020 [employer]  

## 2020-01-25 ENCOUNTER — Encounter: Payer: Self-pay | Admitting: Orthopedic Surgery

## 2020-02-06 ENCOUNTER — Other Ambulatory Visit: Payer: Self-pay

## 2020-02-06 ENCOUNTER — Encounter: Payer: Self-pay | Admitting: Orthopedic Surgery

## 2020-02-06 ENCOUNTER — Ambulatory Visit (INDEPENDENT_AMBULATORY_CARE_PROVIDER_SITE_OTHER): Payer: Self-pay | Admitting: Orthopedic Surgery

## 2020-02-06 VITALS — BP 134/77 | HR 81 | Ht 67.0 in | Wt 176.0 lb

## 2020-02-06 DIAGNOSIS — M65321 Trigger finger, right index finger: Secondary | ICD-10-CM

## 2020-02-06 DIAGNOSIS — G5601 Carpal tunnel syndrome, right upper limb: Secondary | ICD-10-CM

## 2020-02-06 DIAGNOSIS — M65332 Trigger finger, left middle finger: Secondary | ICD-10-CM

## 2020-02-06 NOTE — Progress Notes (Signed)
New Patient Visit  Assessment: Sandra Parsons is a 49 y.o. RHD female with the following: 1.  Trigger finger of left long finger 2.  Trigger finger right index finger 3.  Carpal tunnel syndrome right wrist    Plan: Sandra Parsons has trigger fingers to the left long finger, as well as the right index finger.  On physical exam, I was unable to elicit the triggering, but she describes it appropriately.  In addition, she has the associated discomfort overlying the A1 pulleys.  I offered her an injection in clinic today, and she elected to proceed with bilateral trigger finger injections.  Regarding the numbness that she has in her right hand, her symptoms are consistent with carpal tunnel.  I have advised her to use the wrist splint while sleeping at night, as this can help with her symptoms.  If she continues to have difficulties with numbness and tingling in the right hand, we would likely obtain an EMG to further evaluate the severity of the carpal tunnel syndrome.   Procedure note injection Left long finger trigger - A1 pulley   Verbal consent was obtained to inject the left long finger A1 pulley Timeout was completed to confirm the site of injection.  The skin was prepped with alcohol and ethyl chloride was sprayed at the injection site.  A 21-gauge needle was used to inject 20 mg of Depo-Medrol and 1% lidocaine (0.5 cc) into the A1 pulley  of the left long finger A1 pulley.  There were no complications. A sterile bandage was applied.  Procedure note injection Right index finger trigger - A1 pulley   Verbal consent was obtained to inject the right index finger A1 pulley Timeout was completed to confirm the site of injection.  The skin was prepped with alcohol and ethyl chloride was sprayed at the injection site.  A 21-gauge needle was used to inject 20 mg of Depo-Medrol and 1% lidocaine (0.5 cc) into the A1 pulley  of the right finger A1 pulley.  There were no complications. A sterile bandage  was applied.  Follow-up: Return if symptoms worsen or fail to improve.  Subjective:  Chief Complaint  Patient presents with  . Hand Pain    Lt hand 3rd finger for 3-4 weeks, Rt hand 2nd finger for 3-4 days. Both fingers have started with numbness, pain  and stiffness    History of Present Illness: Sandra Parsons is a 49 y.o. female who has been referred to clinic today by Soyla Dryer, PA-C for evaluation of left long finger pain.  She has been having left long finger pain for a few months.  More recently, she is developing pain in the palmar aspect of the right index finger.  She also describes a catching sensation and notes that she has had the left long finger essentially locked, requiring her right hand to fully straighten the long finger.  She has been wearing a splint on the long finger, which has helped with some of her symptoms.  She reports associated pain with a locking sensations.  She is never had injections for this issue in the past.  She also reports numbness and tingling in the right hand, primarily to the index and long fingers.  This happens almost every night.  She has been wearing a wrist brace, all the time, with limited improvement in her symptoms.   Review of Systems: No fevers or chills No numbness or tingling No chest pain No shortness of breath No bowel or bladder  dysfunction No GI distress No headaches   Medical History:  Past Medical History:  Diagnosis Date  . Depression   . Diabetes mellitus without complication ([place])   . Hepatitis B    per patient- diagnosed at age 35  . Hepatitis C   . Hyperlipidemia   . Hypertension    Not currently on BP meds  . Pinched nerve   . Thyroid disease     Past Surgical History:  Procedure Laterality Date  . BACK SURGERY     x2  . CYSTECTOMY     L forearm    Family History  Problem Relation Age of Onset  . Cancer Mother        multiple myeloma  . Diabetes Mother   . Heart failure Father   .  Diabetes Maternal Grandmother   . Colon cancer Neg Hx    Social History   Tobacco Use  . Smoking status: Former Smoker    Packs/day: 0.25    Years: 31.00    Pack years: 7.75    Types: Cigarettes    Quit date: 06/21/2018    Years since quitting: 1.6  . Smokeless tobacco: Never Used  Vaping Use  . Vaping Use: Never used  Substance Use Topics  . Alcohol use: No  . 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 Prednisone Other (See Comments)    Poor tolerance due to Diabetes "make my sugar go up"    Current Meds  Medication Sig  . atorvastatin (LIPITOR) 20 MG tablet TAKE 1 Tablet BY MOUTH ONCE EVERY DAY  . insulin glargine (LANTUS SOLOSTAR) 100 UNIT/ML Solostar Pen Inject 30 Units into the skin daily. (Patient taking differently: Inject 25 Units into the skin daily. )  . levothyroxine (SYNTHROID) 50 MCG tablet TAKE 1 Tablet BY MOUTH ONCE EVERY DAY  . levothyroxine (SYNTHROID) 50 MCG tablet Take 50 mcg by mouth daily before breakfast.  . lisinopril (ZESTRIL) 10 MG tablet Take 1 tablet (10 mg total) by mouth daily.  . sitaGLIPtin-metformin (JANUMET) 50-500 MG tablet TAKE 1 Tablet  BY MOUTH TWICE DAILY WITH A MEAL  . triamcinolone (KENALOG) 0.025 % ointment Apply 1 application topically at bedtime as needed.    Objective: BP 134/77   Pulse 81   Ht '5\' 7"'  (8.563 m)   Wt 176 lb (79.8 kg)   LMP 04/26/2016   BMI 27.57 kg/m   Physical Exam:  General: Alert and oriented, no acute distress  Evaluation of the right hand demonstrates no obvious deformity.  There is no thenar muscle wasting.  Positive carpal tunnel compression test.  Positive Tinel's at the carpal tunnel.  Tenderness over the palmar aspect of the index MCP joint.  No triggering elicited in clinic today.  Sensation is intact throughout.  Fingers are warm and well  perfused.  Evaluation of the left hand demonstrates no obvious deformity.  She has significant tenderness palpation at the palmar aspect of the long finger MCP.  No triggering was elicited in clinic today.  Fingers are warm and well-perfused.  Is a negative Tinel's at the carpal tunnel.  Negative carpal tunnel compression test.   IMAGING: No new imaging obtained today   New Medications:  No orders of the defined types were placed in this encounter.     Mordecai Rasmussen, MD  02/06/2020 12:34 PM

## 2020-02-06 NOTE — Patient Instructions (Signed)
Instructions Following Joint Injections  In clinic today, you received an injection in one of your joints (sometimes more than one).  Occasionally, you can have some pain at the injection site, this is normal.  You can place ice at the injection site, or take over-the-counter medications such as Tylenol (acetaminophen) or Advil (ibuprofen).  Please follow all directions listed on the bottle.  If your joint (knee or shoulder or finger) becomes swollen, red or very painful, please contact the clinic for additional assistance.   Two medications were injected, including lidocaine and a steroid (often referred to as cortisone).  Lidocaine is effective almost immediately but wears off quickly.  However, the steroid can take a few days to improve your symptoms.  In some cases, it can make your pain worse for a couple of days.  Do not be concerned if this happens as it is common.  You can apply ice or take some over-the-counter medications as needed.

## 2020-03-05 ENCOUNTER — Ambulatory Visit: Payer: Self-pay | Admitting: Gastroenterology

## 2020-03-14 ENCOUNTER — Emergency Department (HOSPITAL_COMMUNITY)
Admission: EM | Admit: 2020-03-14 | Discharge: 2020-03-14 | Disposition: A | Discharge status: Home / Self Care | Payer: Self-pay | Attending: Emergency Medicine | Admitting: Emergency Medicine

## 2020-03-14 ENCOUNTER — Other Ambulatory Visit: Payer: Self-pay

## 2020-03-14 ENCOUNTER — Emergency Department (HOSPITAL_COMMUNITY): Payer: Self-pay

## 2020-03-14 ENCOUNTER — Encounter (HOSPITAL_COMMUNITY): Payer: Self-pay | Admitting: Emergency Medicine

## 2020-03-14 DIAGNOSIS — W19XXXA Unspecified fall, initial encounter: Secondary | ICD-10-CM

## 2020-03-14 DIAGNOSIS — Z794 Long term (current) use of insulin: Secondary | ICD-10-CM | POA: Insufficient documentation

## 2020-03-14 DIAGNOSIS — S300XXA Contusion of lower back and pelvis, initial encounter: Secondary | ICD-10-CM | POA: Insufficient documentation

## 2020-03-14 DIAGNOSIS — W01198A Fall on same level from slipping, tripping and stumbling with subsequent striking against other object, initial encounter: Secondary | ICD-10-CM | POA: Insufficient documentation

## 2020-03-14 DIAGNOSIS — Z87891 Personal history of nicotine dependence: Secondary | ICD-10-CM | POA: Insufficient documentation

## 2020-03-14 DIAGNOSIS — I10 Essential (primary) hypertension: Secondary | ICD-10-CM | POA: Insufficient documentation

## 2020-03-14 DIAGNOSIS — Y990 Civilian activity done for income or pay: Secondary | ICD-10-CM | POA: Insufficient documentation

## 2020-03-14 DIAGNOSIS — Z79899 Other long term (current) drug therapy: Secondary | ICD-10-CM | POA: Insufficient documentation

## 2020-03-14 DIAGNOSIS — E119 Type 2 diabetes mellitus without complications: Secondary | ICD-10-CM | POA: Insufficient documentation

## 2020-03-14 MED ORDER — HYDROCODONE-ACETAMINOPHEN 5-325 MG PO TABS
2.0000 | ORAL_TABLET | Freq: Once | ORAL | Status: AC
  Administered 2020-03-14: 2 via ORAL
  Filled 2020-03-14: qty 2

## 2020-03-14 NOTE — ED Triage Notes (Signed)
Pt reports was walking while at work and tripped over own feet. Pt reports landed on right hip on the edge of a crate. Pt able to bear weight but with increased pain. Pt reports history of back surgery. Denies being on any blood thinners. No obvious deformity noted.

## 2020-03-14 NOTE — Discharge Instructions (Signed)
You were seen in the emergency department for evaluation of injuries from a fall.  You had x-rays of your pelvis right hip and tailbone that did not show any obvious fracture or dislocation.  This area is likely bruised and should improve over a few days.  Use ice to the affected area.  Ibuprofen or other anti-inflammatories.  Follow-up with your doctor.  Return to the emergency department for any worsening or concerning symptoms.

## 2020-03-14 NOTE — ED Provider Notes (Signed)
[facility] EMERGENCY DEPARTMENT Provider Note   CSN: [id number] Arrival date & time: 03/14/20  0174     History Chief Complaint  Patient presents with  . Leg Pain    Sandra Parsons is a 49 y.o. female.  She is complaining of of tailbone pain after fall at work.  She said she tripped over her feet and landed on her right hip and tailbone.  Occurred about an hour prior to arrival.  Not on blood thinners.  Denies any head or neck injury.  No numbness or weakness.  Pain is worse with ambulation.  Took some Aleve without any improvement.  The history is provided by the patient.  Leg Pain Location:  Buttock and hip Time since incident:  1 hour Injury: yes   Mechanism of injury: fall   Fall:    Fall occurred:  Walking   Point of impact:  Buttocks Hip location:  R hip Buttock location:  R buttock Pain details:    Quality:  Shooting   Radiates to:  R leg   Severity:  Severe   Onset quality:  Sudden   Timing:  Constant   Progression:  Unchanged Chronicity:  New Relieved by:  Nothing Worsened by:  Bearing weight Ineffective treatments:  NSAIDs Associated symptoms: no fever, no muscle weakness, no neck pain, no numbness and no tingling        Past Medical History:  Diagnosis Date  . Depression   . Diabetes mellitus without complication ([place])   . Hepatitis B    per patient- diagnosed at age 39  . Hepatitis C   . Hyperlipidemia   . Hypertension    Not currently on BP meds  . Pinched nerve   . Thyroid disease     Patient Active Problem List   Diagnosis Date Noted  . Positive hepatitis C antibody test 12/03/2019  . Non-intractable vomiting 12/03/2019    Past Surgical History:  Procedure Laterality Date  . BACK SURGERY     x2  . CYSTECTOMY     L forearm     OB History   No obstetric history on file.     Family History  Problem Relation Age of Onset  . Cancer Mother        multiple myeloma  . Diabetes Mother   . Heart failure Father   . Diabetes  Maternal Grandmother   . Colon cancer Neg Hx     Social History   Tobacco Use  . Smoking status: Former Smoker    Packs/day: 0.25    Years: 31.00    Pack years: 7.75    Types: Cigarettes    Quit date: 06/21/2018    Years since quitting: 1.7  . Smokeless tobacco: Never Used  Vaping Use  . Vaping Use: Never used  Substance Use Topics  . Alcohol use: No  . 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 Authorizing Provider  atorvastatin (LIPITOR) 20 MG tablet TAKE 1 Tablet BY MOUTH ONCE EVERY DAY 01/04/20   Soyla Dryer, PA-C  gabapentin (NEURONTIN) 100 MG capsule Take 1 capsule (100 mg total) by mouth 2 (two) times daily as needed. Patient not taking: Reported on 02/06/2020 01/01/20   Soyla Dryer, PA-C  insulin glargine (LANTUS SOLOSTAR) 100 UNIT/ML Solostar  Pen Inject 30 Units into the skin daily. Patient taking differently: Inject 25 Units into the skin daily.  08/21/19   Soyla Dryer, PA-C  levothyroxine (SYNTHROID) 50 MCG tablet TAKE 1 Tablet BY MOUTH ONCE EVERY DAY 01/04/20   Soyla Dryer, PA-C  levothyroxine (SYNTHROID) 50 MCG tablet Take 50 mcg by mouth daily before breakfast.    [provider]  levothyroxine (SYNTHROID) 75 MCG tablet Take 1 tablet (75 mcg total) by mouth daily. Patient not taking: Reported on 01/01/2020 09/25/19   Soyla Dryer, PA-C  lisinopril (ZESTRIL) 10 MG tablet Take 1 tablet (10 mg total) by mouth daily. 11/19/19   Soyla Dryer, PA-C  sitaGLIPtin-metformin (JANUMET) 50-500 MG tablet TAKE 1 Tablet  BY MOUTH TWICE DAILY WITH A MEAL 01/04/20   Soyla Dryer, PA-C  triamcinolone (KENALOG) 0.025 % ointment Apply 1 application topically at bedtime as needed. 06/14/19   Soyla Dryer, PA-C    Allergies     Prednisone  Review of Systems   Review of Systems  Constitutional: Negative for fever.  Musculoskeletal: Negative for neck pain.  Skin: Negative for wound.  Neurological: Negative for weakness and numbness.    Physical Exam Updated Vital Signs Ht '5\' 7"'  (1.702 m)   Wt 79.4 kg   LMP 04/26/2016   BMI 27.41 kg/m   Physical Exam Constitutional:      Appearance: Normal appearance. She is well-developed and well-nourished.  HENT:     Head: Normocephalic and atraumatic.  Eyes:     Conjunctiva/sclera: Conjunctivae normal.  Musculoskeletal:        General: Tenderness present.     Cervical back: Neck supple.     Comments: She has no midline cervical thoracic or lumbar tenderness.  She is tender through her sacrum and coccyx.  Pain with range of motion of her right hip.  Knees and ankles nontender.  Distal motor and sensation intact.  Skin:    General: Skin is warm and dry.  Neurological:     General: No focal deficit present.     Mental Status: She is alert.     GCS: GCS eye subscore is 4. GCS verbal subscore is 5. GCS motor subscore is 6.     Sensory: No sensory deficit.     Motor: No weakness.  Psychiatric:        Mood and Affect: Mood and affect normal.     ED Results / Procedures / Treatments   Labs (all labs ordered are listed, but only abnormal results are displayed) Labs Reviewed - No data to display  EKG None  Radiology DG Sacrum/Coccyx  Result Date: 03/14/2020 CLINICAL DATA:  Fall with right hip and tailbone pain EXAM: SACRUM AND COCCYX - 2+ VIEW COMPARISON:  Abdominal CT 07/10/2013 FINDINGS: No evidence of sacral fracture or sacroiliac diastasis. Sacral shape on the lateral view is stable from comparison CT. L4-5 and L5-S1 disc degeneration. IMPRESSION: No evidence of fracture. Electronically Signed   By: Monte Fantasia M.D.   On: 03/14/2020 08:31   DG Hip Unilat With Pelvis 2-3 Views Right  Result Date: 03/14/2020 CLINICAL DATA:  Recent trip and fall with  right-sided hip pain, initial encounter EXAM: DG HIP (WITH OR WITHOUT PELVIS) 3V RIGHT COMPARISON:  None. FINDINGS: Pelvic ring is intact. Mild degenerative changes of the hip joints are noted bilaterally. Skin folds are noted over the right proximal femur. No acute fracture or dislocation is noted. No soft tissue abnormality is seen. Degenerative changes of the lumbar spine are noted.  IMPRESSION: Mild degenerative change without acute abnormality. Electronically Signed   By: Inez Catalina M.D.   On: 03/14/2020 08:33    Procedures Procedures (including critical care time)  Medications Ordered in ED Medications  HYDROcodone-acetaminophen (NORCO/VICODIN) 5-325 MG per tablet 2 tablet (2 tablets Oral Given 03/14/20 0094)    ED Course  I have reviewed the triage vital signs and the nursing notes.  Pertinent labs & imaging results that were available during my care of the patient were reviewed by me and considered in my medical decision making (see chart for details).  Clinical Course as of 03/14/20 1732  Fri Mar 14, 2020  0806 X-rays of right hip pelvis and sacrum interpreted by me as no acute fractures.  Awaiting radiology reading. [MB]  [phone] Reviewed results with patient.  She is asking for something for pain here but does not want a prescription to go home with.  She has a ride coming. [MB]    Clinical Course User Index [MB] Hayden Rasmussen, MD   MDM Rules/Calculators/A&P                         Differential diagnosis includes fracture, contusion, dislocation  Final Clinical Impression(s) / ED Diagnoses Final diagnoses:  Contusion of coccyx, initial encounter  Fall, initial encounter    Rx / DC Orders ED Discharge Orders    None       Hayden Rasmussen, MD 03/14/20 1733

## 2020-03-17 ENCOUNTER — Other Ambulatory Visit: Payer: Self-pay | Admitting: Physician Assistant

## 2020-03-17 DIAGNOSIS — E118 Type 2 diabetes mellitus with unspecified complications: Secondary | ICD-10-CM

## 2020-03-17 DIAGNOSIS — E785 Hyperlipidemia, unspecified: Secondary | ICD-10-CM

## 2020-03-17 DIAGNOSIS — E039 Hypothyroidism, unspecified: Secondary | ICD-10-CM

## 2020-03-17 DIAGNOSIS — I10 Essential (primary) hypertension: Secondary | ICD-10-CM

## 2020-03-17 DIAGNOSIS — N189 Chronic kidney disease, unspecified: Secondary | ICD-10-CM

## 2020-03-26 ENCOUNTER — Other Ambulatory Visit (HOSPITAL_COMMUNITY)
Admission: RE | Admit: 2020-03-26 | Discharge: 2020-03-26 | Disposition: A | Discharge status: Home / Self Care | Payer: Self-pay | Source: Ambulatory Visit | Attending: Physician Assistant | Admitting: Physician Assistant

## 2020-03-26 ENCOUNTER — Other Ambulatory Visit: Payer: Self-pay

## 2020-03-26 DIAGNOSIS — I10 Essential (primary) hypertension: Secondary | ICD-10-CM | POA: Insufficient documentation

## 2020-03-26 DIAGNOSIS — E039 Hypothyroidism, unspecified: Secondary | ICD-10-CM | POA: Insufficient documentation

## 2020-03-26 DIAGNOSIS — N189 Chronic kidney disease, unspecified: Secondary | ICD-10-CM | POA: Insufficient documentation

## 2020-03-26 DIAGNOSIS — E118 Type 2 diabetes mellitus with unspecified complications: Secondary | ICD-10-CM | POA: Insufficient documentation

## 2020-03-26 DIAGNOSIS — E785 Hyperlipidemia, unspecified: Secondary | ICD-10-CM | POA: Insufficient documentation

## 2020-03-26 LAB — COMPREHENSIVE METABOLIC PANEL
ALT: 31 U/L (ref 0–44)
AST: 37 U/L (ref 15–41)
Albumin: 4.2 g/dL (ref 3.5–5.0)
Alkaline Phosphatase: 57 U/L (ref 38–126)
Anion gap: 10 (ref 5–15)
BUN: 33 mg/dL — ABNORMAL HIGH (ref 6–20)
CO2: 22 mmol/L (ref 22–32)
Calcium: 9.3 mg/dL (ref 8.9–10.3)
Chloride: 108 mmol/L (ref 98–111)
Creatinine, Ser: 1.19 mg/dL — ABNORMAL HIGH (ref 0.44–1.00)
GFR, Estimated: 56 mL/min — ABNORMAL LOW (ref >60)
Glucose, Bld: 91 mg/dL (ref 70–99)
Potassium: 4.6 mmol/L (ref 3.5–5.1)
Sodium: 140 mmol/L (ref 135–145)
Total Bilirubin: 1 mg/dL (ref 0.3–1.2)
Total Protein: 8 g/dL (ref 6.5–8.1)

## 2020-03-26 LAB — HEMOGLOBIN A1C
Hgb A1c MFr Bld: 6.2 % — ABNORMAL HIGH (ref 4.8–5.6)
Mean Plasma Glucose: 131.24 mg/dL

## 2020-03-26 LAB — LIPID PANEL
Cholesterol: 155 mg/dL (ref 0–200)
HDL: 43 mg/dL (ref >40)
LDL Cholesterol: 97 mg/dL (ref 0–99)
Total CHOL/HDL Ratio: 3.6 RATIO
Triglycerides: 75 mg/dL (ref <150)
VLDL: 15 mg/dL (ref 0–40)

## 2020-03-26 LAB — TSH: TSH: 5.869 u[IU]/mL — ABNORMAL HIGH (ref 0.350–4.500)

## 2020-04-02 ENCOUNTER — Encounter: Payer: Self-pay | Admitting: Physician Assistant

## 2020-04-02 ENCOUNTER — Ambulatory Visit: Payer: Self-pay | Admitting: Physician Assistant

## 2020-04-02 ENCOUNTER — Other Ambulatory Visit (HOSPITAL_COMMUNITY)
Admission: RE | Admit: 2020-04-02 | Discharge: 2020-04-02 | Disposition: A | Discharge status: Home / Self Care | Payer: Self-pay | Source: Ambulatory Visit | Attending: Physician Assistant | Admitting: Physician Assistant

## 2020-04-02 VITALS — BP 148/80 | HR 73 | Temp 95.8°F | Ht 67.0 in | Wt 175.0 lb

## 2020-04-02 DIAGNOSIS — I10 Essential (primary) hypertension: Secondary | ICD-10-CM

## 2020-04-02 DIAGNOSIS — Z124 Encounter for screening for malignant neoplasm of cervix: Secondary | ICD-10-CM

## 2020-04-02 DIAGNOSIS — E118 Type 2 diabetes mellitus with unspecified complications: Secondary | ICD-10-CM

## 2020-04-02 DIAGNOSIS — E785 Hyperlipidemia, unspecified: Secondary | ICD-10-CM

## 2020-04-02 DIAGNOSIS — N189 Chronic kidney disease, unspecified: Secondary | ICD-10-CM

## 2020-04-02 DIAGNOSIS — M25551 Pain in right hip: Secondary | ICD-10-CM

## 2020-04-02 DIAGNOSIS — E039 Hypothyroidism, unspecified: Secondary | ICD-10-CM

## 2020-04-02 MED ORDER — LISINOPRIL 20 MG PO TABS: 20.0000 mg | ORAL_TABLET | Freq: Every day | ORAL | 3 refills | Status: DC

## 2020-04-02 NOTE — Progress Notes (Signed)
BP (!) 148/80   Pulse 73   Temp (!) 95.8 F (35.4 C)   Ht 5\' 7"  (1.702 m)   Wt 175 lb (79.4 kg)   LMP 04/26/2016   SpO2 99%   BMI 27.41 kg/m    Subjective:    Patient ID: 06/24/2016, female    DOB: 09-10-1970, 50 y.o.   MRN: [id number]  HPI: Sandra Parsons is a 50 y.o. female presenting on 04/02/2020 for Diabetes, Thyroid Problem, Hyperlipidemia, and Gynecologic Exam   HPI  Pt had a negative covid 19 screening questionnaire.    Chief Complaint  Patient presents with  . Diabetes  . Thyroid Problem  . Hyperlipidemia  . Gynecologic Exam      Pt says her R Hip still hurting.  It was hurting even before her fall on 12/24.  Hip xray on 12/24- reported as degenerative changes LS spine with no report of hip abnormality.  She has been taking aleve for the pain.  She says her trigger fingers are better since orthopedist injected them.  She is still  Working at [employer].  She says she is doing pretty well except for the hip.     Relevant past medical, surgical, family and social history reviewed and updated as indicated. Interim medical history since our last visit reviewed. Allergies and medications reviewed and updated.    Current Outpatient Medications:  .  atorvastatin (LIPITOR) 20 MG tablet, TAKE 1 Tablet BY MOUTH ONCE EVERY DAY, Disp: 90 tablet, Rfl: 1 .  insulin glargine (LANTUS SOLOSTAR) 100 UNIT/ML Solostar Pen, Inject 30 Units into the skin daily. (Patient taking differently: Inject 25 Units into the skin daily.), Disp: 1 pen, Rfl: PRN .  levothyroxine (SYNTHROID) 50 MCG tablet, TAKE 1 Tablet BY MOUTH ONCE EVERY DAY, Disp: 90 tablet, Rfl: 1 .  lisinopril (ZESTRIL) 10 MG tablet, Take 1 tablet (10 mg total) by mouth daily., Disp: 30 tablet, Rfl: 3 .  sitaGLIPtin-metformin (JANUMET) 50-500 MG tablet, TAKE 1 Tablet  BY MOUTH TWICE DAILY WITH A MEAL, Disp: 180 tablet, Rfl: 1 .  triamcinolone (KENALOG) 0.025 % ointment, Apply 1 application topically at bedtime as  needed., Disp: 15 g, Rfl: 1 .  gabapentin (NEURONTIN) 100 MG capsule, Take 1 capsule (100 mg total) by mouth 2 (two) times daily as needed. (Patient not taking: No sig reported), Disp: 60 capsule, Rfl: 0    Review of Systems  Per HPI unless specifically indicated above     Objective:    BP (!) 148/80   Pulse 73   Temp (!) 95.8 F (35.4 C)   Ht 5\' 7"  (1.702 m)   Wt 175 lb (79.4 kg)   LMP 04/26/2016   SpO2 99%   BMI 27.41 kg/m   Wt Readings from Last 3 Encounters:  04/02/20 175 lb (79.4 kg)  03/14/20 175 lb (79.4 kg)  02/06/20 176 lb (79.8 kg)    Physical Exam Vitals reviewed.  Constitutional:      General: She is not in acute distress.    Appearance: She is well-developed and well-nourished. She is not ill-appearing.  HENT:     Head: Normocephalic and atraumatic.  Cardiovascular:     Rate and Rhythm: Normal rate and regular rhythm.  Pulmonary:     Effort: Pulmonary effort is normal.     Breath sounds: Normal breath sounds.  Abdominal:     General: Bowel sounds are normal.     Palpations: Abdomen is soft. There is no hepatosplenomegaly or mass.  Tenderness: There is no abdominal tenderness.  Musculoskeletal:        General: No edema.     Cervical back: Neck supple.     Right hip: No deformity. Normal range of motion.     Left hip: Normal range of motion.     Right lower leg: No edema.     Left lower leg: No edema.     Comments: Pain with ROM right hip  Lymphadenopathy:     Cervical: No cervical adenopathy.  Skin:    General: Skin is warm and dry.  Neurological:     Mental Status: She is alert and oriented to person, place, and time.  Psychiatric:        Mood and Affect: Mood and affect normal.        Behavior: Behavior normal.     Results for orders placed or performed during the hospital encounter of 03/26/20  TSH  Result Value Ref Range   TSH 5.869 (H) 0.350 - 4.500 uIU/mL  Hemoglobin A1c  Result Value Ref Range   Hgb A1c MFr Bld 6.2 (H) 4.8 -  5.6 %   Mean Plasma Glucose 131.24 mg/dL  Lipid panel  Result Value Ref Range   Cholesterol 155 0 - 200 mg/dL   Triglycerides 75 <060 mg/dL   HDL 43 >04 mg/dL   Total CHOL/HDL Ratio 3.6 RATIO   VLDL 15 0 - 40 mg/dL   LDL Cholesterol 97 0 - 99 mg/dL  Comprehensive metabolic panel  Result Value Ref Range   Sodium 140 135 - 145 mmol/L   Potassium 4.6 3.5 - 5.1 mmol/L   Chloride 108 98 - 111 mmol/L   CO2 22 22 - 32 mmol/L   Glucose, Bld 91 70 - 99 mg/dL   BUN 33 (H) 6 - 20 mg/dL   Creatinine, Ser 5.99 (H) 0.44 - 1.00 mg/dL   Calcium 9.3 8.9 - 77.4 mg/dL   Total Protein 8.0 6.5 - 8.1 g/dL   Albumin 4.2 3.5 - 5.0 g/dL   AST 37 15 - 41 U/L   ALT 31 0 - 44 U/L   Alkaline Phosphatase 57 38 - 126 U/L   Total Bilirubin 1.0 0.3 - 1.2 mg/dL   GFR, Estimated 56 (L) >60 mL/min   Anion gap 10 5 - 15      Assessment & Plan:    Encounter Diagnoses  Name Primary?  . Routine Papanicolaou smear Yes  . Controlled diabetes mellitus type 2 with complications, unspecified whether long term insulin use (HCC)   . Essential hypertension   . Hyperlipidemia, unspecified hyperlipidemia type   . Hypothyroidism, unspecified type   . Chronic kidney disease, unspecified CKD stage   . Right hip pain       -reviewed labs with pt  -pt counseled to Avoid / limit nsaids due to renal function.  Recommended APAP and/or ice/heat -Increase lisinopril to 20mg  -she is to continue other medications as prescribed -encouraged pt to get covid booster -continue other medications -Refer to orthopdics for the hip -she has appointment for diabetic eye exam today -pt to follow up 3 months.  She is to contact office sooner prn

## 2020-04-03 LAB — CYTOLOGY - PAP
Adequacy: ABSENT
Comment: NEGATIVE
Diagnosis: NEGATIVE
High risk HPV: NEGATIVE

## 2020-04-24 ENCOUNTER — Encounter: Payer: Self-pay | Admitting: Radiology

## 2020-04-28 ENCOUNTER — Encounter: Payer: Self-pay | Admitting: Physician Assistant

## 2020-04-28 DIAGNOSIS — E11319 Type 2 diabetes mellitus with unspecified diabetic retinopathy without macular edema: Secondary | ICD-10-CM | POA: Insufficient documentation

## 2020-05-13 ENCOUNTER — Ambulatory Visit: Payer: Self-pay | Admitting: Physician Assistant

## 2020-05-13 ENCOUNTER — Encounter: Payer: Self-pay | Admitting: Physician Assistant

## 2020-05-13 DIAGNOSIS — K0889 Other specified disorders of teeth and supporting structures: Secondary | ICD-10-CM

## 2020-05-13 MED ORDER — AMOXICILLIN 500 MG PO CAPS: 500.0000 mg | ORAL_CAPSULE | Freq: Three times a day (TID) | ORAL | 0 refills | Status: AC

## 2020-05-13 NOTE — Progress Notes (Signed)
LMP 04/26/2016    Subjective:    Patient ID: Sandra Parsons, female    DOB: 28-Dec-1970, 50 y.o.   MRN: [id number]  HPI: Sandra Parsons is a 50 y.o. female presenting on 05/13/2020 for No chief complaint on file.   HPI    This is a telemedicine appointment through [employer] due to coronavirus pandemic.  I connected with  Stevey Hibbard on 05/13/20 by a video enabled telemedicine application and verified that I am speaking with the correct person using two identifiers.   I discussed the limitations of evaluation and management by telemedicine. The patient expressed understanding and agreed to proceed.  Pt is at home.  Provider is working from home office.    Pt complains of pain Front upper R tooth.  She says it has been Bothering her for a while but got worse overnight.  She hasn't been able to eat for days because it's been bothering her.  Pt feels like it is swelled.  No reports of fever.     Relevant past medical, surgical, family and social history reviewed and updated as indicated. Interim medical history since our last visit reviewed. Allergies and medications reviewed and updated.   Current Outpatient Medications:  .  atorvastatin (LIPITOR) 20 MG tablet, TAKE 1 Tablet BY MOUTH ONCE EVERY DAY, Disp: 90 tablet, Rfl: 1 .  insulin glargine (LANTUS SOLOSTAR) 100 UNIT/ML Solostar Pen, Inject 30 Units into the skin daily. (Patient taking differently: Inject 25 Units into the skin daily.), Disp: 1 pen, Rfl: PRN .  levothyroxine (SYNTHROID) 50 MCG tablet, TAKE 1 Tablet BY MOUTH ONCE EVERY DAY, Disp: 90 tablet, Rfl: 1 .  lisinopril (ZESTRIL) 20 MG tablet, Take 1 tablet (20 mg total) by mouth daily., Disp: 30 tablet, Rfl: 3 .  naproxen sodium (ALEVE) 220 MG tablet, Take 220 mg by mouth., Disp: , Rfl:  .  sitaGLIPtin-metformin (JANUMET) 50-500 MG tablet, TAKE 1 Tablet  BY MOUTH TWICE DAILY WITH A MEAL, Disp: 180 tablet, Rfl: 1 .  triamcinolone (KENALOG) 0.025 % ointment, Apply 1  application topically at bedtime as needed., Disp: 15 g, Rfl: 1 .  gabapentin (NEURONTIN) 100 MG capsule, Take 1 capsule (100 mg total) by mouth 2 (two) times daily as needed. (Patient not taking: No sig reported), Disp: 60 capsule, Rfl: 0     Review of Systems  Per HPI unless specifically indicated above     Objective:    LMP 04/26/2016   Wt Readings from Last 3 Encounters:  04/02/20 175 lb (79.4 kg)  03/14/20 175 lb (79.4 kg)  02/06/20 176 lb (79.8 kg)    Physical Exam Constitutional:      General: She is not in acute distress.    Appearance: She is not toxic-appearing.  HENT:     Head: Normocephalic and atraumatic.     Mouth/Throat:     Dentition: Abnormal dentition. Dental caries present.     Comments: Pt is missing some teeth.  R upper front tooth is very loose.  No obvious facial swelling.  No drooling.  No trismus.  Speech normal.  Pulmonary:     Effort: No respiratory distress.  Neurological:     Mental Status: She is alert and oriented to person, place, and time.  Psychiatric:        Attention and Perception: Attention normal.        Speech: Speech normal.        Behavior: Behavior is cooperative.  Assessment & Plan:    Encounter Diagnosis  Name Primary?  Norva Riffle Yes     -rx Amoxil  -pt is put on Dental list -pt to follow up April as scheduled

## 2020-06-18 ENCOUNTER — Other Ambulatory Visit: Payer: Self-pay | Admitting: Physician Assistant

## 2020-06-18 DIAGNOSIS — N189 Chronic kidney disease, unspecified: Secondary | ICD-10-CM

## 2020-06-18 DIAGNOSIS — I10 Essential (primary) hypertension: Secondary | ICD-10-CM

## 2020-06-18 DIAGNOSIS — E039 Hypothyroidism, unspecified: Secondary | ICD-10-CM

## 2020-06-18 DIAGNOSIS — E785 Hyperlipidemia, unspecified: Secondary | ICD-10-CM

## 2020-06-18 DIAGNOSIS — E118 Type 2 diabetes mellitus with unspecified complications: Secondary | ICD-10-CM

## 2020-06-30 ENCOUNTER — Other Ambulatory Visit: Payer: Self-pay | Admitting: Physician Assistant

## 2020-06-30 MED ORDER — LISINOPRIL 20 MG PO TABS: 20.0000 mg | ORAL_TABLET | Freq: Every day | ORAL | 3 refills | Status: DC

## 2020-07-01 ENCOUNTER — Ambulatory Visit: Payer: Self-pay | Admitting: Physician Assistant

## 2020-07-02 ENCOUNTER — Other Ambulatory Visit: Payer: Self-pay | Admitting: Physician Assistant

## 2020-07-02 MED ORDER — LISINOPRIL 20 MG PO TABS: 20.0000 mg | ORAL_TABLET | Freq: Every day | ORAL | 3 refills | Status: DC

## 2020-07-03 ENCOUNTER — Other Ambulatory Visit: Payer: Self-pay | Admitting: Physician Assistant

## 2020-07-14 ENCOUNTER — Other Ambulatory Visit (HOSPITAL_COMMUNITY)
Admission: RE | Admit: 2020-07-14 | Discharge: 2020-07-14 | Disposition: A | Discharge status: Home / Self Care | Payer: Self-pay | Source: Ambulatory Visit | Attending: Physician Assistant | Admitting: Physician Assistant

## 2020-07-14 ENCOUNTER — Other Ambulatory Visit: Payer: Self-pay

## 2020-07-14 DIAGNOSIS — E118 Type 2 diabetes mellitus with unspecified complications: Secondary | ICD-10-CM | POA: Insufficient documentation

## 2020-07-14 DIAGNOSIS — N189 Chronic kidney disease, unspecified: Secondary | ICD-10-CM | POA: Insufficient documentation

## 2020-07-14 DIAGNOSIS — I10 Essential (primary) hypertension: Secondary | ICD-10-CM | POA: Insufficient documentation

## 2020-07-14 DIAGNOSIS — E785 Hyperlipidemia, unspecified: Secondary | ICD-10-CM | POA: Insufficient documentation

## 2020-07-14 DIAGNOSIS — E039 Hypothyroidism, unspecified: Secondary | ICD-10-CM | POA: Insufficient documentation

## 2020-07-14 LAB — COMPREHENSIVE METABOLIC PANEL
ALT: 25 U/L (ref 0–44)
AST: 28 U/L (ref 15–41)
Albumin: 3.7 g/dL (ref 3.5–5.0)
Alkaline Phosphatase: 42 U/L (ref 38–126)
Anion gap: 7 (ref 5–15)
BUN: 36 mg/dL — ABNORMAL HIGH (ref 6–20)
CO2: 22 mmol/L (ref 22–32)
Calcium: 9.4 mg/dL (ref 8.9–10.3)
Chloride: 112 mmol/L — ABNORMAL HIGH (ref 98–111)
Creatinine, Ser: 1.21 mg/dL — ABNORMAL HIGH (ref 0.44–1.00)
GFR, Estimated: 55 mL/min — ABNORMAL LOW (ref >60)
Glucose, Bld: 101 mg/dL — ABNORMAL HIGH (ref 70–99)
Potassium: 4.5 mmol/L (ref 3.5–5.1)
Sodium: 141 mmol/L (ref 135–145)
Total Bilirubin: 0.8 mg/dL (ref 0.3–1.2)
Total Protein: 6.9 g/dL (ref 6.5–8.1)

## 2020-07-14 LAB — LIPID PANEL
Cholesterol: 135 mg/dL (ref 0–200)
HDL: 42 mg/dL (ref >40)
LDL Cholesterol: 72 mg/dL (ref 0–99)
Total CHOL/HDL Ratio: 3.2 RATIO
Triglycerides: 107 mg/dL (ref <150)
VLDL: 21 mg/dL (ref 0–40)

## 2020-07-14 LAB — HEMOGLOBIN A1C
Hgb A1c MFr Bld: 6 % — ABNORMAL HIGH (ref 4.8–5.6)
Mean Plasma Glucose: 125.5 mg/dL

## 2020-07-14 LAB — TSH: TSH: 6.173 u[IU]/mL — ABNORMAL HIGH (ref 0.350–4.500)

## 2020-07-15 LAB — MICROALBUMIN, URINE: Microalb, Ur: 129.6 ug/mL — ABNORMAL HIGH

## 2020-07-23 ENCOUNTER — Encounter: Payer: Self-pay | Admitting: Physician Assistant

## 2020-07-23 ENCOUNTER — Ambulatory Visit: Payer: Self-pay | Admitting: Physician Assistant

## 2020-07-23 VITALS — BP 127/79 | HR 76 | Temp 96.6°F

## 2020-07-23 DIAGNOSIS — E118 Type 2 diabetes mellitus with unspecified complications: Secondary | ICD-10-CM

## 2020-07-23 DIAGNOSIS — K0889 Other specified disorders of teeth and supporting structures: Secondary | ICD-10-CM

## 2020-07-23 DIAGNOSIS — E039 Hypothyroidism, unspecified: Secondary | ICD-10-CM

## 2020-07-23 DIAGNOSIS — N189 Chronic kidney disease, unspecified: Secondary | ICD-10-CM

## 2020-07-23 DIAGNOSIS — M65321 Trigger finger, right index finger: Secondary | ICD-10-CM

## 2020-07-23 DIAGNOSIS — E1141 Type 2 diabetes mellitus with diabetic mononeuropathy: Secondary | ICD-10-CM

## 2020-07-23 DIAGNOSIS — E785 Hyperlipidemia, unspecified: Secondary | ICD-10-CM

## 2020-07-23 DIAGNOSIS — I10 Essential (primary) hypertension: Secondary | ICD-10-CM

## 2020-07-23 MED ORDER — GABAPENTIN 100 MG PO CAPS: 100.0000 mg | ORAL_CAPSULE | Freq: Two times a day (BID) | ORAL | 0 refills | Status: DC | PRN

## 2020-07-23 MED ORDER — LANTUS SOLOSTAR 100 UNIT/ML ~~LOC~~ SOPN: 25.0000 [IU] | PEN_INJECTOR | Freq: Every day | SUBCUTANEOUS | 0 refills | Status: DC

## 2020-07-23 NOTE — Progress Notes (Signed)
BP 127/79   Pulse 76   Temp (!) 96.6 F (35.9 C)   LMP 04/26/2016   SpO2 97%    Subjective:    Patient ID: Sandra Parsons, female    DOB: Sep 14, 1970, 50 y.o.   MRN: [id number]  HPI: Sandra Parsons is a 50 y.o. female presenting on 07/23/2020 for Diabetes and Hypertension   HPI    Pt had a negative covid 19 screening questionnaire.   Encounter Diagnoses  Name Primary?  . Controlled diabetes mellitus type 2 with complications, unspecified whether long term insulin use (HCC) Yes  . Essential hypertension   . Hyperlipidemia, unspecified hyperlipidemia type   . Chronic kidney disease, unspecified CKD stage   . Hypothyroidism, unspecified type   . Dentalgia   . Trigger index finger of right hand   . Diabetic mononeuropathy associated with type 2 diabetes mellitus (HCC)      She has had some dential issues.   Also c/o right hand pain, predominantly index finger and up into hand.  She has seen orthopedist for this trigger finger.   Last time she saw him was in November.    She is still working at [employer].      Relevant past medical, surgical, family and social history reviewed and updated as indicated. Interim medical history since our last visit reviewed. Allergies and medications reviewed and updated.   Current Outpatient Medications:  .  atorvastatin (LIPITOR) 20 MG tablet, TAKE 1 Tablet BY MOUTH ONCE EVERY DAY, Disp: 90 tablet, Rfl: 1 .  insulin glargine (LANTUS SOLOSTAR) 100 UNIT/ML Solostar Pen, Inject 30 Units into the skin daily. (Patient taking differently: Inject 25 Units into the skin daily.), Disp: 1 pen, Rfl: PRN .  JANUMET 50-500 MG tablet, TAKE 1 Tablet  BY MOUTH TWICE DAILY WITH A MEAL, Disp: 180 tablet, Rfl: 1 .  levothyroxine (SYNTHROID) 50 MCG tablet, TAKE 1 Tablet BY MOUTH ONCE EVERY DAY, Disp: 90 tablet, Rfl: 1 .  lisinopril (ZESTRIL) 20 MG tablet, Take 1 tablet (20 mg total) by mouth daily., Disp: 30 tablet, Rfl: 3 .  naproxen sodium (ALEVE) 220 MG  tablet, Take 220 mg by mouth., Disp: , Rfl:  .  triamcinolone (KENALOG) 0.025 % ointment, Apply 1 application topically at bedtime as needed., Disp: 15 g, Rfl: 1 .  gabapentin (NEURONTIN) 100 MG capsule, Take 1 capsule (100 mg total) by mouth 2 (two) times daily as needed. (Patient not taking: No sig reported), Disp: 60 capsule, Rfl: 0    Review of Systems  Per HPI unless specifically indicated above     Objective:    BP 127/79   Pulse 76   Temp (!) 96.6 F (35.9 C)   LMP 04/26/2016   SpO2 97%   Wt Readings from Last 3 Encounters:  04/02/20 175 lb (79.4 kg)  03/14/20 175 lb (79.4 kg)  02/06/20 176 lb (79.8 kg)    Physical Exam Vitals reviewed.  Constitutional:      General: She is not in acute distress.    Appearance: She is well-developed. She is not toxic-appearing.  HENT:     Head: Normocephalic and atraumatic.  Cardiovascular:     Rate and Rhythm: Normal rate and regular rhythm.  Pulmonary:     Effort: Pulmonary effort is normal.     Breath sounds: Normal breath sounds.  Abdominal:     General: Bowel sounds are normal.     Palpations: Abdomen is soft. There is no mass.     Tenderness:  There is no abdominal tenderness.  Musculoskeletal:     Right hand: Swelling and tenderness present. Decreased range of motion. Normal pulse.     Cervical back: Neck supple.     Right lower leg: No edema.     Left lower leg: No edema.     Comments: Some swelling of the joints including index finger and associated MCP.    Lymphadenopathy:     Cervical: No cervical adenopathy.  Skin:    General: Skin is warm and dry.  Neurological:     Mental Status: She is alert and oriented to person, place, and time.  Psychiatric:        Behavior: Behavior normal.     Results for orders placed or performed during the hospital encounter of 07/14/20  Microalbumin, urine  Result Value Ref Range   Microalb, Ur 129.6 (H) Not Estab. ug/mL  Lipid panel  Result Value Ref Range   Cholesterol  135 0 - 200 mg/dL   Triglycerides 211 <941 mg/dL   HDL 42 >74 mg/dL   Total CHOL/HDL Ratio 3.2 RATIO   VLDL 21 0 - 40 mg/dL   LDL Cholesterol 72 0 - 99 mg/dL  Comprehensive metabolic panel  Result Value Ref Range   Sodium 141 135 - 145 mmol/L   Potassium 4.5 3.5 - 5.1 mmol/L   Chloride 112 (H) 98 - 111 mmol/L   CO2 22 22 - 32 mmol/L   Glucose, Bld 101 (H) 70 - 99 mg/dL   BUN 36 (H) 6 - 20 mg/dL   Creatinine, Ser 0.81 (H) 0.44 - 1.00 mg/dL   Calcium 9.4 8.9 - 44.8 mg/dL   Total Protein 6.9 6.5 - 8.1 g/dL   Albumin 3.7 3.5 - 5.0 g/dL   AST 28 15 - 41 U/L   ALT 25 0 - 44 U/L   Alkaline Phosphatase 42 38 - 126 U/L   Total Bilirubin 0.8 0.3 - 1.2 mg/dL   GFR, Estimated 55 (L) >60 mL/min   Anion gap 7 5 - 15  Hemoglobin A1c  Result Value Ref Range   Hgb A1c MFr Bld 6.0 (H) 4.8 - 5.6 %   Mean Plasma Glucose 125.5 mg/dL  TSH  Result Value Ref Range   TSH 6.173 (H) 0.350 - 4.500 uIU/mL      Assessment & Plan:    Encounter Diagnoses  Name Primary?  . Controlled diabetes mellitus type 2 with complications, unspecified whether long term insulin use (HCC) Yes  . Essential hypertension   . Hyperlipidemia, unspecified hyperlipidemia type   . Chronic kidney disease, unspecified CKD stage   . Hypothyroidism, unspecified type   . Dentalgia   . Trigger index finger of right hand   . Diabetic mononeuropathy associated with type 2 diabetes mellitus (HCC)      -labs reviewed with pt -pt counseled to Avoid nsaids due to renal function.  She can use APAP or gabapentin.  Her gabapentin is refilled -pt is on Dental list -DM Foot exam updated -screening mammogram due end of June -pt to Continue current rx -discussed options with pt for finger and she prefers returning to orthopedist because her activities are being limited due to her finger.  will Re-refer to orthopedics for the trigger finger -pt says her [facility] [employer] financial assistance is still active -pt to follow up 4 months.  She  is to contact office sooner prn

## 2020-07-30 ENCOUNTER — Telehealth: Payer: Self-pay

## 2020-07-30 NOTE — Telephone Encounter (Signed)
Client contacted that her [employer] assistance letter is printed and may be picked up at [facility].  Francee Nodal RN [employer]

## 2020-08-06 ENCOUNTER — Ambulatory Visit (INDEPENDENT_AMBULATORY_CARE_PROVIDER_SITE_OTHER): Payer: Self-pay | Admitting: Orthopedic Surgery

## 2020-08-06 ENCOUNTER — Other Ambulatory Visit: Payer: Self-pay

## 2020-08-06 ENCOUNTER — Encounter: Payer: Self-pay | Admitting: Orthopedic Surgery

## 2020-08-06 VITALS — BP 168/88 | HR 69 | Ht 67.0 in | Wt 176.2 lb

## 2020-08-06 DIAGNOSIS — G5601 Carpal tunnel syndrome, right upper limb: Secondary | ICD-10-CM

## 2020-08-06 DIAGNOSIS — M65321 Trigger finger, right index finger: Secondary | ICD-10-CM

## 2020-08-06 DIAGNOSIS — M65332 Trigger finger, left middle finger: Secondary | ICD-10-CM

## 2020-08-06 NOTE — Patient Instructions (Signed)
Wrist splint - right wrist, use at night time  Referral for EMG for right carpal tunnel syndrome  Plan for surgery on left long finger trigger finger - June 13th, f/u 10 days after surgery

## 2020-08-06 NOTE — Progress Notes (Signed)
Orthopaedic Clinic Return  Assessment: Sandra Parsons is a 50 y.o. female with the following: Left long finger trigger finger Right carpal tunnel syndrome Right index trigger finger.    Plan: We previously injected her left long finger with minimal improvement in her symptoms.  She still has locking sensations when trying to extend the finger, and it is painful at the A1 pulley with motion and palpation.  She does not want to try another injection and I think this is reasonable considering she had limited relief following her injection 6 months ago.  We discussed surgical release of the A1 pulley, and she is interested.  Risks and benefits of the surgery, including, but not limited to infection, bleeding, persistent pain, need for further surgery, recurrence of a trigger finger, damage to surrounding structures including blood vessels and nerves and more severe complications associated with anesthesia were discussed with the patient.  The patient has elected to proceed.  She is a diabetic, but it is well controlled at this point.  Her most recent A1c was 6.0.  As long as she continues controlling her blood glucose, she is at a limited increased risk for infection or poor healing.  We will plan to proceed with surgery on 09/01/20.  Surgical Plan  Procedure:  Left long finger trigger release; DOS 09/01/20 Disposition: Outpatient  Anesthesia: [facility] Comorbidities:  Diabetes (well controlled), hypothyroid   Regarding her right wrist, she has symptoms carpal tunnel syndrome, including numbness and tingling, with some atrophy noted within the thenar musculature.  This has worsened over the past 4 months.  Symptoms worsen most nights of the week.  We have provided her with a cock-up wrist splint, to wear at night time.  We have also placed a referral for RUE EMG testing.  Once these results are available, we can discuss them in clinic.  If she requires a carpal tunnel release, we can consider  a right index finger trigger release at that time.      Follow-up: Return for After surgery: DOS 09/01/20.   Subjective:  Chief Complaint  Patient presents with  . Hand Pain    L/ middle finger/its bothering me again. R/index and middle fingers are bothering me again too, some swelling    History of Present Illness: Sandra Parsons is a 50 y.o. female who presents for repeat evaluation of her left long finger trigger finger, as well as her right index trigger finger.  She was last seen in clinic several months ago, at which time both A1 pulleys were injected.  She noted slight improvement in her symptoms, but continues to have significant pain and limited range of motion in the long finger in particular.  Regarding her right hand, she notes continued tenderness at the A1 pulleys to the index and less so to the middle fingers.  However, she has persistent numbness and tingling in these fingers as well.  Occasionally, she has shooting pains in these fingers as well.  She notes worsening symptoms on almost a nightly basis.  She has not worn a brace on her hand.  Review of Systems: No fevers or chills + numbness and tingling No chest pain No shortness of breath No bowel or bladder dysfunction No GI distress No headaches    Objective: BP (!) 168/88   Pulse 69   Ht 5\' 7"  (1.702 m)   Wt 176 lb 4 oz (79.9 kg)   LMP 04/26/2016   BMI 27.60 kg/m   Physical Exam:  Alert and  oriented.  No acute distress.  Normal gait.  Evaluation of left hand demonstrates no obvious deformity.  She does have some stiffness and difficulty with full extension to the long finger.  She notes tenderness to palpation at the A1 pulley.  With passive extension of the MCP joint to the long finger, she notes significant pain in the area of the A1 pulley.  No active triggering in clinic today.  Fingers warm and well-perfused.  Evaluation of the right hand demonstrates no obvious deformity.  Tenderness to palpation  over the A1 pulley to the index finger.  Mild swelling in this area.  No overlying skin changes.  Essentially no sensation in the index and long fingers.  Negative Tinel's at the carpal tunnel.  There appears to be mild atrophy within the thenar eminence.  Fingers are warm and well-perfused.  No active triggering of the index or long finger.  IMAGING: I personally ordered and reviewed the following images:  No new imaging obtained today.  [name], MD 08/06/2020 10:17 PM

## 2020-08-06 NOTE — H&P (View-Only) (Signed)
Orthopaedic Clinic Return  Assessment: Sandra Parsons is a 50 y.o. female with the following: Left long finger trigger finger Right carpal tunnel syndrome Right index trigger finger.    Plan: We previously injected her left long finger with minimal improvement in her symptoms.  She still has locking sensations when trying to extend the finger, and it is painful at the A1 pulley with motion and palpation.  She does not want to try another injection and I think this is reasonable considering she had limited relief following her injection 6 months ago.  We discussed surgical release of the A1 pulley, and she is interested.  Risks and benefits of the surgery, including, but not limited to infection, bleeding, persistent pain, need for further surgery, recurrence of a trigger finger, damage to surrounding structures including blood vessels and nerves and more severe complications associated with anesthesia were discussed with the patient.  The patient has elected to proceed.  She is a diabetic, but it is well controlled at this point.  Her most recent A1c was 6.0.  As long as she continues controlling her blood glucose, she is at a limited increased risk for infection or poor healing.  We will plan to proceed with surgery on 09/01/20.  Surgical Plan  Procedure:  Left long finger trigger release; DOS 09/01/20 Disposition: Outpatient  Anesthesia: [facility] Comorbidities:  Diabetes (well controlled), hypothyroid   Regarding her right wrist, she has symptoms carpal tunnel syndrome, including numbness and tingling, with some atrophy noted within the thenar musculature.  This has worsened over the past 4 months.  Symptoms worsen most nights of the week.  We have provided her with a cock-up wrist splint, to wear at night time.  We have also placed a referral for RUE EMG testing.  Once these results are available, we can discuss them in clinic.  If she requires a carpal tunnel release, we can consider  a right index finger trigger release at that time.      Follow-up: Return for After surgery: DOS 09/01/20.   Subjective:  Chief Complaint  Patient presents with  . Hand Pain    L/ middle finger/its bothering me again. R/index and middle fingers are bothering me again too, some swelling    History of Present Illness: Sandra Parsons is a 50 y.o. female who presents for repeat evaluation of her left long finger trigger finger, as well as her right index trigger finger.  She was last seen in clinic several months ago, at which time both A1 pulleys were injected.  She noted slight improvement in her symptoms, but continues to have significant pain and limited range of motion in the long finger in particular.  Regarding her right hand, she notes continued tenderness at the A1 pulleys to the index and less so to the middle fingers.  However, she has persistent numbness and tingling in these fingers as well.  Occasionally, she has shooting pains in these fingers as well.  She notes worsening symptoms on almost a nightly basis.  She has not worn a brace on her hand.  Review of Systems: No fevers or chills + numbness and tingling No chest pain No shortness of breath No bowel or bladder dysfunction No GI distress No headaches    Objective: BP (!) 168/88   Pulse 69   Ht 5\' 7"  (1.702 m)   Wt 176 lb 4 oz (79.9 kg)   LMP 04/26/2016   BMI 27.60 kg/m   Physical Exam:  Alert and  oriented.  No acute distress.  Normal gait.  Evaluation of left hand demonstrates no obvious deformity.  She does have some stiffness and difficulty with full extension to the long finger.  She notes tenderness to palpation at the A1 pulley.  With passive extension of the MCP joint to the long finger, she notes significant pain in the area of the A1 pulley.  No active triggering in clinic today.  Fingers warm and well-perfused.  Evaluation of the right hand demonstrates no obvious deformity.  Tenderness to palpation  over the A1 pulley to the index finger.  Mild swelling in this area.  No overlying skin changes.  Essentially no sensation in the index and long fingers.  Negative Tinel's at the carpal tunnel.  There appears to be mild atrophy within the thenar eminence.  Fingers are warm and well-perfused.  No active triggering of the index or long finger.  IMAGING: I personally ordered and reviewed the following images:  No new imaging obtained today.  Sandra Sides A Dejour Vos, MD 08/06/2020 10:17 PM  

## 2020-08-26 NOTE — Patient Instructions (Signed)
Sandra Parsons  08/26/2020     @PREFPERIOPPHARMACY @   Your procedure is scheduled on  09/01/2020   Report to [facility] at  [facility]  A.M.   Call this number if you have problems the morning of surgery:  [phone]   Remember:  Do not eat or drink after midnight.                        Take these medicines the morning of surgery with A SIP OF WATER  Gabapentin, levothyroxine.     Please brush your teeth.  Do not wear jewelry, make-up or nail polish.  Do not wear lotions, powders, or perfumes, or deodorant.  Do not shave 48 hours prior to surgery.  Men may shave face and neck.  Do not bring valuables to the hospital.  [facility] is not responsible for any belongings or valuables.  Contacts, dentures or bridgework may not be worn into surgery.  Leave your suitcase in the car.  After surgery it may be brought to your room.  For patients admitted to the hospital, discharge time will be determined by your treatment team.  Patients discharged the day of surgery will not be allowed to drive home and must have someone with them for 24 hours.    Special instructions:  DO NOT smoke tobacco or vape for 24 hours before your procedure.  Please read over the following fact sheets that you were given. Coughing and Deep Breathing, Surgical Site Infection Prevention, Anesthesia Post-op Instructions and Care and Recovery After Surgery       Incision Care, Adult An incision is a cut that a doctor makes in your skin for surgery. Most times, these cuts are closed after surgery. Your cut from surgery may be closed with:  Stitches (sutures).  Staples.  Skin glue.  Skin tape (adhesive) strips. You may need to return to your doctor to have stitches or staples taken out. This may happen many days or many weeks after your surgery. You need to take good care of your cut so it does not get infected. Follow instructions from your doctor about how to care for your cut. Supplies  needed:  Soap and water.  A clean hand towel.  Wound cleanser.  A clean bandage (dressing), if needed.  Cream or ointment, if told by your doctor.  Clean gauze. How to care for your cut from surgery Cleaning your cut Ask your doctor how to clean your cut. You may need to:  Use mild soap and water, or a wound cleanser.  Use a clean gauze to pat your cut dry after you clean it. Changing your bandage  Wash your hands with soap and water for at least 20 seconds before and after you change your bandage. If you cannot use soap and water, use hand sanitizer.  Change your bandage as told by your doctor.  Leave stitches, staples, skin glue, or skin tape strips in place. They may need to stay in place for 2 weeks or longer. If tape strips get loose and curl up, you may trim the loose edges. Do not remove tape strips completely unless your doctor says it is okay.  Put a cream or ointment on your cut. Do this only as told.  Cover your cut with a clean bandage.  Ask your doctor when you can leave your cut uncovered. Checking for infection Check your cut area every day for signs  of infection. Check for:  More redness, swelling, or pain.  More fluid or blood.  Warmth.  Pus or a bad smell.   Follow these instructions at home Medicines  Take over-the-counter and prescription medicines only as told by your doctor.  If you were prescribed an antibiotic medicine, cream, or ointment, use it as told by your doctor. Do not stop using the antibiotic even if your condition improves. Eating and drinking  Eat foods that have a lot of certain nutrients, such as protein, vitamin A, and vitamin C. These foods help your cut heal. ? Foods rich in protein include meat, fish, eggs, dairy, beans, and nuts. ? Foods rich in vitamin A include carrots and dark green, leafy vegetables. ? Foods rich in vitamin C include citrus fruits, tomatoes, broccoli, and peppers.  Drink enough fluid to keep your  pee (urine) pale yellow. General instructions  Do not take baths, swim, use a hot tub, or put your cut underwater until your doctor approves. Ask your doctor if you may take showers. You may only be allowed to take sponge baths.  Limit movement around your cut. This helps with healing. ? Try not to strain, lift, or exercise for the first 2 weeks, or for as long as told by your doctor. ? Return to your normal activities as told by your doctor. Ask your doctor what activities are safe for you.  Do not scratch or pick at your cut. Keep it covered as told by your doctor.  Protect your cut from the sun when you are outside for the first 6 months, or for as long as told by your doctor. Cover up the scar area or put on sunscreen that has an SPF of at least 30.  Do not use any products that contain nicotine or tobacco, such as cigarettes, e-cigarettes, and chewing tobacco. These can delay cut healing after surgery. If you need help quitting, ask your doctor.  Keep all follow-up visits as told by your doctor. This is important.   Contact a doctor if:  You have any of these signs of infection around your cut: ? More redness, swelling, or pain. ? More fluid or blood. ? Warmth. ? Pus or a bad smell.  You have a fever.  You feel like you may vomit (nauseous).  You vomit.  You are dizzy.  Your stitches, staples, skin glue, or tape strips come undone. Get help right away if:  Your cut has a red streak coming from it.  Your cut bleeds through your bandage, and bleeding does not stop with gentle pressure.  Your cut opens up and comes apart.  Your body reacts very badly to an infection. This may include: ? A fever, chills, or feeling cold. ? Feeling mixed up, worried, or nervous. ? Very bad pain. ? Trouble breathing. ? A fast heartbeat. ? Clammy or sweaty skin. ? A rash. These symptoms may be an emergency. Do not wait to see if the symptoms will go away. Get medical help right away.  Call your local emergency services (911 in the [place] Do not drive yourself to the hospital. Summary  Follow instructions from your doctor about how to care for your cut from surgery.  Wash your hands with soap and water for at least 20 seconds before and after you change your bandage. If you cannot use soap and water, use hand sanitizer.  Check your cut area every day for signs of infection.  Keep all follow-up visits as told by  your doctor. This is important. This information is not intended to replace advice given to you by your health care provider. Make sure you discuss any questions you have with your health care provider. Document Revised: 12/27/2018 Document Reviewed: 12/27/2018 Elsevier Patient Education  2021 Elsevier Inc.  General Anesthesia, Adult, Care After This sheet gives you information about how to care for yourself after your procedure. Your health care provider may also give you more specific instructions. If you have problems or questions, contact your health care provider. What can I expect after the procedure? After the procedure, the following side effects are common:  Pain or discomfort at the IV site.  Nausea.  Vomiting.  Sore throat.  Trouble concentrating.  Feeling cold or chills.  Feeling weak or tired.  Sleepiness and fatigue.  Soreness and body aches. These side effects can affect parts of the body that were not involved in surgery. Follow these instructions at home: For the time period you were told by your health care provider:  Rest.  Do not participate in activities where you could fall or become injured.  Do not drive or use machinery.  Do not drink alcohol.  Do not take sleeping pills or medicines that cause drowsiness.  Do not make important decisions or sign legal documents.  Do not take care of children on your own.   Eating and drinking  Follow any instructions from your health care provider about eating or drinking  restrictions.  When you feel hungry, start by eating small amounts of foods that are soft and easy to digest (bland), such as toast. Gradually return to your regular diet.  Drink enough fluid to keep your urine pale yellow.  If you vomit, rehydrate by drinking water, juice, or clear broth. General instructions  If you have sleep apnea, surgery and certain medicines can increase your risk for breathing problems. Follow instructions from your health care provider about wearing your sleep device: ? Anytime you are sleeping, including during daytime naps. ? While taking prescription pain medicines, sleeping medicines, or medicines that make you drowsy.  Have a responsible adult stay with you for the time you are told. It is important to have someone help care for you until you are awake and alert.  Return to your normal activities as told by your health care provider. Ask your health care provider what activities are safe for you.  Take over-the-counter and prescription medicines only as told by your health care provider.  If you smoke, do not smoke without supervision.  Keep all follow-up visits as told by your health care provider. This is important. Contact a health care provider if:  You have nausea or vomiting that does not get better with medicine.  You cannot eat or drink without vomiting.  You have pain that does not get better with medicine.  You are unable to pass urine.  You develop a skin rash.  You have a fever.  You have redness around your IV site that gets worse. Get help right away if:  You have difficulty breathing.  You have chest pain.  You have blood in your urine or stool, or you vomit blood. Summary  After the procedure, it is common to have a sore throat or nausea. It is also common to feel tired.  Have a responsible adult stay with you for the time you are told. It is important to have someone help care for you until you are awake and  alert.  When you  feel hungry, start by eating small amounts of foods that are soft and easy to digest (bland), such as toast. Gradually return to your regular diet.  Drink enough fluid to keep your urine pale yellow.  Return to your normal activities as told by your health care provider. Ask your health care provider what activities are safe for you. This information is not intended to replace advice given to you by your health care provider. Make sure you discuss any questions you have with your health care provider. Document Revised: 11/22/2019 Document Reviewed: 06/21/2019 Elsevier Patient Education  2021 Elsevier Inc.       How to Use Chlorhexidine for Bathing Chlorhexidine gluconate (CHG) is a germ-killing (antiseptic) solution that is used to clean the skin. It can get rid of the bacteria that normally live on the skin and can keep them away for about 24 hours. To clean your skin with CHG, you may be given:  A CHG solution to use in the shower or as part of a sponge bath.  A prepackaged cloth that contains CHG. Cleaning your skin with CHG may help lower the risk for infection:  While you are staying in the intensive care unit of the hospital.  If you have a vascular access, such as a central line, to provide short-term or long-term access to your veins.  If you have a catheter to drain urine from your bladder.  If you are on a ventilator. A ventilator is a machine that helps you breathe by moving air in and out of your lungs.  After surgery. What are the risks? Risks of using CHG include:  A skin reaction.  Hearing loss, if CHG gets in your ears.  Eye injury, if CHG gets in your eyes and is not rinsed out.  The CHG product catching fire. Make sure that you avoid smoking and flames after applying CHG to your skin. Do not use CHG:  If you have a chlorhexidine allergy or have previously reacted to chlorhexidine.  On babies younger than 9 months of age. How to use  CHG solution  Use CHG only as told by your health care provider, and follow the instructions on the label.  Use the full amount of CHG as directed. Usually, this is one bottle. During a shower Follow these steps when using CHG solution during a shower (unless your health care provider gives you different instructions): 1. Start the shower. 2. Use your normal soap and shampoo to wash your face and hair. 3. Turn off the shower or move out of the shower stream. 4. Pour the CHG onto a clean washcloth. Do not use any type of brush or rough-edged sponge. 5. Starting at your neck, lather your body down to your toes. Make sure you follow these instructions: ? If you will be having surgery, pay special attention to the part of your body where you will be having surgery. Scrub this area for at least 1 minute. ? Do not use CHG on your head or face. If the solution gets into your ears or eyes, rinse them well with water. ? Avoid your genital area. ? Avoid any areas of skin that have broken skin, cuts, or scrapes. ? Scrub your back and under your arms. Make sure to wash skin folds. 6. Let the lather sit on your skin for 1-2 minutes or as long as told by your health care provider. 7. Thoroughly rinse your entire body in the shower. Make sure that all body creases and  crevices are rinsed well. 8. Dry off with a clean towel. Do not put any substances on your body afterward--such as powder, lotion, or perfume--unless you are told to do so by your health care provider. Only use lotions that are recommended by the manufacturer. 9. Put on clean clothes or pajamas. 10. If it is the night before your surgery, sleep in clean sheets.   During a sponge bath Follow these steps when using CHG solution during a sponge bath (unless your health care provider gives you different instructions): 1. Use your normal soap and shampoo to wash your face and hair. 2. Pour the CHG onto a clean washcloth. 3. Starting at your neck,  lather your body down to your toes. Make sure you follow these instructions: ? If you will be having surgery, pay special attention to the part of your body where you will be having surgery. Scrub this area for at least 1 minute. ? Do not use CHG on your head or face. If the solution gets into your ears or eyes, rinse them well with water. ? Avoid your genital area. ? Avoid any areas of skin that have broken skin, cuts, or scrapes. ? Scrub your back and under your arms. Make sure to wash skin folds. 4. Let the lather sit on your skin for 1-2 minutes or as long as told by your health care provider. 5. Using a different clean, wet washcloth, thoroughly rinse your entire body. Make sure that all body creases and crevices are rinsed well. 6. Dry off with a clean towel. Do not put any substances on your body afterward--such as powder, lotion, or perfume--unless you are told to do so by your health care provider. Only use lotions that are recommended by the manufacturer. 7. Put on clean clothes or pajamas. 8. If it is the night before your surgery, sleep in clean sheets. How to use CHG prepackaged cloths  Only use CHG cloths as told by your health care provider, and follow the instructions on the label.  Use the CHG cloth on clean, dry skin.  Do not use the CHG cloth on your head or face unless your health care provider tells you to.  When washing with the CHG cloth: ? Avoid your genital area. ? Avoid any areas of skin that have broken skin, cuts, or scrapes. Before surgery Follow these steps when using a CHG cloth to clean before surgery (unless your health care provider gives you different instructions): 1. Using the CHG cloth, vigorously scrub the part of your body where you will be having surgery. Scrub using a back-and-forth motion for 3 minutes. The area on your body should be completely wet with CHG when you are done scrubbing. 2. Do not rinse. Discard the cloth and let the area air-dry. Do  not put any substances on the area afterward, such as powder, lotion, or perfume. 3. Put on clean clothes or pajamas. 4. If it is the night before your surgery, sleep in clean sheets.   For general bathing Follow these steps when using CHG cloths for general bathing (unless your health care provider gives you different instructions). 1. Use a separate CHG cloth for each area of your body. Make sure you wash between any folds of skin and between your fingers and toes. Wash your body in the following order, switching to a new cloth after each step: ? The front of your neck, shoulders, and chest. ? Both of your arms, under your arms, and your hands. ?  Your stomach and groin area, avoiding the genitals. ? Your right leg and foot. ? Your left leg and foot. ? The back of your neck, your back, and your buttocks. 2. Do not rinse. Discard the cloth and let the area air-dry. Do not put any substances on your body afterward--such as powder, lotion, or perfume--unless you are told to do so by your health care provider. Only use lotions that are recommended by the manufacturer. 3. Put on clean clothes or pajamas. Contact a health care provider if:  Your skin gets irritated after scrubbing.  You have questions about using your solution or cloth. Get help right away if:  Your eyes become very red or swollen.  Your eyes itch badly.  Your skin itches badly and is red or swollen.  Your hearing changes.  You have trouble seeing.  You have swelling or tingling in your mouth or throat.  You have trouble breathing.  You swallow any chlorhexidine. Summary  Chlorhexidine gluconate (CHG) is a germ-killing (antiseptic) solution that is used to clean the skin. Cleaning your skin with CHG may help to lower your risk for infection.  You may be given CHG to use for bathing. It may be in a bottle or in a prepackaged cloth to use on your skin. Carefully follow your health care provider's instructions and the  instructions on the product label.  Do not use CHG if you have a chlorhexidine allergy.  Contact your health care provider if your skin gets irritated after scrubbing. This information is not intended to replace advice given to you by your health care provider. Make sure you discuss any questions you have with your health care provider. Document Revised: 08/24/2019 Document Reviewed: 08/24/2019 Elsevier Patient Education  2021 [employer]

## 2020-08-27 ENCOUNTER — Other Ambulatory Visit: Payer: Self-pay | Admitting: Physician Assistant

## 2020-08-28 ENCOUNTER — Other Ambulatory Visit: Payer: Self-pay

## 2020-08-28 ENCOUNTER — Other Ambulatory Visit (HOSPITAL_COMMUNITY): Admission: RE | Admit: 2020-08-28 | Payer: Self-pay | Source: Ambulatory Visit

## 2020-08-28 ENCOUNTER — Encounter (HOSPITAL_COMMUNITY): Payer: Self-pay

## 2020-08-28 ENCOUNTER — Encounter (HOSPITAL_COMMUNITY)
Admission: RE | Admit: 2020-08-28 | Discharge: 2020-08-28 | Disposition: A | Discharge status: Home / Self Care | Payer: Self-pay | Source: Ambulatory Visit | Attending: Orthopedic Surgery | Admitting: Orthopedic Surgery

## 2020-08-28 DIAGNOSIS — Z01818 Encounter for other preprocedural examination: Secondary | ICD-10-CM | POA: Insufficient documentation

## 2020-08-28 HISTORY — DX: Other specified postprocedural states: Z98.890

## 2020-08-28 HISTORY — DX: Nausea with vomiting, unspecified: R11.2

## 2020-08-28 HISTORY — DX: Other specified postprocedural states: R11.2

## 2020-08-28 HISTORY — DX: Other complications of anesthesia, initial encounter: T88.59XA

## 2020-08-28 LAB — COMPREHENSIVE METABOLIC PANEL
ALT: 28 U/L (ref 0–44)
AST: 33 U/L (ref 15–41)
Albumin: 4.1 g/dL (ref 3.5–5.0)
Alkaline Phosphatase: 52 U/L (ref 38–126)
Anion gap: 5 (ref 5–15)
BUN: 39 mg/dL — ABNORMAL HIGH (ref 6–20)
CO2: 21 mmol/L — ABNORMAL LOW (ref 22–32)
Calcium: 9.2 mg/dL (ref 8.9–10.3)
Chloride: 112 mmol/L — ABNORMAL HIGH (ref 98–111)
Creatinine, Ser: 1.34 mg/dL — ABNORMAL HIGH (ref 0.44–1.00)
GFR, Estimated: 49 mL/min — ABNORMAL LOW (ref >60)
Glucose, Bld: 97 mg/dL (ref 70–99)
Potassium: 4.4 mmol/L (ref 3.5–5.1)
Sodium: 138 mmol/L (ref 135–145)
Total Bilirubin: 0.5 mg/dL (ref 0.3–1.2)
Total Protein: 7.6 g/dL (ref 6.5–8.1)

## 2020-08-28 LAB — CBC
HCT: 30.9 % — ABNORMAL LOW (ref 36.0–46.0)
Hemoglobin: 10.2 g/dL — ABNORMAL LOW (ref 12.0–15.0)
MCH: 32.5 pg (ref 26.0–34.0)
MCHC: 33 g/dL (ref 30.0–36.0)
MCV: 98.4 fL (ref 80.0–100.0)
Platelets: 239 10*3/uL (ref 150–400)
RBC: 3.14 MIL/uL — ABNORMAL LOW (ref 3.87–5.11)
RDW: 12.2 % (ref 11.5–15.5)
WBC: 9.1 10*3/uL (ref 4.0–10.5)
nRBC: 0 % (ref 0.0–0.2)

## 2020-08-28 LAB — RAPID URINE DRUG SCREEN, HOSP PERFORMED
Amphetamines: NOT DETECTED
Barbiturates: NOT DETECTED
Benzodiazepines: NOT DETECTED
Cocaine: NOT DETECTED
Opiates: NOT DETECTED
Tetrahydrocannabinol: POSITIVE — AB

## 2020-08-28 LAB — HCG, SERUM, QUALITATIVE: Preg, Serum: NEGATIVE

## 2020-09-01 ENCOUNTER — Ambulatory Visit (HOSPITAL_COMMUNITY): Payer: Self-pay | Admitting: Certified Registered"

## 2020-09-01 ENCOUNTER — Encounter (HOSPITAL_COMMUNITY): Payer: Self-pay | Admitting: Orthopedic Surgery

## 2020-09-01 ENCOUNTER — Ambulatory Visit (HOSPITAL_COMMUNITY)
Admission: RE | Admit: 2020-09-01 | Discharge: 2020-09-01 | Disposition: A | Discharge status: Home / Self Care | Payer: Self-pay | Attending: Orthopedic Surgery | Admitting: Orthopedic Surgery

## 2020-09-01 ENCOUNTER — Encounter (HOSPITAL_COMMUNITY)
Admission: RE | Disposition: A | Discharge status: Home / Self Care | Payer: Self-pay | Source: Home / Self Care | Attending: Orthopedic Surgery

## 2020-09-01 DIAGNOSIS — M65321 Trigger finger, right index finger: Secondary | ICD-10-CM | POA: Insufficient documentation

## 2020-09-01 DIAGNOSIS — I10 Essential (primary) hypertension: Secondary | ICD-10-CM | POA: Insufficient documentation

## 2020-09-01 DIAGNOSIS — G5601 Carpal tunnel syndrome, right upper limb: Secondary | ICD-10-CM | POA: Insufficient documentation

## 2020-09-01 DIAGNOSIS — M65332 Trigger finger, left middle finger: Secondary | ICD-10-CM | POA: Insufficient documentation

## 2020-09-01 DIAGNOSIS — E119 Type 2 diabetes mellitus without complications: Secondary | ICD-10-CM | POA: Insufficient documentation

## 2020-09-01 DIAGNOSIS — E039 Hypothyroidism, unspecified: Secondary | ICD-10-CM | POA: Insufficient documentation

## 2020-09-01 HISTORY — PX: TRIGGER FINGER RELEASE: SHX641

## 2020-09-01 LAB — GLUCOSE, CAPILLARY
Glucose-Capillary: 134 mg/dL — ABNORMAL HIGH (ref 70–99)
Glucose-Capillary: 118 mg/dL — ABNORMAL HIGH (ref 70–99)

## 2020-09-01 SURGERY — RELEASE, A1 PULLEY, FOR TRIGGER FINGER
Anesthesia: General | Laterality: Left

## 2020-09-01 MED ORDER — FENTANYL CITRATE (PF) 100 MCG/2ML IJ SOLN: 25.0000 ug | INTRAMUSCULAR | Status: DC | PRN

## 2020-09-01 MED ORDER — PROPOFOL 10 MG/ML IV BOLUS
INTRAVENOUS | Status: AC
  Filled 2020-09-01: qty 40

## 2020-09-01 MED ORDER — BUPIVACAINE HCL (PF) 0.5 % IJ SOLN
INTRAMUSCULAR | Status: DC | PRN
  Administered 2020-09-01: 9 mL

## 2020-09-01 MED ORDER — MIDAZOLAM HCL 2 MG/2ML IJ SOLN
INTRAMUSCULAR | Status: AC
  Filled 2020-09-01: qty 2

## 2020-09-01 MED ORDER — LACTATED RINGERS IV SOLN: INTRAVENOUS | Status: DC

## 2020-09-01 MED ORDER — LIDOCAINE HCL (PF) 2 % IJ SOLN
INTRAMUSCULAR | Status: AC
  Filled 2020-09-01: qty 5

## 2020-09-01 MED ORDER — HYDROCODONE-ACETAMINOPHEN 5-325 MG PO TABS: 1.0000 | ORAL_TABLET | Freq: Four times a day (QID) | ORAL | 0 refills | Status: DC | PRN

## 2020-09-01 MED ORDER — SODIUM CHLORIDE 0.9 % IR SOLN
Status: DC | PRN
  Administered 2020-09-01: 1000 mL

## 2020-09-01 MED ORDER — ONDANSETRON HCL 4 MG/2ML IJ SOLN
INTRAMUSCULAR | Status: DC | PRN
  Administered 2020-09-01: 4 mg via INTRAVENOUS

## 2020-09-01 MED ORDER — CEFAZOLIN SODIUM-DEXTROSE 2-4 GM/100ML-% IV SOLN
2.0000 g | INTRAVENOUS | Status: AC
  Administered 2020-09-01: 2 g via INTRAVENOUS

## 2020-09-01 MED ORDER — DIPHENHYDRAMINE HCL 50 MG/ML IJ SOLN
INTRAMUSCULAR | Status: AC
  Filled 2020-09-01: qty 1

## 2020-09-01 MED ORDER — CEFAZOLIN SODIUM-DEXTROSE 2-4 GM/100ML-% IV SOLN
INTRAVENOUS | Status: AC
  Filled 2020-09-01: qty 100

## 2020-09-01 MED ORDER — PROPOFOL 10 MG/ML IV BOLUS
INTRAVENOUS | Status: DC | PRN
  Administered 2020-09-01: 200 mg via INTRAVENOUS

## 2020-09-01 MED ORDER — FENTANYL CITRATE (PF) 100 MCG/2ML IJ SOLN
INTRAMUSCULAR | Status: AC
  Filled 2020-09-01: qty 2

## 2020-09-01 MED ORDER — ORAL CARE MOUTH RINSE: 15.0000 mL | Freq: Once | OROMUCOSAL | Status: AC

## 2020-09-01 MED ORDER — PROPOFOL 500 MG/50ML IV EMUL
INTRAVENOUS | Status: DC | PRN
  Administered 2020-09-01: 25 ug/kg/min via INTRAVENOUS

## 2020-09-01 MED ORDER — BUPIVACAINE HCL (PF) 0.5 % IJ SOLN
INTRAMUSCULAR | Status: AC
  Filled 2020-09-01: qty 30

## 2020-09-01 MED ORDER — ONDANSETRON HCL 4 MG PO TABS: 4.0000 mg | ORAL_TABLET | Freq: Three times a day (TID) | ORAL | 0 refills | Status: AC | PRN

## 2020-09-01 MED ORDER — MIDAZOLAM HCL 2 MG/2ML IJ SOLN
INTRAMUSCULAR | Status: DC | PRN
  Administered 2020-09-01: 2 mg via INTRAVENOUS

## 2020-09-01 MED ORDER — ONDANSETRON HCL 4 MG/2ML IJ SOLN
INTRAMUSCULAR | Status: AC
  Filled 2020-09-01: qty 2

## 2020-09-01 MED ORDER — CHLORHEXIDINE GLUCONATE 0.12 % MT SOLN
15.0000 mL | Freq: Once | OROMUCOSAL | Status: AC
  Administered 2020-09-01: 15 mL via OROMUCOSAL

## 2020-09-01 MED ORDER — DIPHENHYDRAMINE HCL 50 MG/ML IJ SOLN
INTRAMUSCULAR | Status: DC | PRN
  Administered 2020-09-01: 12.5 mg via INTRAVENOUS

## 2020-09-01 MED ORDER — SEVOFLURANE IN SOLN
RESPIRATORY_TRACT | Status: AC
  Filled 2020-09-01: qty 250

## 2020-09-01 MED ORDER — FENTANYL CITRATE (PF) 100 MCG/2ML IJ SOLN
INTRAMUSCULAR | Status: DC | PRN
  Administered 2020-09-01 (×4): 25 ug via INTRAVENOUS

## 2020-09-01 SURGICAL SUPPLY — 45 items
APL PRP STRL LF DISP 70% ISPRP (MISCELLANEOUS) ×1
BANDAGE ELASTIC 2 LF NS (GAUZE/BANDAGES/DRESSINGS) ×2 IMPLANT
BANDAGE ESMARK 4X12 BL STRL LF (DISPOSABLE) ×1 IMPLANT
BLADE SURG 15 STRL LF DISP TIS (BLADE) ×1 IMPLANT
BLADE SURG 15 STRL SS (BLADE) ×2
BNDG CMPR 12X4 ELC STRL LF (DISPOSABLE) ×1
BNDG CMPR MED 5X2 ELC HKLP NS (GAUZE/BANDAGES/DRESSINGS) ×1
BNDG CMPR STD VLCR NS LF 5.8X2 (GAUZE/BANDAGES/DRESSINGS) ×1
BNDG COHESIVE 3X5 TAN STRL LF (GAUZE/BANDAGES/DRESSINGS) ×2 IMPLANT
BNDG COHESIVE 4X5 TAN STRL (GAUZE/BANDAGES/DRESSINGS) ×2 IMPLANT
BNDG CONFORM 2 STRL LF (GAUZE/BANDAGES/DRESSINGS) ×2 IMPLANT
BNDG ELASTIC 2X5.8 VLCR NS LF (GAUZE/BANDAGES/DRESSINGS) ×2 IMPLANT
BNDG ESMARK 4X12 BLUE STRL LF (DISPOSABLE) ×2
CHLORAPREP W/TINT 26 (MISCELLANEOUS) ×2 IMPLANT
CLOTH BEACON ORANGE TIMEOUT ST (SAFETY) ×2 IMPLANT
COVER LIGHT HANDLE STERIS (MISCELLANEOUS) ×4 IMPLANT
COVER WAND RF STERILE (DRAPES) ×4 IMPLANT
CUFF TOURN SGL QUICK 18X4 (TOURNIQUET CUFF) ×2 IMPLANT
DECANTER SPIKE VIAL GLASS SM (MISCELLANEOUS) ×2 IMPLANT
DRAPE HALF SHEET 40X57 (DRAPES) ×2 IMPLANT
DRAPE SURG 17X23 STRL (DRAPES) ×2 IMPLANT
ELECT NEEDLE TIP 2.8 STRL (NEEDLE) ×2 IMPLANT
ELECT REM PT RETURN 9FT ADLT (ELECTROSURGICAL) ×2
ELECTRODE REM PT RTRN 9FT ADLT (ELECTROSURGICAL) ×1 IMPLANT
GAUZE SPONGE 4X4 12PLY STRL (GAUZE/BANDAGES/DRESSINGS) ×2 IMPLANT
GAUZE XEROFORM 1X8 LF (GAUZE/BANDAGES/DRESSINGS) ×2 IMPLANT
GAUZE XEROFORM 5X9 LF (GAUZE/BANDAGES/DRESSINGS) ×2 IMPLANT
GLOVE SKINSENSE NS SZ8.0 LF (GLOVE) ×1
GLOVE SKINSENSE STRL SZ8.0 LF (GLOVE) ×1 IMPLANT
GLOVE SRG 8 PF TXTR STRL LF DI (GLOVE) ×1 IMPLANT
GLOVE SURG UNDER POLY LF SZ7 (GLOVE) ×4 IMPLANT
GLOVE SURG UNDER POLY LF SZ8 (GLOVE) ×2
GOWN STRL REUS W/ TWL XL LVL3 (GOWN DISPOSABLE) ×1 IMPLANT
GOWN STRL REUS W/TWL LRG LVL3 (GOWN DISPOSABLE) ×2 IMPLANT
GOWN STRL REUS W/TWL XL LVL3 (GOWN DISPOSABLE) ×2
KIT TURNOVER KIT A (KITS) ×2 IMPLANT
MANIFOLD NEPTUNE II (INSTRUMENTS) ×2 IMPLANT
NEEDLE HYPO 21X1.5 SAFETY (NEEDLE) ×2 IMPLANT
NS IRRIG 1000ML POUR BTL (IV SOLUTION) ×2 IMPLANT
PACK BASIC LIMB (CUSTOM PROCEDURE TRAY) ×2 IMPLANT
PAD ARMBOARD 7.5X6 YLW CONV (MISCELLANEOUS) ×2 IMPLANT
POSITIONER HAND ALUMI XLG (MISCELLANEOUS) ×2 IMPLANT
SET BASIN LINEN APH (SET/KITS/TRAYS/PACK) ×2 IMPLANT
SPONGE GAUZE 2X2 8PLY STRL LF (GAUZE/BANDAGES/DRESSINGS) ×2 IMPLANT
SUT PROLENE NAB BLUE 3-0 30IN (SUTURE) ×2 IMPLANT

## 2020-09-01 NOTE — Interval H&P Note (Signed)
History and Physical Interval Note:  09/01/2020 7:09 AM  Sandra Parsons  has presented today for surgery, with the diagnosis of Left long finger trigger finger.  The various methods of treatment have been discussed with the patient and family. After consideration of risks, benefits and other options for treatment, the patient has consented to  Procedure(s) with comments: RELEASE TRIGGER FINGER/A-1 PULLEY (Left) - Left long finger trigger release as a surgical intervention.  The patient's history has been reviewed, patient examined, no change in status, stable for surgery.  I have reviewed the patient's chart and labs.  Questions were answered to the patient's satisfaction.    Left long finger, stiff, painful and is catching.  She has attempted steroid injections without sustained relief. Prepared to proceed with surgery.    [name]

## 2020-09-01 NOTE — Anesthesia Preprocedure Evaluation (Signed)
Anesthesia Evaluation  Patient identified by MRN, date of birth, ID band Patient awake    Reviewed: Allergy & Precautions, H&P , NPO status , Patient's Chart, lab work & pertinent test results, reviewed documented beta blocker date and time   History of Anesthesia Complications (+) PONV and history of anesthetic complications  Airway Mallampati: II  TM Distance: >3 FB Neck ROM: full    Dental no notable dental hx.    Pulmonary neg pulmonary ROS, former smoker,    Pulmonary exam normal breath sounds clear to auscultation       Cardiovascular Exercise Tolerance: Good hypertension, negative cardio ROS   Rhythm:regular Rate:Normal     Neuro/Psych PSYCHIATRIC DISORDERS Depression negative neurological ROS     GI/Hepatic negative GI ROS, (+) Hepatitis -, C  Endo/Other  negative endocrine ROSdiabetes  Renal/GU negative Renal ROS  negative genitourinary   Musculoskeletal   Abdominal   Peds  Hematology negative hematology ROS (+)   Anesthesia Other Findings   Reproductive/Obstetrics negative OB ROS                             Anesthesia Physical Anesthesia Plan  ASA: 2  Anesthesia Plan: General and General LMA   Post-op Pain Management:    Induction:   PONV Risk Score and Plan: Ondansetron  Airway Management Planned:   Additional Equipment:   Intra-op Plan:   Post-operative Plan:   Informed Consent: I have reviewed the patients History and Physical, chart, labs and discussed the procedure including the risks, benefits and alternatives for the proposed anesthesia with the patient or authorized representative who has indicated his/her understanding and acceptance.     Dental Advisory Given  Plan Discussed with: CRNA  Anesthesia Plan Comments:         Anesthesia Quick Evaluation

## 2020-09-01 NOTE — Anesthesia Procedure Notes (Signed)
Procedure Name: LMA Insertion Date/Time: 09/01/2020 7:34 AM Performed by: Lorin Glass, CRNA Pre-anesthesia Checklist: Patient identified, Emergency Drugs available and Suction available Patient Re-evaluated:Patient Re-evaluated prior to induction Oxygen Delivery Method: Circle system utilized Preoxygenation: Pre-oxygenation with 100% oxygen Induction Type: IV induction LMA: LMA inserted LMA Size: 4.0 Number of attempts: 1 Tube secured with: Tape Dental Injury: Teeth and Oropharynx as per pre-operative assessment

## 2020-09-01 NOTE — Transfer of Care (Signed)
Immediate Anesthesia Transfer of Care Note  Patient: Media planner  Procedure(s) Performed: LEFT LONG FINGER  A-1 PULLEY RELEASE (Left)  Patient Location: PACU  Anesthesia Type:General  Level of Consciousness: drowsy  Airway & Oxygen Therapy: Patient Spontanous Breathing and Patient connected to nasal cannula oxygen  Post-op Assessment: Report given to RN and Post -op Vital signs reviewed and stable  Post vital signs: Reviewed and stable  Last Vitals:  Vitals Value Taken Time  BP 110/64   Temp 97.6   Pulse 68   Resp 12   SpO2 96%     Last Pain:  Vitals:   09/01/20 0642  TempSrc: Oral  PainSc: 7       Patients Stated Pain Goal: 9 (09/01/20 4268)  Complications: No notable events documented.

## 2020-09-01 NOTE — Discharge Instructions (Signed)
  Bama Hanselman A. Dallas Schimke, MD MS [facility] [address] [place],  [place]  [address] Phone: [phone] Fax: [phone]    POST-OPERATIVE INSTRUCTIONS   WOUND CARE You may remove your bandage on postop day 3 and get the hand wet.  No ointments or lotions to be applied to the incision.  Do not submerge the incision for 1 month.  FOLLOW-UP If you develop a Fever (>101.5), Redness or Drainage from the surgical incision site, please call our office to arrange for an evaluation. Please call the office to schedule a follow-up appointment for your incision check if you do not already have one, 10-14 days post-operatively.  IF YOU HAVE ANY QUESTIONS, PLEASE FEEL FREE TO CALL OUR OFFICE.  HELPFUL INFORMATION  You should wean off your narcotic medicines as soon as you are able.  Most patients will be off or using minimal narcotics before their first postop appointment.   You may be more comfortable sleeping in a semi-seated position the first few nights following surgery.  Keep a pillow propped under the elbow and forearm for comfort.  If you have a recliner type of chair it might be beneficial.    We suggest you use the pain medication the first night prior to going to bed, in order to ease any pain when the anesthesia wears off. You should avoid taking pain medications on an empty stomach as it will make you nauseous.  Do not drink alcoholic beverages or take illicit drugs when taking pain medications.  You may return to work/school in the next couple of days when you feel up to it. Desk work and typing is fine.  Pain medication may make you constipated.  Below are a few solutions to try in this order: Decrease the amount of pain medication if you aren't having pain. Drink lots of decaffeinated fluids. Drink prune juice and/or each dried prunes  If the first 3 don't work start with additional solutions Take Colace - an over-the-counter stool softener Take  Senokot - an over-the-counter laxative Take Miralax - a stronger over-the-counter laxative

## 2020-09-01 NOTE — Anesthesia Postprocedure Evaluation (Signed)
Anesthesia Post Note  Patient: Product manager) Performed: LEFT LONG FINGER  A-1 PULLEY RELEASE (Left)  Patient location during evaluation: Phase II Anesthesia Type: General Level of consciousness: awake Pain management: pain level controlled Vital Signs Assessment: post-procedure vital signs reviewed and stable Respiratory status: spontaneous breathing and respiratory function stable Cardiovascular status: blood pressure returned to baseline and stable Postop Assessment: no headache and no apparent nausea or vomiting Anesthetic complications: no Comments: Late entry   No notable events documented.   Last Vitals:  Vitals:   09/01/20 0830 09/01/20 0900  BP: (!) 167/88 (!) 176/94  Pulse: 75 72  Resp: 16 16  Temp:  (!) 36.3 C  SpO2: 97% 97%    Last Pain:  Vitals:   09/01/20 0900  TempSrc: Oral  PainSc: 0-No pain                 Windell Norfolk

## 2020-09-01 NOTE — Op Note (Signed)
Orthopaedic Surgery Operative Note (CSN: [id number])  Sandra Parsons  03-09-1971 Date of Surgery: 09/01/2020   Diagnoses:  Left long finger trigger finger  Procedure: Trigger finger release Left Long finger   Operative Finding Successful completion of the planned procedure.  A1 pulley thickened, no injury to the tendon.  Tendons with complete excursion at completion of the case.  Finger with full flexion, but remained stiff   Post-Op Diagnosis: Same Surgeons:Primary: [name], MD Assistants:  None Location: [facility] OR ROOM 4 Anesthesia: General with local anesthesia Antibiotics: Ancef 2 g Tourniquet time:  Total Tourniquet Time Documented: Upper Arm (Left) - 25 minutes Total: Upper Arm (Left) - 25 minutes  Estimated Blood Loss: Minimal Complications: None Specimens: None Implants:   Indications for Surgery:   Sandra Parsons is a 50 y.o. female with a Left Long finger trigger finger.  The patient is complaining of pain over the A1 pulley with progressively worsening triggering.  They have previously had injections, which improved the symptoms, but did not resolve the current symptoms.  Benefits and risks of operative and nonoperative management were discussed prior to surgery with patient and informed consent form was completed.  Specific risks including infection, need for additional surgery, damage to surrounding structures, recurrence and more severe complications associated with anesthesia were discussed.  They have elected to proceed with surgery.    Procedure:   The patient was identified properly. Informed consent was obtained and the surgical site was marked. The patient was taken to the OR where the above stated anesthesia was induced.  The patient was positioned supine on a hand table.  The left arm was prepped and draped in the usual sterile fashion.  Timeout was performed before the beginning of the case.  Tourniquet was used for the above duration.  The patient  received antibiotics prior to the incision.   We made an incision directly overlying the A1 pulley to the Long finger.  We carefully incised sharply through the skin only.  We continued with blunt dissection to the level of the A1 pulley.  We identified the adjacent neurovascular bundles and they were protected throughout the case.  The leading edge of the A1 pulley was identified and incised sharply with a knife.  We then used scissors to completely release the pulley distally, and then proximally.  The A1 pulley was noted to be very thick with some irritation of the underlying tendon.  There were no nodules on the tendon. We then used a tendon hook to remove the tendon from its usual position to ensure that the A1 pulley was released and the tendon moving freely.  We passively flexed and extended the Long finger and noted there were no additional adhesions our catching.  We irrigated the wound copiously.  We closed the incision with multiple interrupted sutures.  Marcaine 0.5% was then injected at the incision site.  Sterile dressing was placed.  Patient was awoken taken to PACU in stable condition.   Post-operative plan:  The patient will be discharged home. They can remove the bulky dressing in 3 days.   DVT prophylaxis not indicated in this ambulatory upper extremity patient without significant risk factors.    Patient will be provided with a limited prescription of narcotics for severe pain.  OTC medications can be used as needed.  Follow up plan will be scheduled in approximately 10-14 days for suture removal and incision check.

## 2020-09-02 ENCOUNTER — Encounter (HOSPITAL_COMMUNITY): Payer: Self-pay | Admitting: Orthopedic Surgery

## 2020-09-03 ENCOUNTER — Other Ambulatory Visit: Payer: Self-pay

## 2020-09-03 ENCOUNTER — Ambulatory Visit (INDEPENDENT_AMBULATORY_CARE_PROVIDER_SITE_OTHER): Payer: Self-pay | Admitting: Physical Medicine and Rehabilitation

## 2020-09-03 ENCOUNTER — Encounter: Payer: Self-pay | Admitting: Physical Medicine and Rehabilitation

## 2020-09-03 DIAGNOSIS — R202 Paresthesia of skin: Secondary | ICD-10-CM

## 2020-09-03 NOTE — Progress Notes (Addendum)
Sandra Parsons - 50 y.o. female MRN [id number]  Date of birth: [birth year]  Office Visit Note: Visit Date: 09/03/2020 PCP: Jacquelin Hawking, PA-C Referred by: Jacquelin Hawking, PA-C  Subjective: Chief Complaint  Patient presents with   Right Hand - Pain, Numbness   HPI:  Sandra Parsons is a 50 y.o. female who comes in today at the request of Dr. Thane Edu for electrodiagnostic study of the Right upper extremities.  Patient is Right hand dominant.  She reports a chronic prolonged history of numbness in the hand but with worsening over the last 4 months where it has become constant.  Most of the numbness and tingling is in the index and middle finger and sometimes thumb.  She reports an incident at work where she burned herself and could not feel it.  She does use Aleve and has used bracing.  She does have diabetes and has a history of diabetic neuropathy as well as retinopathy.  She has had some prior electrodiagnostic study but not recently of the hands.  She denies any frank radicular symptoms.  She recently underwent left index finger trigger finger release.  She is experiencing right trigger finger as well.   ROS Otherwise per HPI.  Assessment & Plan: Visit Diagnoses:    ICD-10-CM   1. Paresthesia of skin  R20.2 NCV with EMG (electromyography)      Plan: Impression: The above electrodiagnostic study is ABNORMAL and reveals evidence of a severe right median nerve entrapment at the wrist (carpal tunnel syndrome) affecting sensory and motor components. The lesion is characterized by sensory and motor demyelination with  evidence of significant axonal injury.  The nerve conduction study actually appears worse than the EMG results suggesting may be there is more of a component of conduction block which may portend a better outcome.  The above electrodiagnostic study reveals evidence highly suggestive of underlying peripheral neuropathy.  Recommendations: 1.  Follow-up with referring  physician. 2.  Continue current management of symptoms. 3.  Suggest surgical evaluation.  Meds & Orders: No orders of the defined types were placed in this encounter.   Orders Placed This Encounter  Procedures   NCV with EMG (electromyography)    Follow-up: Return for Thane Edu, MD.   Procedures: No procedures performed  EMG & NCV Findings: Evaluation of the right median motor nerve showed prolonged distal onset latency (5.5 ms), reduced amplitude (0.0 mV), and decreased conduction velocity (Elbow-Wrist, 42 m/s).  The right ulnar motor nerve showed decreased conduction velocity (B Elbow-Wrist, 51 m/s) and decreased conduction velocity (A Elbow-B Elbow, 50 m/s).  The right median (across palm) sensory nerve showed no response (Wrist) and prolonged distal peak latency (Palm, 5.3 ms).  The right ulnar sensory nerve showed prolonged distal peak latency (3.8 ms) and decreased conduction velocity (Wrist-5th Digit, 37 m/s).  All remaining nerves (as indicated in the following tables) were within normal limits.    Needle evaluation of the right abductor pollicis brevis muscle showed moderately increased spontaneous activity and diminished recruitment.  All remaining muscles (as indicated in the following table) showed no evidence of electrical instability.    Impression: The above electrodiagnostic study is ABNORMAL and reveals evidence of a severe right median nerve entrapment at the wrist (carpal tunnel syndrome) affecting sensory and motor components. The lesion is characterized by sensory and motor demyelination with  evidence of significant axonal injury.  The nerve conduction study actually appears worse than the EMG results suggesting may be there is more of  a component of conduction block which may portend a better outcome.  The above electrodiagnostic study reveals evidence highly suggestive of underlying peripheral neuropathy.  Recommendations: 1.  Follow-up with referring physician. 2.   Continue current management of symptoms. 3.  Suggest surgical evaluation.  ___________________________ Naaman Plummer FAAPMR Board Certified, American Board of Physical Medicine and Rehabilitation    Nerve Conduction Studies Anti Sensory Summary Table   Stim Site NR Peak (ms) Norm Peak (ms) P-T Amp (V) Norm P-T Amp Site1 Site2 Delta-P (ms) Dist (cm) Vel (m/s) Norm Vel (m/s)  Right Median Acr Palm Anti Sensory (2nd Digit)  31.1C  Wrist *NR  <3.6  >10 Wrist Palm  0.0    Palm    *5.3 <2.0 17.5         Right Radial Anti Sensory (Base 1st Digit)  30.6C  Wrist    2.8 <3.1 13.1  Wrist Base 1st Digit 2.8 0.0    Right Ulnar Anti Sensory (5th Digit)  31.2C  Wrist    *3.8 <3.7 16.8 >15.0 Wrist 5th Digit 3.8 14.0 *37 >38   Motor Summary Table   Stim Site NR Onset (ms) Norm Onset (ms) O-P Amp (mV) Norm O-P Amp Site1 Site2 Delta-0 (ms) Dist (cm) Vel (m/s) Norm Vel (m/s)  Right Median Motor (Abd Poll Brev)  31C  Wrist    *5.5 <4.2 *0.0 >5 Elbow Wrist 4.3 18.0 *42 >50  Elbow    9.8  0.0         Right Ulnar Motor (Abd Dig Min)  31.6C  Wrist    3.6 <4.2 6.0 >3 B Elbow Wrist 4.1 21.0 *51 >53  B Elbow    7.7  6.3  A Elbow B Elbow 2.0 10.0 *50 >53  A Elbow    9.7  5.9          EMG   Side Muscle Nerve Root Ins Act Fibs Psw Amp Dur Poly Recrt Int Dennie Bible Comment  Right Abd Poll Brev Median C8-T1 Nml *2+ *2+ Nml Nml 0 *Reduced Nml   Right 1stDorInt Ulnar C8-T1 Nml Nml Nml Nml Nml 0 Nml Nml   Right PronatorTeres Median C6-7 Nml Nml Nml Nml Nml 0 Nml Nml     Nerve Conduction Studies Anti Sensory Left/Right Comparison   Stim Site L Lat (ms) R Lat (ms) L-R Lat (ms) L Amp (V) R Amp (V) L-R Amp (%) Site1 Site2 L Vel (m/s) R Vel (m/s) L-R Vel (m/s)  Median Acr Palm Anti Sensory (2nd Digit)  31.1C  Wrist       Wrist Palm     Palm  *5.3   17.5        Radial Anti Sensory (Base 1st Digit)  30.6C  Wrist  2.8   13.1  Wrist Base 1st Digit     Ulnar Anti Sensory (5th Digit)  31.2C  Wrist  *3.8   16.8   Wrist 5th Digit  *37    Motor Left/Right Comparison   Stim Site L Lat (ms) R Lat (ms) L-R Lat (ms) L Amp (mV) R Amp (mV) L-R Amp (%) Site1 Site2 L Vel (m/s) R Vel (m/s) L-R Vel (m/s)  Median Motor (Abd Poll Brev)  31C  Wrist  *5.5   *0.0  Elbow Wrist  *42   Elbow  9.8   0.0        Ulnar Motor (Abd Dig Min)  31.6C  Wrist  3.6   6.0  B Elbow Wrist  *51  B Elbow  7.7   6.3  A Elbow B Elbow  *50   A Elbow  9.7   5.9           Waveforms:            Clinical History: No specialty comments available.     Objective:  VS:  HT:    WT:   BMI:     BP:   HR: bpm  TEMP: ( )  RESP:  Physical Exam Musculoskeletal:        General: No swelling, tenderness or deformity.     Comments: Inspection reveals flattening atrophy of the right APB but no atrophy of the left APB and bilateral FDI or hand intrinsics. There is no swelling, color changes, allodynia or dystrophic changes. There is 5 out of 5 strength in the bilateral wrist extension, finger abduction and long finger flexion.  There is decreased sensation to light touch in a median nerve distribution on the right.  There is a negative Hoffmann's test bilaterally.  Skin:    General: Skin is warm and dry.     Findings: No erythema or rash.  Neurological:     General: No focal deficit present.     Mental Status: She is alert and oriented to person, place, and time.     Motor: No weakness or abnormal muscle tone.     Coordination: Coordination normal.  Psychiatric:        Mood and Affect: Mood normal.        Behavior: Behavior normal.     Imaging: No results found.

## 2020-09-03 NOTE — Progress Notes (Signed)
Numbness in second and third fingers of right hand and sometimes the thumb. Burned herself at work and could not feel it. Constant for 4 months. Some pain, but this is relieved with Aleve. Right hand dominant No lotion per patient Numeric Pain Rating Scale and Functional Assessment Average Pain 8   In the last MONTH (on 0-10 scale) has pain interfered with the following?  1. General activity like being  able to carry out your everyday physical activities such as walking, climbing stairs, carrying groceries, or moving a chair?  Rating(8)

## 2020-09-04 NOTE — Procedures (Signed)
EMG & NCV Findings: Evaluation of the right median motor nerve showed prolonged distal onset latency (5.5 ms), reduced amplitude (0.0 mV), and decreased conduction velocity (Elbow-Wrist, 42 m/s).  The right ulnar motor nerve showed decreased conduction velocity (B Elbow-Wrist, 51 m/s) and decreased conduction velocity (A Elbow-B Elbow, 50 m/s).  The right median (across palm) sensory nerve showed no response (Wrist) and prolonged distal peak latency (Palm, 5.3 ms).  The right ulnar sensory nerve showed prolonged distal peak latency (3.8 ms) and decreased conduction velocity (Wrist-5th Digit, 37 m/s).  All remaining nerves (as indicated in the following tables) were within normal limits.    Needle evaluation of the right abductor pollicis brevis muscle showed moderately increased spontaneous activity and diminished recruitment.  All remaining muscles (as indicated in the following table) showed no evidence of electrical instability.    Impression: The above electrodiagnostic study is ABNORMAL and reveals evidence of a severe right median nerve entrapment at the wrist (carpal tunnel syndrome) affecting sensory and motor components. The lesion is characterized by sensory and motor demyelination with  evidence of significant axonal injury.  The nerve conduction study actually appears worse than the EMG results suggesting may be there is more of a component of conduction block which may portend a better outcome.  The above electrodiagnostic study reveals evidence highly suggestive of underlying peripheral neuropathy.  Recommendations: 1.  Follow-up with referring physician. 2.  Continue current management of symptoms. 3.  Suggest surgical evaluation.  ___________________________ Naaman Plummer FAAPMR Board Certified, American Board of Physical Medicine and Rehabilitation    Nerve Conduction Studies Anti Sensory Summary Table   Stim Site NR Peak (ms) Norm Peak (ms) P-T Amp (V) Norm P-T Amp Site1  Site2 Delta-P (ms) Dist (cm) Vel (m/s) Norm Vel (m/s)  Right Median Acr Palm Anti Sensory (2nd Digit)  31.1C  Wrist *NR  <3.6  >10 Wrist Palm  0.0    Palm    *5.3 <2.0 17.5         Right Radial Anti Sensory (Base 1st Digit)  30.6C  Wrist    2.8 <3.1 13.1  Wrist Base 1st Digit 2.8 0.0    Right Ulnar Anti Sensory (5th Digit)  31.2C  Wrist    *3.8 <3.7 16.8 >15.0 Wrist 5th Digit 3.8 14.0 *37 >38   Motor Summary Table   Stim Site NR Onset (ms) Norm Onset (ms) O-P Amp (mV) Norm O-P Amp Site1 Site2 Delta-0 (ms) Dist (cm) Vel (m/s) Norm Vel (m/s)  Right Median Motor (Abd Poll Brev)  31C  Wrist    *5.5 <4.2 *0.0 >5 Elbow Wrist 4.3 18.0 *42 >50  Elbow    9.8  0.0         Right Ulnar Motor (Abd Dig Min)  31.6C  Wrist    3.6 <4.2 6.0 >3 B Elbow Wrist 4.1 21.0 *51 >53  B Elbow    7.7  6.3  A Elbow B Elbow 2.0 10.0 *50 >53  A Elbow    9.7  5.9          EMG   Side Muscle Nerve Root Ins Act Fibs Psw Amp Dur Poly Recrt Int Dennie Bible Comment  Right Abd Poll Brev Median C8-T1 Nml *2+ *2+ Nml Nml 0 *Reduced Nml   Right 1stDorInt Ulnar C8-T1 Nml Nml Nml Nml Nml 0 Nml Nml   Right PronatorTeres Median C6-7 Nml Nml Nml Nml Nml 0 Nml Nml     Nerve Conduction Studies Anti Sensory Left/Right Comparison  Stim Site L Lat (ms) R Lat (ms) L-R Lat (ms) L Amp (V) R Amp (V) L-R Amp (%) Site1 Site2 L Vel (m/s) R Vel (m/s) L-R Vel (m/s)  Median Acr Palm Anti Sensory (2nd Digit)  31.1C  Wrist       Wrist Palm     Palm  *5.3   17.5        Radial Anti Sensory (Base 1st Digit)  30.6C  Wrist  2.8   13.1  Wrist Base 1st Digit     Ulnar Anti Sensory (5th Digit)  31.2C  Wrist  *3.8   16.8  Wrist 5th Digit  *37    Motor Left/Right Comparison   Stim Site L Lat (ms) R Lat (ms) L-R Lat (ms) L Amp (mV) R Amp (mV) L-R Amp (%) Site1 Site2 L Vel (m/s) R Vel (m/s) L-R Vel (m/s)  Median Motor (Abd Poll Brev)  31C  Wrist  *5.5   *0.0  Elbow Wrist  *42   Elbow  9.8   0.0        Ulnar Motor (Abd Dig Min)  31.6C  Wrist   3.6   6.0  B Elbow Wrist  *51   B Elbow  7.7   6.3  A Elbow B Elbow  *50   A Elbow  9.7   5.9           Waveforms:

## 2020-09-08 ENCOUNTER — Other Ambulatory Visit: Payer: Self-pay | Admitting: Physician Assistant

## 2020-09-08 DIAGNOSIS — Z1231 Encounter for screening mammogram for malignant neoplasm of breast: Secondary | ICD-10-CM

## 2020-09-10 ENCOUNTER — Other Ambulatory Visit: Payer: Self-pay

## 2020-09-10 ENCOUNTER — Encounter: Payer: Self-pay | Admitting: Orthopedic Surgery

## 2020-09-10 ENCOUNTER — Ambulatory Visit (INDEPENDENT_AMBULATORY_CARE_PROVIDER_SITE_OTHER): Payer: Self-pay | Admitting: Orthopedic Surgery

## 2020-09-10 DIAGNOSIS — M65332 Trigger finger, left middle finger: Secondary | ICD-10-CM

## 2020-09-10 NOTE — Progress Notes (Signed)
Orthopaedic Postop Note  Assessment: Sandra Parsons is a 50 y.o. female s/p left long finger trigger release  DOS: 09/01/2020  Plan: Stitches removed, steri strips placed Ok to advance activities with left hand Do not soak hand Limit lifting until range of motion is full and painless She also has severe right carpal tunnel syndrome and would like to proceed with surgery.  She will discuss with her work to determine timing of surgery.  May also proceed with trigger finger release on right hand also.  Follow up in 4 weeks; can discuss further surgery at that time.    Follow-up: Return in about 4 weeks (around 10/08/2020). XR at next visit: None  Subjective:  Chief Complaint  Patient presents with   Routine Post Op    Remove sutures/DOS 09/01/2020    History of Present Illness: Sandra Parsons is a 50 y.o. female who presents following the above stated procedure.  Surgery was 2 weeks ago.  She has done well.  She needed some pain medication for a couple of days. She notes less stiffness in her finger.  She is pleased with her ability to fully extend the long finger.  No issues with her incision.  A few days after surgery, she had EMG done on her RUE.  She continues to have numbness in the right thumb, index and long fingers.   Review of Systems: No fevers or chills No numbness or tingling No Chest Pain No shortness of breath   Objective: LMP 04/26/2016   Physical Exam:  Alert and oriented, no acute distress  Left hand surgical incision is healing well.  No surrounding erythema or drainage. Swelling around the volar MCP to the long finger.  No bruising.  Able to fully extend the long finger.  Able to make a full fist with some discomfort. Sensation intact distally to all fingers.   Right hand with some atrophy within the thenar musculature.  Positive Tinel's at the carpal tunnel.  Decreased sensation in the median nerve distribution.    IMAGING: I personally ordered and  reviewed the following images:  EMG results for the right hand significant for severe right carpal tunnel syndrome.   [name], MD 09/10/2020 9:56 AM

## 2020-09-15 ENCOUNTER — Other Ambulatory Visit: Payer: Self-pay

## 2020-09-15 ENCOUNTER — Ambulatory Visit (HOSPITAL_COMMUNITY)
Admission: RE | Admit: 2020-09-15 | Discharge: 2020-09-15 | Disposition: A | Discharge status: Home / Self Care | Payer: Self-pay | Source: Ambulatory Visit | Attending: Physician Assistant | Admitting: Physician Assistant

## 2020-09-15 DIAGNOSIS — Z1231 Encounter for screening mammogram for malignant neoplasm of breast: Secondary | ICD-10-CM | POA: Insufficient documentation

## 2020-09-17 ENCOUNTER — Other Ambulatory Visit (HOSPITAL_COMMUNITY): Payer: Self-pay | Admitting: Physician Assistant

## 2020-09-17 DIAGNOSIS — R928 Other abnormal and inconclusive findings on diagnostic imaging of breast: Secondary | ICD-10-CM

## 2020-09-23 ENCOUNTER — Ambulatory Visit (HOSPITAL_COMMUNITY)
Admission: RE | Admit: 2020-09-23 | Discharge: 2020-09-23 | Disposition: A | Discharge status: Home / Self Care | Payer: Self-pay | Source: Ambulatory Visit | Attending: Physician Assistant | Admitting: Physician Assistant

## 2020-09-23 ENCOUNTER — Other Ambulatory Visit: Payer: Self-pay

## 2020-09-23 DIAGNOSIS — R928 Other abnormal and inconclusive findings on diagnostic imaging of breast: Secondary | ICD-10-CM | POA: Insufficient documentation

## 2020-10-02 ENCOUNTER — Encounter: Payer: Self-pay | Admitting: Orthopedic Surgery

## 2020-10-08 ENCOUNTER — Other Ambulatory Visit: Payer: Self-pay | Admitting: Physician Assistant

## 2020-10-09 MED ORDER — GABAPENTIN 100 MG PO CAPS: 100.0000 mg | ORAL_CAPSULE | Freq: Two times a day (BID) | ORAL | 0 refills | Status: DC | PRN

## 2020-10-15 ENCOUNTER — Encounter: Payer: Self-pay | Admitting: Orthopedic Surgery

## 2020-10-22 ENCOUNTER — Ambulatory Visit (INDEPENDENT_AMBULATORY_CARE_PROVIDER_SITE_OTHER): Payer: Self-pay | Admitting: Orthopedic Surgery

## 2020-10-22 ENCOUNTER — Encounter: Payer: Self-pay | Admitting: Orthopedic Surgery

## 2020-10-22 ENCOUNTER — Other Ambulatory Visit: Payer: Self-pay

## 2020-10-22 ENCOUNTER — Ambulatory Visit: Payer: Self-pay | Admitting: Orthopedic Surgery

## 2020-10-22 DIAGNOSIS — M65331 Trigger finger, right middle finger: Secondary | ICD-10-CM

## 2020-10-22 DIAGNOSIS — M65321 Trigger finger, right index finger: Secondary | ICD-10-CM

## 2020-10-22 DIAGNOSIS — G5601 Carpal tunnel syndrome, right upper limb: Secondary | ICD-10-CM

## 2020-10-22 NOTE — Progress Notes (Addendum)
Orthopaedic Postop Note  Assessment: Sandra Parsons is a 50 y.o. female s/p left long finger trigger release  DOS: 09/01/2020  Plan: Left long finger stiffness and pain has improved.  She is very happy with the results.  At this point, she continues to have pain, tenderness and triggering of the right index and long fingers.  She also has documented evidence of severe right carpal tunnel syndrome.  She has tried injections for trigger finger in her right hand, without sustained relief of her symptoms.  She has been able to discuss everything with her work, and would like to proceed with surgery on her right hand.  She would like to proceed with multiple procedures on the right hand on December 08, 2020.  Risks and benefits of the surgery, including, but not limited to infection, bleeding, persistent pain, need for further surgery, incomplete resolution of her symptoms - specifically the numbness and tingling, and more severe complications associated with anesthesia were discussed with the patient.  The patient has elected to proceed.  Surgical Plan  Procedure: Right carpal tunnel release; right index and long finger trigger finger release Disposition: Outpatient Anesthesia: General, with local anesthesia Medical Comorbidities: Diabetes (well-controlled), hypothyroid   Follow-up: Return for After surgery; DOS 12/08/20. XR at next visit: None  Subjective:  Chief Complaint  Patient presents with   Routine Post Op    DOS 09/01/20/Trigger Finger/left middle finger/pt states left hand is feeling good    History of Present Illness: Sandra Parsons is a 50 y.o. female who presents following the above stated procedure.  Surgery on the left long finger was approximately 7 weeks ago.  She is doing well.  She is not having any pain.  Stiffness has improved following surgery.  She denies any catching or locking sensations.  She is very pleased with the results.  She has noticed some tenderness  around the incision, but this is improving.  She has obtained EMG for her right hand, which demonstrates severe carpal tunnel syndrome in her right hand.  She is also continued to have pain, tenderness and locking of the index and long finger.  She has discussed the possibility of proceeding with another surgery with her work, and they are able to accommodate this.  She would like to plan for surgery in approximately 1 month.  No medications at this time.  She has returned to work.  Review of Systems: No fevers or chills No numbness or tingling No Chest Pain No shortness of breath   Objective: LMP 04/26/2016   Physical Exam:  Alert and oriented, no acute distress  Left hand surgical incision is healing well.  No surrounding erythema or drainage.  There is some scar formation underneath the surgical incision.  She is able to fully extend the long finger without pain.  No catching or triggering noted in clinic today.  Right hand with some atrophy within the thenar musculature.  Positive Tinel's at the carpal tunnel.  Decreased sensation in the median nerve distribution.  She has exquisite tenderness to palpation over the A1 pulley to the index finger, as well as some tenderness to palpation to the A1 pulley to the long finger.  Active triggering noted to the index finger.  No active triggering to the long finger.  She has pain in these areas when she makes a full fist.  IMAGING: I personally ordered and reviewed the following images:  EMG results for the right hand significant for severe right carpal tunnel syndrome.  [name], MD 10/22/2020 11:18 AM

## 2020-11-11 ENCOUNTER — Other Ambulatory Visit: Payer: Self-pay | Admitting: Physician Assistant

## 2020-11-12 ENCOUNTER — Other Ambulatory Visit: Payer: Self-pay | Admitting: Physician Assistant

## 2020-11-12 MED ORDER — GABAPENTIN 100 MG PO CAPS: 100.0000 mg | ORAL_CAPSULE | Freq: Two times a day (BID) | ORAL | 0 refills | Status: DC | PRN

## 2020-11-17 ENCOUNTER — Other Ambulatory Visit: Payer: Self-pay | Admitting: Physician Assistant

## 2020-11-17 DIAGNOSIS — E039 Hypothyroidism, unspecified: Secondary | ICD-10-CM

## 2020-11-17 DIAGNOSIS — N189 Chronic kidney disease, unspecified: Secondary | ICD-10-CM

## 2020-11-17 DIAGNOSIS — E118 Type 2 diabetes mellitus with unspecified complications: Secondary | ICD-10-CM

## 2020-11-17 DIAGNOSIS — E785 Hyperlipidemia, unspecified: Secondary | ICD-10-CM

## 2020-11-17 DIAGNOSIS — I10 Essential (primary) hypertension: Secondary | ICD-10-CM

## 2020-11-21 ENCOUNTER — Other Ambulatory Visit: Payer: Self-pay

## 2020-11-21 ENCOUNTER — Other Ambulatory Visit (HOSPITAL_COMMUNITY)
Admission: RE | Admit: 2020-11-21 | Discharge: 2020-11-21 | Disposition: A | Discharge status: Home / Self Care | Payer: Self-pay | Source: Ambulatory Visit | Attending: Physician Assistant | Admitting: Physician Assistant

## 2020-11-21 DIAGNOSIS — I10 Essential (primary) hypertension: Secondary | ICD-10-CM | POA: Insufficient documentation

## 2020-11-21 DIAGNOSIS — E118 Type 2 diabetes mellitus with unspecified complications: Secondary | ICD-10-CM | POA: Insufficient documentation

## 2020-11-21 DIAGNOSIS — N189 Chronic kidney disease, unspecified: Secondary | ICD-10-CM | POA: Insufficient documentation

## 2020-11-21 DIAGNOSIS — E039 Hypothyroidism, unspecified: Secondary | ICD-10-CM | POA: Insufficient documentation

## 2020-11-21 DIAGNOSIS — E785 Hyperlipidemia, unspecified: Secondary | ICD-10-CM | POA: Insufficient documentation

## 2020-11-21 LAB — COMPREHENSIVE METABOLIC PANEL
ALT: 25 U/L (ref 0–44)
AST: 24 U/L (ref 15–41)
Albumin: 3.8 g/dL (ref 3.5–5.0)
Alkaline Phosphatase: 63 U/L (ref 38–126)
Anion gap: 5 (ref 5–15)
BUN: 37 mg/dL — ABNORMAL HIGH (ref 6–20)
CO2: 23 mmol/L (ref 22–32)
Calcium: 9.2 mg/dL (ref 8.9–10.3)
Chloride: 113 mmol/L — ABNORMAL HIGH (ref 98–111)
Creatinine, Ser: 1.29 mg/dL — ABNORMAL HIGH (ref 0.44–1.00)
GFR, Estimated: 51 mL/min — ABNORMAL LOW (ref >60)
Glucose, Bld: 93 mg/dL (ref 70–99)
Potassium: 5.4 mmol/L — ABNORMAL HIGH (ref 3.5–5.1)
Sodium: 141 mmol/L (ref 135–145)
Total Bilirubin: 0.6 mg/dL (ref 0.3–1.2)
Total Protein: 7.2 g/dL (ref 6.5–8.1)

## 2020-11-21 LAB — LIPID PANEL
Cholesterol: 144 mg/dL (ref 0–200)
HDL: 39 mg/dL — ABNORMAL LOW (ref >40)
LDL Cholesterol: 86 mg/dL (ref 0–99)
Total CHOL/HDL Ratio: 3.7 RATIO
Triglycerides: 96 mg/dL (ref <150)
VLDL: 19 mg/dL (ref 0–40)

## 2020-11-21 LAB — HEMOGLOBIN A1C
Hgb A1c MFr Bld: 6.4 % — ABNORMAL HIGH (ref 4.8–5.6)
Mean Plasma Glucose: 136.98 mg/dL

## 2020-11-21 LAB — TSH: TSH: 3.906 u[IU]/mL (ref 0.350–4.500)

## 2020-11-26 ENCOUNTER — Encounter: Payer: Self-pay | Admitting: Physician Assistant

## 2020-11-26 ENCOUNTER — Other Ambulatory Visit (HOSPITAL_COMMUNITY)
Admission: RE | Admit: 2020-11-26 | Discharge: 2020-11-26 | Disposition: A | Discharge status: Home / Self Care | Payer: Self-pay | Source: Ambulatory Visit | Attending: Physician Assistant | Admitting: Physician Assistant

## 2020-11-26 ENCOUNTER — Other Ambulatory Visit: Payer: Self-pay

## 2020-11-26 ENCOUNTER — Ambulatory Visit: Payer: Self-pay | Admitting: Physician Assistant

## 2020-11-26 VITALS — BP 130/70 | HR 75 | Temp 97.9°F | Wt 177.0 lb

## 2020-11-26 DIAGNOSIS — I10 Essential (primary) hypertension: Secondary | ICD-10-CM

## 2020-11-26 DIAGNOSIS — N1831 Chronic kidney disease, stage 3a: Secondary | ICD-10-CM

## 2020-11-26 DIAGNOSIS — E118 Type 2 diabetes mellitus with unspecified complications: Secondary | ICD-10-CM

## 2020-11-26 DIAGNOSIS — E1141 Type 2 diabetes mellitus with diabetic mononeuropathy: Secondary | ICD-10-CM

## 2020-11-26 DIAGNOSIS — E875 Hyperkalemia: Secondary | ICD-10-CM

## 2020-11-26 DIAGNOSIS — Z1211 Encounter for screening for malignant neoplasm of colon: Secondary | ICD-10-CM

## 2020-11-26 DIAGNOSIS — E039 Hypothyroidism, unspecified: Secondary | ICD-10-CM

## 2020-11-26 DIAGNOSIS — K0889 Other specified disorders of teeth and supporting structures: Secondary | ICD-10-CM

## 2020-11-26 DIAGNOSIS — Z20822 Contact with and (suspected) exposure to covid-19: Secondary | ICD-10-CM

## 2020-11-26 LAB — POTASSIUM: Potassium: 4.5 mmol/L (ref 3.5–5.1)

## 2020-11-26 MED ORDER — AMOXICILLIN 500 MG PO CAPS: 500.0000 mg | ORAL_CAPSULE | Freq: Three times a day (TID) | ORAL | 0 refills | Status: AC

## 2020-11-26 NOTE — Progress Notes (Signed)
BP 130/70   Pulse 75   Temp 97.9 F (36.6 C)   Wt 177 lb (80.3 kg)   LMP 04/26/2016   SpO2 97%   BMI (P) 27.72 kg/m    Subjective:    Patient ID: Sandra Parsons, female    DOB: [birth year], 50 y.o.   MRN: [id number]  HPI: Sandra Parsons is a 50 y.o. female presenting on 11/26/2020 for Diabetes   HPI  Pt had a negative covid 19 screening questionnaire.  Pt is 49yoF with DM htn dyslipidemia and hypothyroidism.  Pt states teeth for 3 teeth infection for about 5 days.   She also has diarrhea that she thinks is related to swallowing the infection from her teeth    Pt reports mucus and congestion.  She has occasional cough or clearing of the throat.  She Had surgery right hand that is doing good.  She is scheduled for left hand surgery  She has no other complaints today and says she is otherwise well.     Relevant past medical, surgical, family and social history reviewed and updated as indicated. Interim medical history since our last visit reviewed. Allergies and medications reviewed and updated.   Current Outpatient Medications:    atorvastatin (LIPITOR) 20 MG tablet, TAKE 1 Tablet BY MOUTH ONCE EVERY DAY, Disp: 90 tablet, Rfl: 1   gabapentin (NEURONTIN) 100 MG capsule, Take 1 capsule (100 mg total) by mouth 2 (two) times daily as needed., Disp: 60 capsule, Rfl: 0   insulin glargine (LANTUS SOLOSTAR) 100 UNIT/ML Solostar Pen, Inject 25 Units into the skin daily., Disp: 15 mL, Rfl: 0   JANUMET 50-500 MG tablet, TAKE 1 Tablet  BY MOUTH TWICE DAILY WITH A MEAL, Disp: 180 tablet, Rfl: 1   levothyroxine (SYNTHROID) 50 MCG tablet, TAKE 1 Tablet BY MOUTH ONCE EVERY DAY, Disp: 90 tablet, Rfl: 1   Lidocaine 4 % PTCH, Apply 1 patch topically daily as needed (pain)., Disp: , Rfl:    lisinopril (ZESTRIL) 20 MG tablet, Take 1 tablet by mouth once daily, Disp: 30 tablet, Rfl: 3   naproxen sodium (ALEVE) 220 MG tablet, Take 440 mg by mouth 2 (two) times daily with a meal., Disp: , Rfl:     triamcinolone (KENALOG) 0.025 % ointment, Apply 1 application topically at bedtime as needed., Disp: 15 g, Rfl: 1     Review of Systems  Per HPI unless specifically indicated above     Objective:    BP 130/70   Pulse 75   Temp 97.9 F (36.6 C)   Wt 177 lb (80.3 kg)   LMP 04/26/2016   SpO2 97%   BMI (P) 27.72 kg/m   Wt Readings from Last 3 Encounters:  11/26/20 177 lb (80.3 kg)  08/28/20 (P) 175 lb (79.4 kg)  08/06/20 176 lb 4 oz (79.9 kg)    Physical Exam Vitals reviewed.  Constitutional:      General: She is not in acute distress.    Appearance: She is well-developed. She is not ill-appearing.  HENT:     Head: Normocephalic and atraumatic.  Cardiovascular:     Rate and Rhythm: Normal rate and regular rhythm.  Pulmonary:     Effort: Pulmonary effort is normal.     Breath sounds: Normal breath sounds.  Abdominal:     General: Bowel sounds are normal.     Palpations: Abdomen is soft. There is no mass.     Tenderness: There is no abdominal tenderness.  Musculoskeletal:  Cervical back: Neck supple.     Right lower leg: No edema.     Left lower leg: No edema.  Lymphadenopathy:     Cervical: No cervical adenopathy.  Skin:    General: Skin is warm and dry.  Neurological:     Mental Status: She is alert and oriented to person, place, and time.  Psychiatric:        Behavior: Behavior normal.    Results for orders placed or performed during the hospital encounter of 11/21/20  TSH  Result Value Ref Range   TSH 3.906 0.350 - 4.500 uIU/mL  Hemoglobin A1c  Result Value Ref Range   Hgb A1c MFr Bld 6.4 (H) 4.8 - 5.6 %   Mean Plasma Glucose 136.98 mg/dL  Lipid panel  Result Value Ref Range   Cholesterol 144 0 - 200 mg/dL   Triglycerides 96 <355 mg/dL   HDL 39 (L) >97 mg/dL   Total CHOL/HDL Ratio 3.7 RATIO   VLDL 19 0 - 40 mg/dL   LDL Cholesterol 86 0 - 99 mg/dL  Comprehensive metabolic panel  Result Value Ref Range   Sodium 141 135 - 145 mmol/L    Potassium 5.4 (H) 3.5 - 5.1 mmol/L   Chloride 113 (H) 98 - 111 mmol/L   CO2 23 22 - 32 mmol/L   Glucose, Bld 93 70 - 99 mg/dL   BUN 37 (H) 6 - 20 mg/dL   Creatinine, Ser 4.16 (H) 0.44 - 1.00 mg/dL   Calcium 9.2 8.9 - 38.4 mg/dL   Total Protein 7.2 6.5 - 8.1 g/dL   Albumin 3.8 3.5 - 5.0 g/dL   AST 24 15 - 41 U/L   ALT 25 0 - 44 U/L   Alkaline Phosphatase 63 38 - 126 U/L   Total Bilirubin 0.6 0.3 - 1.2 mg/dL   GFR, Estimated 51 (L) >60 mL/min   Anion gap 5 5 - 15      Assessment & Plan:   Encounter Diagnoses  Name Primary?   Controlled diabetes mellitus type 2 with complications, unspecified whether long term insulin use (HCC) Yes   Hypothyroidism, unspecified type    Essential hypertension    Stage 3a chronic kidney disease (HCC)    Dentalgia    Diabetic mononeuropathy associated with type 2 diabetes mellitus (HCC)    Suspected COVID-19 virus infection    Hyperkalemia    Screening for colon cancer       DM -reviewed labs with pt -will refer for annual DM eye exam -pt to continue janumet and lantus and diabetic diet  2. HTN Pt to continue lisinopril  3.  Dyslipidemia Pt to continue atorvastatin and lowfat diet  4. HCM -pt ws given FIT test for colon cancer screening  5.  dentalgia -pt is on Dental list.  Rx amoxil for the teeth  6.  Hyperkalemia -likely not high.  Will Recheck K+  7. Congestion/cough -Recomended pt take covid test in light of her congestion   8.  CKD -Refer to nephrologosit for ckd   -pt to follow up 3 months.  She is to contact office sooner prn

## 2020-12-02 ENCOUNTER — Other Ambulatory Visit: Payer: Self-pay | Admitting: Physician Assistant

## 2020-12-02 DIAGNOSIS — Z1211 Encounter for screening for malignant neoplasm of colon: Secondary | ICD-10-CM

## 2020-12-02 NOTE — Patient Instructions (Signed)
Sandra Parsons  12/02/2020     @   Your procedure is scheduled on  12/08/2020.   Report to [facility] at  [place] [facility]   Call this number if you have problems the morning of surgery:  [phone]   Remember:  Do not eat or drink after midnight.     Take 12.5 units of insulin the night before your procedure.      DO NOT take any medications for diabetes the morning of your procedure.      Take these medicines the morning of surgery with A SIP OF WATER                          gabapentin, levothyroxine.     Do not wear jewelry, make-up or nail polish.  Do not wear lotions, powders, or perfumes, or deodorant.  Do not shave 48 hours prior to surgery.  Men may shave face and neck.  Do not bring valuables to the hospital.  [facility] is not responsible for any belongings or valuables.  Contacts, dentures or bridgework may not be worn into surgery.  Leave your suitcase in the car.  After surgery it may be brought to your room.  For patients admitted to the hospital, discharge time will be determined by your treatment team.  Patients discharged the day of surgery will not be allowed to drive home and must have someone with them for 24 hours.    Special instructions:   DO NOT smoke tobacco or vape for 24 hours before your procedure.  Please read over the following fact sheets that you were given. Coughing and Deep Breathing, Surgical Site Infection Prevention, Anesthesia Post-op Instructions, and Care and Recovery After Surgery       Open Carpal Tunnel Release Open carpal tunnel release is a surgery to relieve symptoms caused by carpal tunnel syndrome. The carpal tunnel is a narrow, rigid space in the wrist. It is located between the wrist bones and a band of connective tissue (transverse carpal ligament). The nerve that supplies most of the hand (median nerve) passes through the carpal tunnel, and so do tissues that connect bones to muscles  (tendons) in the hand and arm. Carpal tunnel syndrome makes this space swell and become narrow. The swelling pinches the median nerve and causes pain, numbness, tingling, and weakness in the hand. During carpal tunnel release surgery, the transverse carpal ligament is cut to make more room in the carpal tunnel space. This also lessens the pressure on the median nerve. You may have this surgery if other types of treatment have not relieved your carpal tunnel symptoms. This surgery is usually done only for the hand that you use more often (dominant hand), but it may be done for both hands depending on your symptoms. Tell a health care provider about: Any allergies you have. All medicines you are taking, including vitamins, herbs, eye drops, creams, and over-the-counter medicines. Any problems you or family members have had with anesthetic medicines. Any blood disorders you have. Any surgeries you have had. Any medical conditions you have. Whether you are pregnant or may be pregnant. What are the risks? Generally, this is a safe procedure. However, problems may occur, including: Infection. Bleeding. Injury to the median nerve. Allergic reactions to medicines. The surgery failing to relieve your symptoms, or making your symptoms worse.   Open Carpal Tunnel Release, Care After This sheet gives you  information about how to care for yourself after your procedure. Your health care provider may also give you more specific instructions. If you have problems or questions, contact your health care provider. What can I expect after the procedure? After the procedure, it is common to have: Pain. Swelling. Wrist stiffness. Bruising. Follow these instructions at home: Medicines Take over-the-counter and prescription medicines only as told by your health care provider. Ask your health care provider if the medicine prescribed to you: Requires you to avoid driving or using machinery. Can cause  constipation. You may need to take these actions to prevent or treat constipation: Drink enough fluid to keep your urine pale yellow. Take over-the-counter or prescription medicines. Eat foods that are high in fiber, such as beans, whole grains, and fresh fruits and vegetables. Limit foods that are high in fat and processed sugars, such as fried or sweet foods. Bathing Do not take baths, swim, or use a hot tub until your health care provider approves. Ask your health care provider if you may take showers. Keep your bandage (dressing) dry until your health care provider says it can be removed. Cover it with a watertight covering when you take a bath or a shower. If you have a splint or brace: Wear the splint or brace as told by your health care provider. You may need to wear it for 2-3 weeks. Remove it only as told by your health care provider. Loosen the splint or brace if your fingers tingle, become numb, or turn cold and blue. Keep the splint or brace clean. If the splint or brace is not waterproof: Do not let it get wet. Cover it with a watertight covering when you take a bath or a shower. Incision care  After the compression bandage has been removed, follow instructions from your health care provider about how to take care of your incision. Make sure you: Wash your hands with soap and water for at least 20 seconds before and after you change your bandage (dressing). If soap and water are not available, use hand sanitizer. Change your dressing as told by your health care provider. Leave stitches (sutures), skin glue, or adhesive strips in place. These skin closures may need to stay in place for 2 weeks or longer. If adhesive strip edges start to loosen and curl up, you may trim the loose edges. Do not remove adhesive strips completely unless your health care provider tells you to do that. Check your incision area every day for signs of infection. Check for: Redness. More swelling or  pain. Fluid or blood. Warmth. Pus or a bad smell. Managing pain, stiffness, and swelling  If directed, put ice on the affected area. If you have a removable splint or brace, remove it as told by your health care provider. Put ice in a plastic bag. Place a towel between your skin and the bag. Leave the ice on for 20 minutes, 2-3 times a day. Do not fall asleep with ice pack on your skin. Remove the ice if your skin turns bright red. This is very important. If you cannot feel pain, heat, or cold, you have a greater risk of damage to the area. Move your fingers often to avoid stiffness and to lessen swelling. Raise (elevate) your wrist above the level of your heart while you are sitting or lying down. Activity Do not drive until your health care provider approves. Use your hand carefully. Do not do activities that cause pain. You should be able to  do light activities with your hand. Do not lift with your affected hand until your health care provider approves. Avoid pulling and pushing with the injured arm. Return to your normal activities as told by your health care provider. Ask your health care provider what activities are safe for you. If physical therapy was prescribed, do exercises as told by your therapist. Physical therapy can help you heal faster and regain movement. General instructions Do not use any products that contain nicotine or tobacco, such as cigarettes and e-cigarettes. These can delay incision healing after surgery. If you need help quitting, ask your health care provider. Keep all follow-up visits. This is important. These include visits for physical therapy. Contact a health care provider if: You have redness around your incision. You have more swelling or pain. You have fluid or blood coming from your incision. Your incision feels warm to the touch. You have pus or a bad smell coming from your incision. You have a fever or chills. You have pain that does not get  better with medicine. Your carpal tunnel symptoms do not go away after 2 months. Your carpal tunnel symptoms go away and then come back. Get help right away if: You have pain or numbness that is getting worse. Your fingers or fingertips become very pale or bluish in color. You are not able to move your fingers. Summary It is common to have wrist stiffness and bruising after a carpal tunnel release. Icing and raising (elevating) your wrist may help to lessen swelling and pain. Call your health care provider if you have a fever or notice any signs of infection in your incision area. This information is not intended to replace advice given to you by your health care provider. Make sure you discuss any questions you have with your health care provider. Document Revised: 07/12/2019 Document Reviewed: 07/12/2019 Elsevier Patient Education  2022 [employer]    Medicines Ask your health care provider about: Changing or stopping your regular medicines. This is especially important if you are taking diabetes medicines or blood thinners. Taking medicines such as aspirin and ibuprofen. These medicines can thin your blood. Do not take these medicines unless your health care provider tells you to take them. Taking over-the-counter medicines, vitamins, herbs, and supplements. General instructions Do not use any products that contain nicotine or tobacco for at least 4 weeks before the procedure. These products include cigarettes, e-cigarettes, and chewing tobacco. If you need help quitting, ask your health care provider. Plan to have a responsible adult take you home from the hospital or clinic. If you will be going home right after the procedure, plan to have a responsible adult care for you for the time you are told. This is important. Ask your health care provider: How your surgery site will be marked. What steps will be taken to help prevent infection. These steps may include washing the skin with  a germ-killing soap. What happens during the procedure?  An IV will be inserted into one of your veins. You will be given one or more of the following: A medicine to help you relax (sedative). A medicine to numb the wrist area (local anesthetic). A medicine that is injected to numb everything below the injection site (regional anesthetic). An incision will be made in your wrist, on the same side as your palm. The skin of your wrist will be spread to expose the transverse carpal ligament. The transverse carpal ligament will be cut to make more room in the  carpal tunnel space. Your incision will be closed with stitches (sutures) or staples. A compression bandage (dressing) will be wrapped around your hand and wrist. The procedure may vary among health care providers and hospitals. What happens after the procedure? Your blood pressure, heart rate, breathing rate, and blood oxygen level will be monitored until you leave the hospital or clinic. You will be given pain medicine as needed. A splint or brace may be placed over your dressing, to hold your hand and wrist in place while you heal. Do not drive until your health care provider approves. Summary Carpal tunnel release is a surgery to relieve pain and numbness in the hand caused by swelling around a nerve (carpal tunnel syndrome). You may have this surgery if other types of treatment have not relieved your carpal tunnel symptoms. During carpal tunnel release surgery, a band of connective tissue (transverse carpal ligament) is cut to make more room in the carpal tunnel space. This information is not intended to replace advice given to you by your health care provider. Make sure you discuss any questions you have with your health care provider. Document Revised: 07/24/2019 Document Reviewed: 07/12/2019 Elsevier Patient Education  2022 [employer] General Anesthesia, Adult, Care After This sheet gives you information about how to care for  yourself after your procedure. Your health care provider may also give you more specific instructions. If you have problems or questions, contact your health care provider. What can I expect after the procedure? After the procedure, the following side effects are common: Pain or discomfort at the IV site. Nausea. Vomiting. Sore throat. Trouble concentrating. Feeling cold or chills. Feeling weak or tired. Sleepiness and fatigue. Soreness and body aches. These side effects can affect parts of the body that were not involved in surgery. Follow these instructions at home: For the time period you were told by your health care provider:  Rest. Do not participate in activities where you could fall or become injured. Do not drive or use machinery. Do not drink alcohol. Do not take sleeping pills or medicines that cause drowsiness. Do not make important decisions or sign legal documents. Do not take care of children on your own. Eating and drinking Follow any instructions from your health care provider about eating or drinking restrictions. When you feel hungry, start by eating small amounts of foods that are soft and easy to digest (bland), such as toast. Gradually return to your regular diet. Drink enough fluid to keep your urine pale yellow. If you vomit, rehydrate by drinking water, juice, or clear broth. General instructions If you have sleep apnea, surgery and certain medicines can increase your risk for breathing problems. Follow instructions from your health care provider about wearing your sleep device: Anytime you are sleeping, including during daytime naps. While taking prescription pain medicines, sleeping medicines, or medicines that make you drowsy. Have a responsible adult stay with you for the time you are told. It is important to have someone help care for you until you are awake and alert. Return to your normal activities as told by your health care provider. Ask your health  care provider what activities are safe for you. Take over-the-counter and prescription medicines only as told by your health care provider. If you smoke, do not smoke without supervision. Keep all follow-up visits as told by your health care provider. This is important. Contact a health care provider if: You have nausea or vomiting that does not get better with medicine. You cannot eat  or drink without vomiting. You have pain that does not get better with medicine. You are unable to pass urine. You develop a skin rash. You have a fever. You have redness around your IV site that gets worse. Get help right away if: You have difficulty breathing. You have chest pain. You have blood in your urine or stool, or you vomit blood. Summary After the procedure, it is common to have a sore throat or nausea. It is also common to feel tired. Have a responsible adult stay with you for the time you are told. It is important to have someone help care for you until you are awake and alert. When you feel hungry, start by eating small amounts of foods that are soft and easy to digest (bland), such as toast. Gradually return to your regular diet. Drink enough fluid to keep your urine pale yellow. Return to your normal activities as told by your health care provider. Ask your health care provider what activities are safe for you. This information is not intended to replace advice given to you by your health care provider. Make sure you discuss any questions you have with your health care provider. Document Revised: 11/22/2019 Document Reviewed: 06/21/2019 Elsevier Patient Education  2022 [employer] How to Use Chlorhexidine for Bathing Chlorhexidine gluconate (CHG) is a germ-killing (antiseptic) solution that is used to clean the skin. It can get rid of the bacteria that normally live on the skin and can keep them away for about 24 hours. To clean your skin with CHG, you may be given: A CHG solution to use  in the shower or as part of a sponge bath. A prepackaged cloth that contains CHG. Cleaning your skin with CHG may help lower the risk for infection: While you are staying in the intensive care unit of the hospital. If you have a vascular access, such as a central line, to provide short-term or long-term access to your veins. If you have a catheter to drain urine from your bladder. If you are on a ventilator. A ventilator is a machine that helps you breathe by moving air in and out of your lungs. After surgery. What are the risks? Risks of using CHG include: A skin reaction. Hearing loss, if CHG gets in your ears and you have a perforated eardrum. Eye injury, if CHG gets in your eyes and is not rinsed out. The CHG product catching fire. Make sure that you avoid smoking and flames after applying CHG to your skin. Do not use CHG: If you have a chlorhexidine allergy or have previously reacted to chlorhexidine. On babies younger than 50 months of age. How to use CHG solution Use CHG only as told by your health care provider, and follow the instructions on the label. Use the full amount of CHG as directed. Usually, this is one bottle. During a shower Follow these steps when using CHG solution during a shower (unless your health care provider gives you different instructions): Start the shower. Use your normal soap and shampoo to wash your face and hair. Turn off the shower or move out of the shower stream. Pour the CHG onto a clean washcloth. Do not use any type of brush or rough-edged sponge. Starting at your neck, lather your body down to your toes. Make sure you follow these instructions: If you will be having surgery, pay special attention to the part of your body where you will be having surgery. Scrub this area for at least 1 minute.  Do not use CHG on your head or face. If the solution gets into your ears or eyes, rinse them well with water. Avoid your genital area. Avoid any areas of  skin that have broken skin, cuts, or scrapes. Scrub your back and under your arms. Make sure to wash skin folds. Let the lather sit on your skin for 1-2 minutes or as long as told by your health care provider. Thoroughly rinse your entire body in the shower. Make sure that all body creases and crevices are rinsed well. Dry off with a clean towel. Do not put any substances on your body afterward--such as powder, lotion, or perfume--unless you are told to do so by your health care provider. Only use lotions that are recommended by the manufacturer. Put on clean clothes or pajamas. If it is the night before your surgery, sleep in clean sheets.  During a sponge bath Follow these steps when using CHG solution during a sponge bath (unless your health care provider gives you different instructions): Use your normal soap and shampoo to wash your face and hair. Pour the CHG onto a clean washcloth. Starting at your neck, lather your body down to your toes. Make sure you follow these instructions: If you will be having surgery, pay special attention to the part of your body where you will be having surgery. Scrub this area for at least 1 minute. Do not use CHG on your head or face. If the solution gets into your ears or eyes, rinse them well with water. Avoid your genital area. Avoid any areas of skin that have broken skin, cuts, or scrapes. Scrub your back and under your arms. Make sure to wash skin folds. Let the lather sit on your skin for 1-2 minutes or as long as told by your health care provider. Using a different clean, wet washcloth, thoroughly rinse your entire body. Make sure that all body creases and crevices are rinsed well. Dry off with a clean towel. Do not put any substances on your body afterward--such as powder, lotion, or perfume--unless you are told to do so by your health care provider. Only use lotions that are recommended by the manufacturer. Put on clean clothes or pajamas. If it is  the night before your surgery, sleep in clean sheets. How to use CHG prepackaged cloths Only use CHG cloths as told by your health care provider, and follow the instructions on the label. Use the CHG cloth on clean, dry skin. Do not use the CHG cloth on your head or face unless your health care provider tells you to. When washing with the CHG cloth: Avoid your genital area. Avoid any areas of skin that have broken skin, cuts, or scrapes. Before surgery Follow these steps when using a CHG cloth to clean before surgery (unless your health care provider gives you different instructions): Using the CHG cloth, vigorously scrub the part of your body where you will be having surgery. Scrub using a back-and-forth motion for 3 minutes. The area on your body should be completely wet with CHG when you are done scrubbing. Do not rinse. Discard the cloth and let the area air-dry. Do not put any substances on the area afterward, such as powder, lotion, or perfume. Put on clean clothes or pajamas. If it is the night before your surgery, sleep in clean sheets.  For general bathing Follow these steps when using CHG cloths for general bathing (unless your health care provider gives you different instructions). Use a separate  CHG cloth for each area of your body. Make sure you wash between any folds of skin and between your fingers and toes. Wash your body in the following order, switching to a new cloth after each step: The front of your neck, shoulders, and chest. Both of your arms, under your arms, and your hands. Your stomach and groin area, avoiding the genitals. Your right leg and foot. Your left leg and foot. The back of your neck, your back, and your buttocks. Do not rinse. Discard the cloth and let the area air-dry. Do not put any substances on your body afterward--such as powder, lotion, or perfume--unless you are told to do so by your health care provider. Only use lotions that are recommended by the  manufacturer. Put on clean clothes or pajamas. Contact a health care provider if: Your skin gets irritated after scrubbing. You have questions about using your solution or cloth. You swallow any chlorhexidine. Call your local poison control center [phone] 095-1599 in the [place] Get help right away if: Your eyes itch badly, or they become very red or swollen. Your skin itches badly and is red or swollen. Your hearing changes. You have trouble seeing. You have swelling or tingling in your mouth or throat. You have trouble breathing. These symptoms may represent a serious problem that is an emergency. Do not wait to see if the symptoms will go away. Get medical help right away. Call your local emergency services (911 in the [place] Do not drive yourself to the hospital. Summary Chlorhexidine gluconate (CHG) is a germ-killing (antiseptic) solution that is used to clean the skin. Cleaning your skin with CHG may help to lower your risk for infection. You may be given CHG to use for bathing. It may be in a bottle or in a prepackaged cloth to use on your skin. Carefully follow your health care provider's instructions and the instructions on the product label. Do not use CHG if you have a chlorhexidine allergy. Contact your health care provider if your skin gets irritated after scrubbing. This information is not intended to replace advice given to you by your health care provider. Make sure you discuss any questions you have with your health care provider. Document Revised: 05/19/2020 Document Reviewed: 05/19/2020 Elsevier Patient Education  2022 [employer].

## 2020-12-03 ENCOUNTER — Telehealth: Payer: Self-pay

## 2020-12-03 NOTE — Telephone Encounter (Signed)
Attempted to call pt to check the status of her [facility] and to see if she would like to proceed with her surgery on 9/19 with Dr. Dallas Schimke. No answer and unable to leave a VM.

## 2020-12-04 ENCOUNTER — Other Ambulatory Visit: Payer: Self-pay

## 2020-12-04 ENCOUNTER — Encounter (HOSPITAL_COMMUNITY)
Admission: RE | Admit: 2020-12-04 | Discharge: 2020-12-04 | Disposition: A | Discharge status: Home / Self Care | Payer: Self-pay | Source: Ambulatory Visit | Attending: Orthopedic Surgery | Admitting: Orthopedic Surgery

## 2020-12-04 DIAGNOSIS — Z01812 Encounter for preprocedural laboratory examination: Secondary | ICD-10-CM | POA: Insufficient documentation

## 2020-12-04 LAB — CBC
HCT: 28.3 % — ABNORMAL LOW (ref 36.0–46.0)
Hemoglobin: 9.3 g/dL — ABNORMAL LOW (ref 12.0–15.0)
MCH: 32.2 pg (ref 26.0–34.0)
MCHC: 32.9 g/dL (ref 30.0–36.0)
MCV: 97.9 fL (ref 80.0–100.0)
Platelets: 229 10*3/uL (ref 150–400)
RBC: 2.89 MIL/uL — ABNORMAL LOW (ref 3.87–5.11)
RDW: 12.6 % (ref 11.5–15.5)
WBC: 9.1 10*3/uL (ref 4.0–10.5)
nRBC: 0 % (ref 0.0–0.2)

## 2020-12-04 LAB — BASIC METABOLIC PANEL
Anion gap: 6 (ref 5–15)
BUN: 34 mg/dL — ABNORMAL HIGH (ref 6–20)
CO2: 19 mmol/L — ABNORMAL LOW (ref 22–32)
Calcium: 9.1 mg/dL (ref 8.9–10.3)
Chloride: 114 mmol/L — ABNORMAL HIGH (ref 98–111)
Creatinine, Ser: 1.28 mg/dL — ABNORMAL HIGH (ref 0.44–1.00)
GFR, Estimated: 52 mL/min — ABNORMAL LOW (ref >60)
Glucose, Bld: 146 mg/dL — ABNORMAL HIGH (ref 70–99)
Potassium: 4.5 mmol/L (ref 3.5–5.1)
Sodium: 139 mmol/L (ref 135–145)

## 2020-12-04 LAB — HCG, SERUM, QUALITATIVE: Preg, Serum: NEGATIVE

## 2020-12-08 ENCOUNTER — Other Ambulatory Visit: Payer: Self-pay

## 2020-12-08 ENCOUNTER — Encounter (HOSPITAL_COMMUNITY): Payer: Self-pay | Admitting: Orthopedic Surgery

## 2020-12-08 ENCOUNTER — Ambulatory Visit (HOSPITAL_COMMUNITY)
Admission: RE | Admit: 2020-12-08 | Discharge: 2020-12-08 | Disposition: A | Discharge status: Home / Self Care | Payer: Self-pay | Attending: Orthopedic Surgery | Admitting: Orthopedic Surgery

## 2020-12-08 ENCOUNTER — Ambulatory Visit (HOSPITAL_COMMUNITY): Payer: Self-pay | Admitting: Certified Registered"

## 2020-12-08 ENCOUNTER — Encounter (HOSPITAL_COMMUNITY)
Admission: RE | Disposition: A | Discharge status: Home / Self Care | Payer: Self-pay | Source: Home / Self Care | Attending: Orthopedic Surgery

## 2020-12-08 DIAGNOSIS — G5601 Carpal tunnel syndrome, right upper limb: Secondary | ICD-10-CM | POA: Insufficient documentation

## 2020-12-08 DIAGNOSIS — M65331 Trigger finger, right middle finger: Secondary | ICD-10-CM | POA: Insufficient documentation

## 2020-12-08 DIAGNOSIS — Z87891 Personal history of nicotine dependence: Secondary | ICD-10-CM | POA: Insufficient documentation

## 2020-12-08 DIAGNOSIS — M65321 Trigger finger, right index finger: Secondary | ICD-10-CM

## 2020-12-08 HISTORY — PX: CARPAL TUNNEL RELEASE: SHX101

## 2020-12-08 HISTORY — PX: TRIGGER FINGER RELEASE: SHX641

## 2020-12-08 LAB — GLUCOSE, CAPILLARY
Glucose-Capillary: 112 mg/dL — ABNORMAL HIGH (ref 70–99)
Glucose-Capillary: 129 mg/dL — ABNORMAL HIGH (ref 70–99)

## 2020-12-08 SURGERY — CARPAL TUNNEL RELEASE
Anesthesia: General | Site: Hand | Laterality: Right

## 2020-12-08 MED ORDER — FENTANYL CITRATE (PF) 100 MCG/2ML IJ SOLN
INTRAMUSCULAR | Status: AC
  Filled 2020-12-08: qty 2

## 2020-12-08 MED ORDER — BUPIVACAINE HCL (PF) 0.5 % IJ SOLN
INTRAMUSCULAR | Status: AC
  Filled 2020-12-08: qty 30

## 2020-12-08 MED ORDER — LACTATED RINGERS IV SOLN
INTRAVENOUS | Status: DC
  Administered 2020-12-08: 1000 mL via INTRAVENOUS

## 2020-12-08 MED ORDER — ONDANSETRON HCL 4 MG/2ML IJ SOLN
INTRAMUSCULAR | Status: DC | PRN
  Administered 2020-12-08: 4 mg via INTRAVENOUS

## 2020-12-08 MED ORDER — CHLORHEXIDINE GLUCONATE 0.12 % MT SOLN
15.0000 mL | Freq: Once | OROMUCOSAL | Status: AC
  Administered 2020-12-08: 15 mL via OROMUCOSAL

## 2020-12-08 MED ORDER — 0.9 % SODIUM CHLORIDE (POUR BTL) OPTIME
TOPICAL | Status: DC | PRN
  Administered 2020-12-08: 1000 mL

## 2020-12-08 MED ORDER — FENTANYL CITRATE PF 50 MCG/ML IJ SOSY: 25.0000 ug | PREFILLED_SYRINGE | INTRAMUSCULAR | Status: DC | PRN

## 2020-12-08 MED ORDER — ONDANSETRON HCL 4 MG/2ML IJ SOLN
INTRAMUSCULAR | Status: AC
  Filled 2020-12-08: qty 2

## 2020-12-08 MED ORDER — PROPOFOL 10 MG/ML IV BOLUS
INTRAVENOUS | Status: AC
  Filled 2020-12-08: qty 40

## 2020-12-08 MED ORDER — BUPIVACAINE HCL (PF) 0.5 % IJ SOLN
INTRAMUSCULAR | Status: DC | PRN
  Administered 2020-12-08: 10 mL

## 2020-12-08 MED ORDER — MIDAZOLAM HCL 2 MG/2ML IJ SOLN
INTRAMUSCULAR | Status: AC
  Filled 2020-12-08: qty 2

## 2020-12-08 MED ORDER — HYDROCODONE-ACETAMINOPHEN 5-325 MG PO TABS: 1.0000 | ORAL_TABLET | Freq: Four times a day (QID) | ORAL | 0 refills | Status: AC | PRN

## 2020-12-08 MED ORDER — DIPHENHYDRAMINE HCL 50 MG/ML IJ SOLN
INTRAMUSCULAR | Status: DC | PRN
  Administered 2020-12-08: 12.5 mg via INTRAVENOUS

## 2020-12-08 MED ORDER — FENTANYL CITRATE (PF) 250 MCG/5ML IJ SOLN
INTRAMUSCULAR | Status: DC | PRN
  Administered 2020-12-08: 25 ug via INTRAVENOUS
  Administered 2020-12-08: 50 ug via INTRAVENOUS
  Administered 2020-12-08 (×5): 25 ug via INTRAVENOUS

## 2020-12-08 MED ORDER — ONDANSETRON HCL 4 MG PO TABS: 4.0000 mg | ORAL_TABLET | Freq: Three times a day (TID) | ORAL | 0 refills | Status: DC | PRN

## 2020-12-08 MED ORDER — PROPOFOL 10 MG/ML IV BOLUS
INTRAVENOUS | Status: DC | PRN
  Administered 2020-12-08: 200 mg via INTRAVENOUS

## 2020-12-08 MED ORDER — CEFAZOLIN SODIUM-DEXTROSE 2-4 GM/100ML-% IV SOLN
2.0000 g | INTRAVENOUS | Status: AC
  Administered 2020-12-08: 2 g via INTRAVENOUS
  Filled 2020-12-08: qty 100

## 2020-12-08 MED ORDER — LIDOCAINE HCL (CARDIAC) PF 100 MG/5ML IV SOSY
PREFILLED_SYRINGE | INTRAVENOUS | Status: DC | PRN
  Administered 2020-12-08: 60 mg via INTRAVENOUS

## 2020-12-08 MED ORDER — ORAL CARE MOUTH RINSE: 15.0000 mL | Freq: Once | OROMUCOSAL | Status: AC

## 2020-12-08 MED ORDER — ONDANSETRON HCL 4 MG/2ML IJ SOLN: 4.0000 mg | Freq: Once | INTRAMUSCULAR | Status: DC | PRN

## 2020-12-08 MED ORDER — DIPHENHYDRAMINE HCL 50 MG/ML IJ SOLN
INTRAMUSCULAR | Status: AC
  Filled 2020-12-08: qty 1

## 2020-12-08 MED ORDER — MIDAZOLAM HCL 2 MG/2ML IJ SOLN
INTRAMUSCULAR | Status: DC | PRN
  Administered 2020-12-08: 2 mg via INTRAVENOUS

## 2020-12-08 SURGICAL SUPPLY — 53 items
ADH SKN CLS APL DERMABOND .7 (GAUZE/BANDAGES/DRESSINGS)
APL PRP STRL LF DISP 70% ISPRP (MISCELLANEOUS) ×1
BANDAGE CONFORM 2  STR LF (GAUZE/BANDAGES/DRESSINGS) IMPLANT
BANDAGE ESMARK 4X12 BL STRL LF (DISPOSABLE) ×1 IMPLANT
BLADE SURG 15 STRL LF DISP TIS (BLADE) ×1 IMPLANT
BLADE SURG 15 STRL SS (BLADE) ×2
BNDG CMPR 12X4 ELC STRL LF (DISPOSABLE) ×1
BNDG CMPR STD VLCR NS LF 5.8X2 (GAUZE/BANDAGES/DRESSINGS)
BNDG CMPR STD VLCR NS LF 5.8X3 (GAUZE/BANDAGES/DRESSINGS) ×1
BNDG COHESIVE 3X5 TAN STRL LF (GAUZE/BANDAGES/DRESSINGS) IMPLANT
BNDG COHESIVE 4X5 TAN STRL (GAUZE/BANDAGES/DRESSINGS) ×2 IMPLANT
BNDG CONFORM 2 STRL LF (GAUZE/BANDAGES/DRESSINGS) IMPLANT
BNDG ELASTIC 2X5.8 VLCR NS LF (GAUZE/BANDAGES/DRESSINGS) IMPLANT
BNDG ELASTIC 3X5.8 VLCR NS LF (GAUZE/BANDAGES/DRESSINGS) ×2 IMPLANT
BNDG ELASTIC 3X5.8 VLCR STR LF (GAUZE/BANDAGES/DRESSINGS) IMPLANT
BNDG ESMARK 4X12 BLUE STRL LF (DISPOSABLE) ×2
BNDG GAUZE ELAST 4 BULKY (GAUZE/BANDAGES/DRESSINGS) IMPLANT
CHLORAPREP W/TINT 26 (MISCELLANEOUS) ×2 IMPLANT
CLOTH BEACON ORANGE TIMEOUT ST (SAFETY) ×2 IMPLANT
COVER LIGHT HANDLE STERIS (MISCELLANEOUS) ×4 IMPLANT
CUFF TOURN SGL QUICK 18X4 (TOURNIQUET CUFF) ×2 IMPLANT
DECANTER SPIKE VIAL GLASS SM (MISCELLANEOUS) IMPLANT
DERMABOND ADVANCED (GAUZE/BANDAGES/DRESSINGS)
DERMABOND ADVANCED .7 DNX12 (GAUZE/BANDAGES/DRESSINGS) IMPLANT
DRAPE HALF SHEET 40X57 (DRAPES) ×2 IMPLANT
DRAPE SURG 17X23 STRL (DRAPES) ×2 IMPLANT
DRSG TEGADERM 4X4.75 (GAUZE/BANDAGES/DRESSINGS) IMPLANT
ELECT NEEDLE TIP 2.8 STRL (NEEDLE) IMPLANT
ELECT REM PT RETURN 9FT ADLT (ELECTROSURGICAL) ×2
ELECTRODE REM PT RTRN 9FT ADLT (ELECTROSURGICAL) ×1 IMPLANT
GAUZE KERLIX 2X3 DERM STRL LF (GAUZE/BANDAGES/DRESSINGS) IMPLANT
GAUZE SPONGE 4X4 12PLY STRL (GAUZE/BANDAGES/DRESSINGS) ×2 IMPLANT
GAUZE XEROFORM 1X8 LF (GAUZE/BANDAGES/DRESSINGS) ×2 IMPLANT
GLOVE SRG 8 PF TXTR STRL LF DI (GLOVE) ×1 IMPLANT
GLOVE SURG POLYISO LF SZ8 (GLOVE) ×6 IMPLANT
GLOVE SURG UNDER POLY LF SZ7 (GLOVE) ×4 IMPLANT
GLOVE SURG UNDER POLY LF SZ8 (GLOVE) ×2
GOWN STRL REUS W/ TWL XL LVL3 (GOWN DISPOSABLE) ×1 IMPLANT
GOWN STRL REUS W/TWL LRG LVL3 (GOWN DISPOSABLE) ×2 IMPLANT
GOWN STRL REUS W/TWL XL LVL3 (GOWN DISPOSABLE) ×2
KIT TURNOVER KIT A (KITS) ×2 IMPLANT
MANIFOLD NEPTUNE II (INSTRUMENTS) ×2 IMPLANT
NEEDLE HYPO 21X1.5 SAFETY (NEEDLE) ×2 IMPLANT
NS IRRIG 1000ML POUR BTL (IV SOLUTION) ×2 IMPLANT
PACK BASIC LIMB (CUSTOM PROCEDURE TRAY) ×2 IMPLANT
PAD ARMBOARD 7.5X6 YLW CONV (MISCELLANEOUS) ×2 IMPLANT
POSITIONER HAND ALUMI XLG (MISCELLANEOUS) ×2 IMPLANT
SET BASIN LINEN APH (SET/KITS/TRAYS/PACK) ×2 IMPLANT
SPONGE GAUZE 2X2 8PLY STRL LF (GAUZE/BANDAGES/DRESSINGS) IMPLANT
SUT ETHILON 3 0 FSL (SUTURE) IMPLANT
SUT MON AB 3-0 SH 27 (SUTURE) IMPLANT
SUT PROLENE NAB BLUE 3-0 30IN (SUTURE) ×2 IMPLANT
SYR CONTROL 10ML LL (SYRINGE) ×2 IMPLANT

## 2020-12-08 NOTE — Interval H&P Note (Signed)
History and Physical Interval Note:  12/08/2020 7:13 AM  Sandra Parsons  has presented today for surgery, with the diagnosis of Right carpal tunnel release; right index and long finger trigger release.  The various methods of treatment have been discussed with the patient and family. After consideration of risks, benefits and other options for treatment, the patient has consented to  Procedure(s) with comments: CARPAL TUNNEL RELEASE (Right) RELEASE TRIGGER FINGER/A-1 PULLEY (Right) - Right index and long finger trigger release as a surgical intervention.  The patient's history has been reviewed, patient examined, no change in status, stable for surgery.  I have reviewed the patient's chart and labs.  Questions were answered to the patient's satisfaction.    Patient has dense numbness in her right hand, median nerve distribution.  She has recently sustained some blisters on her fingers due to the numbness.  She also has some atrophy of the thenar musculature.  EMG demonstrates severe carpal tunnel.  She is aware that surgery may not alleviate all of her symptoms, but should prevent worsening of the atrophy.  She also has stiffness and locking of the right index and long fingers.  She is ready to proceed with surgery.   All questions answered.    [name]

## 2020-12-08 NOTE — Discharge Instructions (Signed)
  Jedaiah Rathbun A. Guadalupe Kerekes, MD MS [facility] [address] [place],  [place]  [address] Phone: [phone] Fax: [phone]    POST-OPERATIVE INSTRUCTIONS   WOUND CARE You may remove your bandage on postop day 3 and get the hand wet.  No ointments or lotions to be applied to the incision.  Do not submerge the incision for 1 month.  FOLLOW-UP If you develop a Fever (>101.5), Redness or Drainage from the surgical incision site, please call our office to arrange for an evaluation. Please call the office to schedule a follow-up appointment for your incision check if you do not already have one, 7-10 days post-operatively.  IF YOU HAVE ANY QUESTIONS, PLEASE FEEL FREE TO CALL OUR OFFICE.  HELPFUL INFORMATION  You should wean off your narcotic medicines as soon as you are able.  Most patients will be off or using minimal narcotics before their first postop appointment.   You may be more comfortable sleeping in a semi-seated position the first few nights following surgery.  Keep a pillow propped under the elbow and forearm for comfort.  If you have a recliner type of chair it might be beneficial.    We suggest you use the pain medication the first night prior to going to bed, in order to ease any pain when the anesthesia wears off. You should avoid taking pain medications on an empty stomach as it will make you nauseous.  Do not drink alcoholic beverages or take illicit drugs when taking pain medications.  You may return to work/school in the next couple of days when you feel up to it. Desk work and typing is fine.  Pain medication may make you constipated.  Below are a few solutions to try in this order: Decrease the amount of pain medication if you aren't having pain. Drink lots of decaffeinated fluids. Drink prune juice and/or each dried prunes  If the first 3 don't work start with additional solutions Take Colace - an over-the-counter stool softener Take  Senokot - an over-the-counter laxative Take Miralax - a stronger over-the-counter laxative    

## 2020-12-08 NOTE — Anesthesia Procedure Notes (Signed)
Procedure Name: LMA Insertion Date/Time: 12/08/2020 7:33 AM Performed by: Lorin Glass, CRNA Pre-anesthesia Checklist: Patient identified, Emergency Drugs available, Suction available and Patient being monitored Patient Re-evaluated:Patient Re-evaluated prior to induction Oxygen Delivery Method: Circle system utilized Preoxygenation: Pre-oxygenation with 100% oxygen Induction Type: IV induction Ventilation: Mask ventilation without difficulty LMA: LMA inserted LMA Size: 4.0 Number of attempts: 1 Placement Confirmation: positive ETCO2 and breath sounds checked- equal and bilateral Tube secured with: Tape Dental Injury: Teeth and Oropharynx as per pre-operative assessment

## 2020-12-08 NOTE — Transfer of Care (Signed)
Immediate Anesthesia Transfer of Care Note  Patient: Media planner  Procedure(s) Performed: CARPAL TUNNEL RELEASE (Right: Hand) RELEASE TRIGGER FINGER/A-1 PULLEY (Right: Hand)  Patient Location: PACU  Anesthesia Type:General  Level of Consciousness: awake  Airway & Oxygen Therapy: Patient Spontanous Breathing and Patient connected to nasal cannula oxygen  Post-op Assessment: Report given to RN and Post -op Vital signs reviewed and stable  Post vital signs: Reviewed and stable  Last Vitals:  Vitals Value Taken Time  BP    Temp    Pulse 87   Resp    SpO2 100%     Last Pain:  Vitals:   12/08/20 0701  TempSrc: Oral  PainSc: 6       Patients Stated Pain Goal: 8 (12/08/20 0701)  Complications: No notable events documented.

## 2020-12-08 NOTE — Op Note (Addendum)
Orthopaedic Surgery Operative Note (CSN: [id number])  Sandra Parsons  12-23-1970 Date of Surgery: 12/08/2020   Diagnoses:  Right carpal tunnel syndrome; right index and long finger trigger finger  Procedure: Right Open Carpal Tunnel Release; A1 pulley release of right index and long fingers   Operative Finding Successful completion of the planned procedure.  Release of transverse carpal ligament and A1 pulley to right index and long fingers.    Post-Op Diagnosis: Same Surgeons:Primary: [name], MD Location: [facility] OR ROOM 4 Anesthesia: General Antibiotics: Ancef 2 g Tourniquet time:  Total Tourniquet Time Documented: Upper Arm (Right) - 40 minutes Total: Upper Arm (Right) - 40 minutes Estimated Blood Loss: Minimal Complications: None Specimens: None Implants: * No implants in log *  Indications for Surgery:   Sandra Parsons is a 50 y.o. female with progressively worsening numbness and tingling in the median nerve distribution on the right hand.  EMG demonstrates severe sensory and motor latency.  She also has pain, catching and triggering of the right index and long fingers.  She previously had excellent relief of triggering to there left long finger .  Benefits and risks of operative and nonoperative management were discussed prior to surgery with patient/guardian(s) and informed consent form was completed.  Specific risks including infection, need for additional surgery, bleeding, recurrence of her symptoms, damage to surrounding structures, incomplete resolution of her symptoms and more severe complications associated with anesthesia.  She elected to proceed.    Procedure:   The patient was identified properly. Informed consent was obtained and the surgical site was marked. The patient was taken to the OR where general anesthesia was induced.  The patient was positioned supine, on a hand table.  The right arm was prepped and draped in the usual sterile fashion.  Timeout  was performed before the beginning of the case.  Tourniquet was used for the above duration. She received 2 g of Ancef prior to making incision.   Incision was made in line with the radial border of the ring finger. The carpal tunnel transverse fascia was identified, cleaned, and incised sharply. The common sensory branches were visualized along with the superficial palmar arch and protected.  The median nerve was protected below. Deep retractors were placed underneath the transverse carpal ligament, protecting the nerve. I released the ligament completely, and then released the proximal distal volar forearm fascia. The nerve was identified, and visualized, and protected throughout the case. No masses or abnormalities were identified in ulnar bursa.    Next we turned our attention to the index and ling fingers.  We made an incision through skin, overlying the A1 pulley to the Index finger and long finger.  The incision was made in between the A1 pulleys, and we had sufficient skin excursion to isolate each individual pulley.  Starting with the index finger, we continued with blunt dissection to the level of the A1 pulley.  We identified the adjacent neurovascular bundles and protected them throughout the case.  The leading edge of the A1 pulley was identified and incised sharply with a knife.  We then used scissors to completely release the pulley distally, and then proximally.  The A1 pulley was noted to be very thick with some irritation of the underlying tendon.  We then used a tendon hook to remove the tendon from its usual position to ensure that the A1 pulley was released and the tendon moving freely.  We passively flexed and extended the Index finger  and noted  there were no additional adhesions our catching.  We then turned our attention to the long finger.  The neurovascular bundles were identified and protected.  The A1 pulley was isolated and incised sharply with a knife.  We then used scissors to  complete the release proximally and distally.   A tendon hook was introduced and the tendons were removed from the resting position to ensure that the tendons moved freely.  At this point we were satisfied with the release of the A1 pulleys to the index and long fingers.   The wounds were irrigated copiously, the wounds injected with marcaine, and the skin closed with 3-0 prolene.  A soft, bulky dressing was placed.  Patient was awoken taken to PACU in stable condition.   Post-operative plan:  The patient will be discharged home from the PACU. WBAT on the operative extremity; limit lifting to nothing more than a coffee cup until follow up appointment   DVT prophylaxis not indicated in this ambulatory upper extremity patient without significant risk factors.    Pain control with PRN pain medication preferring oral medicines.   Follow up plan will be scheduled in approximately 10-14 days for incision check

## 2020-12-08 NOTE — Anesthesia Preprocedure Evaluation (Signed)
Anesthesia Evaluation  Patient identified by MRN, date of birth, ID band Patient awake    Reviewed: Allergy & Precautions, H&P , NPO status , Patient's Chart, lab work & pertinent test results, reviewed documented beta blocker date and time   History of Anesthesia Complications (+) PONV and history of anesthetic complications  Airway Mallampati: II  TM Distance: >3 FB Neck ROM: full    Dental no notable dental hx.    Pulmonary neg pulmonary ROS, former smoker,    Pulmonary exam normal breath sounds clear to auscultation       Cardiovascular Exercise Tolerance: Good hypertension, negative cardio ROS   Rhythm:regular Rate:Normal     Neuro/Psych PSYCHIATRIC DISORDERS Depression negative neurological ROS     GI/Hepatic negative GI ROS, (+) Hepatitis -, C  Endo/Other  negative endocrine ROSdiabetes  Renal/GU negative Renal ROS  negative genitourinary   Musculoskeletal   Abdominal   Peds  Hematology negative hematology ROS (+)   Anesthesia Other Findings   Reproductive/Obstetrics negative OB ROS                             Anesthesia Physical Anesthesia Plan  ASA: 2  Anesthesia Plan: General and General LMA   Post-op Pain Management:    Induction:   PONV Risk Score and Plan: Ondansetron  Airway Management Planned:   Additional Equipment:   Intra-op Plan:   Post-operative Plan:   Informed Consent: I have reviewed the patients History and Physical, chart, labs and discussed the procedure including the risks, benefits and alternatives for the proposed anesthesia with the patient or authorized representative who has indicated his/her understanding and acceptance.     Dental Advisory Given  Plan Discussed with: CRNA  Anesthesia Plan Comments:         Anesthesia Quick Evaluation  

## 2020-12-08 NOTE — Anesthesia Postprocedure Evaluation (Signed)
Anesthesia Post Note  Patient: Media planner  Procedure(s) Performed: CARPAL TUNNEL RELEASE (Right: Hand) RELEASE TRIGGER FINGER/A-1 PULLEY (Right: Hand)  Patient location during evaluation: Phase II Anesthesia Type: General Level of consciousness: awake Pain management: pain level controlled Vital Signs Assessment: post-procedure vital signs reviewed and stable Respiratory status: spontaneous breathing and respiratory function stable Cardiovascular status: blood pressure returned to baseline and stable Postop Assessment: no headache and no apparent nausea or vomiting Anesthetic complications: no Comments: Late entry   No notable events documented.   Last Vitals:  Vitals:   12/08/20 0854 12/08/20 0908  BP:  (!) 164/78  Pulse: 84 70  Resp: 16 18  Temp:  36.5 C  SpO2: 98% 99%    Last Pain:  Vitals:   12/08/20 0908  TempSrc: Oral  PainSc: 0-No pain                 Windell Norfolk

## 2020-12-08 NOTE — H&P (Signed)
Below is the most recent clinic note for [facility]; any pertinent information regarding their recent medical history will be updated on the day of surgery.    Orthopaedic Postop Note   Assessment: Sandra Parsons is a 50 y.o. female s/p left long finger trigger release   DOS: 09/01/2020   Plan: Left long finger stiffness and pain has improved.  She is very happy with the results.  At this point, she continues to have pain, tenderness and triggering of the right index and long fingers.  She also has documented evidence of severe right carpal tunnel syndrome.  She has tried injections for trigger finger in her right hand, without sustained relief of her symptoms.  She has been able to discuss everything with her work, and would like to proceed with surgery on her right hand.  She would like to proceed with multiple procedures on the right hand on December 08, 2020.   Risks and benefits of the surgery, including, but not limited to infection, bleeding, persistent pain, need for further surgery, incomplete resolution of her symptoms - specifically the numbness and tingling, and more severe complications associated with anesthesia were discussed with the patient.  The patient has elected to proceed.   Surgical Plan   Procedure: Right carpal tunnel release; right index and long finger trigger finger release Disposition: Outpatient Anesthesia: General, with local anesthesia Medical Comorbidities: Diabetes (well-controlled), hypothyroid     Follow-up: Return for After surgery; DOS 12/08/20. XR at next visit: None   Subjective:       Chief Complaint  Patient presents with   Routine Post Op      DOS 09/01/20/Trigger Finger/left middle finger/pt states left hand is feeling good      History of Present Illness: Sandra Parsons is a 50 y.o. female who presents following the above stated procedure.  Surgery on the left long finger was approximately 7 weeks ago.  She is doing well.  She is  not having any pain.  Stiffness has improved following surgery.  She denies any catching or locking sensations.  She is very pleased with the results.  She has noticed some tenderness around the incision, but this is improving.  She has obtained EMG for her right hand, which demonstrates severe carpal tunnel syndrome in her right hand.  She is also continued to have pain, tenderness and locking of the index and long finger.  She has discussed the possibility of proceeding with another surgery with her work, and they are able to accommodate this.  She would like to plan for surgery in approximately 1 month.  No medications at this time.  She has returned to work.   Review of Systems: No fevers or chills No numbness or tingling No Chest Pain No shortness of breath     Objective: LMP 04/26/2016    Physical Exam:   Alert and oriented, no acute distress   Left hand surgical incision is healing well.  No surrounding erythema or drainage.  There is some scar formation underneath the surgical incision.  She is able to fully extend the long finger without pain.  No catching or triggering noted in clinic today.   Right hand with some atrophy within the thenar musculature.  Positive Tinel's at the carpal tunnel.  Decreased sensation in the median nerve distribution.  She has exquisite tenderness to palpation over the A1 pulley to the index finger, as well as some tenderness to palpation to the A1 pulley to the long finger.  Active triggering noted to the index finger.  No active triggering to the long finger.  She has pain in these areas when she makes a full fist.  IMAGING: I personally ordered and reviewed the following images:   EMG results for the right hand significant for severe right carpal tunnel syndrome.

## 2020-12-09 ENCOUNTER — Encounter (HOSPITAL_COMMUNITY): Payer: Self-pay | Admitting: Orthopedic Surgery

## 2020-12-15 ENCOUNTER — Other Ambulatory Visit: Payer: Self-pay | Admitting: Physician Assistant

## 2020-12-17 LAB — IFOBT (OCCULT BLOOD): IFOBT: POSITIVE

## 2020-12-19 ENCOUNTER — Encounter: Payer: Self-pay | Admitting: Orthopedic Surgery

## 2020-12-19 ENCOUNTER — Ambulatory Visit (INDEPENDENT_AMBULATORY_CARE_PROVIDER_SITE_OTHER): Payer: Self-pay | Admitting: Orthopedic Surgery

## 2020-12-19 ENCOUNTER — Other Ambulatory Visit: Payer: Self-pay

## 2020-12-19 VITALS — Ht 67.0 in | Wt 177.0 lb

## 2020-12-19 DIAGNOSIS — M65321 Trigger finger, right index finger: Secondary | ICD-10-CM

## 2020-12-19 DIAGNOSIS — M65331 Trigger finger, right middle finger: Secondary | ICD-10-CM

## 2020-12-19 DIAGNOSIS — G5601 Carpal tunnel syndrome, right upper limb: Secondary | ICD-10-CM

## 2020-12-19 MED ORDER — CEPHALEXIN 250 MG PO CAPS: 250.0000 mg | ORAL_CAPSULE | Freq: Four times a day (QID) | ORAL | 0 refills | Status: DC

## 2020-12-19 NOTE — Patient Instructions (Signed)
OK to return to work - light duty, no heavy lifting for 2 more weeks.

## 2020-12-19 NOTE — Progress Notes (Addendum)
Orthopaedic Postop Note  Assessment: Sandra Parsons is a 50 y.o. female s/p left long finger trigger release (DOS: 09/01/2020); now s/p R CTR and R index and long finger trigger release.   DOS: 12/08/2020  Plan: Sutures removed in clinic today.  Some residual scabbing.  Trigger finger incision with small amount of cloudy fluid.  We will start her on Keflex.  Follow-up in 4 weeks for repeat evaluation.  It is okay for her to return to work, but I recommend keeping the hand covered while continues to heal.  She should limit her lifting.  She is aware that her carpal tunnel symptoms were severe, and it could take several months for resolution of symptoms.  It is also possible that her symptoms persist, due to the severity.   Follow-up: Return in about 4 weeks (around 01/16/2021). XR at next visit: None  Subjective:  Chief Complaint  Patient presents with   Routine Post Op    Pt hand CTR and trigger release DOS 12/08/20    History of Present Illness: Sandra Parsons is a 50 y.o. female who presents following the above stated procedure.  Surgery was just less than 2 weeks ago.  She is recovered well.  Pain is controlled.  She notes improved symptoms related to her triggering.  She has last tingling and radiating pains to the radial hand.  She still has some decreased sensation in the median nerve distribution.  No fevers or chills.  She is interested in returning to work, as they need her there.  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  Review of Systems: No fevers or chills + numbness and tingling No Chest Pain No shortness of breath   Objective: Ht 5\' 7"  (1.702 m)   Wt 177 lb (80.3 kg)   LMP 04/26/2016   BMI 27.72 kg/m   Physical Exam:  Alert and oriented, no acute distress  Right hand carpal tunnel incision is healing well.  No surrounding erythema or drainage.  She does have a small amount of residual scabbing.  This is dry.  No tenderness to palpation.   Negative Tinel's.  Decreased sensation in median nerve distribution.  Trigger finger incision with a small amount of swelling.  Mild tenderness to palpation of this area.  There is a small amount of cloudy fluid, without obvious fluctuance.  No triggering or catching is appreciated.  IMAGING: I personally ordered and reviewed the following images:  No new imaging obtained today  06/24/2016, MD 12/19/2020 9:41 AM

## 2021-01-08 ENCOUNTER — Other Ambulatory Visit: Payer: Self-pay | Admitting: Physician Assistant

## 2021-01-08 DIAGNOSIS — R195 Other fecal abnormalities: Secondary | ICD-10-CM

## 2021-01-13 ENCOUNTER — Telehealth: Payer: Self-pay

## 2021-01-13 ENCOUNTER — Encounter: Payer: Self-pay | Admitting: Gastroenterology

## 2021-01-13 NOTE — Telephone Encounter (Signed)
Call from pt who thinks she has a UTI, reports frequent urination & pain when urinating, pt states she has has this issue for the last 3 days, requesting Rx sent to pharmacy as she has only been taking aleve with no relief.

## 2021-01-14 ENCOUNTER — Ambulatory Visit: Payer: Self-pay | Admitting: Physician Assistant

## 2021-01-14 ENCOUNTER — Encounter: Payer: Self-pay | Admitting: Physician Assistant

## 2021-01-14 ENCOUNTER — Telehealth: Payer: Self-pay

## 2021-01-14 ENCOUNTER — Other Ambulatory Visit: Payer: Self-pay

## 2021-01-14 VITALS — BP 160/72 | HR 76 | Temp 97.1°F | Wt 172.5 lb

## 2021-01-14 DIAGNOSIS — R300 Dysuria: Secondary | ICD-10-CM

## 2021-01-14 LAB — POCT URINALYSIS DIPSTICK
Bilirubin, UA: NEGATIVE
Blood, UA: NEGATIVE
Glucose, UA: NEGATIVE
Ketones, UA: NEGATIVE
Leukocytes, UA: NEGATIVE
Nitrite, UA: NEGATIVE
Protein, UA: POSITIVE — AB
Spec Grav, UA: >=1.03 — AB (ref 1.010–1.025)
Urobilinogen, UA: 0.2 E.U./dL
pH, UA: 5.5 (ref 5.0–8.0)

## 2021-01-14 MED ORDER — CEPHALEXIN 500 MG PO CAPS: 500.0000 mg | ORAL_CAPSULE | Freq: Four times a day (QID) | ORAL | 0 refills | Status: AC

## 2021-01-14 NOTE — Progress Notes (Signed)
BP (!) 160/72   Pulse 76   Temp (!) 97.1 F (36.2 C)   Wt 172 lb 8 oz (78.2 kg)   LMP 04/26/2016   SpO2 98%   BMI 27.02 kg/m    Subjective:    Patient ID: Sandra Parsons, female    DOB: [birth year], 50 y.o.   MRN: [id number]  HPI: Sandra Parsons is a 50 y.o. female presenting on 01/14/2021 for Dysuria (Lower back pain)   HPI   Chief Complaint  Patient presents with   Dysuria    Lower back pain    She says she is going frequently but doesn't have much come out.  She is not burning with urinates.  She has lower abdominal pain with some radiation to right flank.  She says this is how her cystitis usually presents.   FBS 119 this morning.  Her tooth recently came out.   Her BMs are regular and normal.      Relevant past medical, surgical, family and social history reviewed and updated as indicated. Interim medical history since our last visit reviewed. Allergies and medications reviewed and updated.   Current Outpatient Medications:    atorvastatin (LIPITOR) 20 MG tablet, TAKE 1 Tablet BY MOUTH ONCE EVERY DAY, Disp: 90 tablet, Rfl: 1   gabapentin (NEURONTIN) 100 MG capsule, TAKE 1 CAPSULE BY MOUTH TWICE DAILY AS NEEDED, Disp: 60 capsule, Rfl: 0   insulin glargine (LANTUS SOLOSTAR) 100 UNIT/ML Solostar Pen, Inject 25 Units into the skin daily., Disp: 15 mL, Rfl: 0   JANUMET 50-500 MG tablet, TAKE 1 Tablet  BY MOUTH TWICE DAILY WITH A MEAL, Disp: 180 tablet, Rfl: 1   levothyroxine (SYNTHROID) 50 MCG tablet, TAKE 1 Tablet BY MOUTH ONCE EVERY DAY, Disp: 90 tablet, Rfl: 1   Lidocaine 4 % PTCH, Apply 1 patch topically daily as needed (pain (associated with pinch nerve pain when working))., Disp: , Rfl:    lisinopril (ZESTRIL) 20 MG tablet, Take 1 tablet by mouth once daily, Disp: 30 tablet, Rfl: 3   naproxen sodium (ALEVE) 220 MG tablet, Take 220 mg by mouth 2 (two) times daily with a meal. (Patient not taking: Reported on 01/14/2021), Disp: , Rfl:    triamcinolone (KENALOG)  0.025 % ointment, Apply 1 application topically at bedtime as needed. (Patient not taking: Reported on 01/14/2021), Disp: 15 g, Rfl: 1    Review of Systems  Per HPI unless specifically indicated above     Objective:    BP (!) 160/72   Pulse 76   Temp (!) 97.1 F (36.2 C)   Wt 172 lb 8 oz (78.2 kg)   LMP 04/26/2016   SpO2 98%   BMI 27.02 kg/m   Wt Readings from Last 3 Encounters:  01/14/21 172 lb 8 oz (78.2 kg)  12/19/20 177 lb (80.3 kg)  12/08/20 177 lb (80.3 kg)    Physical Exam Vitals reviewed.  Constitutional:      General: She is not in acute distress.    Appearance: She is well-developed. She is not ill-appearing.  HENT:     Head: Normocephalic and atraumatic.  Cardiovascular:     Rate and Rhythm: Normal rate and regular rhythm.  Pulmonary:     Effort: Pulmonary effort is normal.     Breath sounds: Normal breath sounds.  Abdominal:     General: Bowel sounds are normal.     Palpations: Abdomen is soft. There is no hepatomegaly, splenomegaly, mass or pulsatile mass.  Tenderness: There is no abdominal tenderness. There is no right CVA tenderness, left CVA tenderness, guarding or rebound.  Musculoskeletal:     Cervical back: Neck supple.  Lymphadenopathy:     Cervical: No cervical adenopathy.  Skin:    General: Skin is warm and dry.  Neurological:     Mental Status: She is alert and oriented to person, place, and time.  Psychiatric:        Behavior: Behavior normal.    Urinalysis    Component Value Date/Time   COLORURINE YELLOW 04/26/2019 1705   APPEARANCEUR HAZY (A) 04/26/2019 1705   APPEARANCEUR Clear 10/11/2013 0239   LABSPEC 1.025 04/26/2019 1705   LABSPEC 1.020 10/11/2013 0239   PHURINE 5.0 04/26/2019 1705   GLUCOSEU NEGATIVE 04/26/2019 1705   GLUCOSEU >=500 10/11/2013 0239   HGBUR NEGATIVE 04/26/2019 1705   BILIRUBINUR negative 01/14/2021 0847   BILIRUBINUR Negative 10/11/2013 0239   KETONESUR NEGATIVE 04/26/2019 1705   PROTEINUR Positive  (A) 01/14/2021 0847   PROTEINUR 100 (A) 04/26/2019 1705   UROBILINOGEN 0.2 01/14/2021 0847   NITRITE negative 01/14/2021 0847   NITRITE NEGATIVE 04/26/2019 1705   LEUKOCYTESUR Negative 01/14/2021 0847   LEUKOCYTESUR TRACE (A) 04/26/2019 1705   LEUKOCYTESUR Negative 10/11/2013 0239          Assessment & Plan:    Encounter Diagnosis  Name Primary?   Dysuria Yes       -Rx keflex.  Will send urine for culture -pt is on dental list -follow up December as scheduled.  She is to contact office sooner prn

## 2021-01-14 NOTE — Telephone Encounter (Signed)
Received call from lab stating that pt urine sample rejected due to not being labeled, spoke with pt who was able to come to clinic to provide another sample today.

## 2021-01-15 ENCOUNTER — Other Ambulatory Visit: Payer: Self-pay | Admitting: Physician Assistant

## 2021-01-15 DIAGNOSIS — R300 Dysuria: Secondary | ICD-10-CM

## 2021-01-16 ENCOUNTER — Encounter: Payer: Self-pay | Admitting: Orthopedic Surgery

## 2021-01-16 ENCOUNTER — Ambulatory Visit (INDEPENDENT_AMBULATORY_CARE_PROVIDER_SITE_OTHER): Payer: Self-pay | Admitting: Orthopedic Surgery

## 2021-01-16 ENCOUNTER — Other Ambulatory Visit: Payer: Self-pay

## 2021-01-16 DIAGNOSIS — M65331 Trigger finger, right middle finger: Secondary | ICD-10-CM

## 2021-01-16 DIAGNOSIS — M65321 Trigger finger, right index finger: Secondary | ICD-10-CM

## 2021-01-16 DIAGNOSIS — G5601 Carpal tunnel syndrome, right upper limb: Secondary | ICD-10-CM

## 2021-01-16 DIAGNOSIS — M778 Other enthesopathies, not elsewhere classified: Secondary | ICD-10-CM

## 2021-01-16 LAB — URINE CULTURE: Culture: <10000 — AB

## 2021-01-16 NOTE — Patient Instructions (Signed)

## 2021-01-16 NOTE — Progress Notes (Signed)
Orthopaedic Postop Note  Assessment: Sandra Parsons is a 50 y.o. female s/p left long finger trigger release (DOS: 09/01/2020); now s/p R CTR and R index and long finger trigger release.  Right rotator cuff tendonitis  DOS: 12/08/2020  Plan: Incisions have healed.  Drainage and dehiscence improved with keflex.  She no longer has shooting pains in her thumb, index and long fingers.  She still has numbness to index and ling fingers, but her ability to sense temperature changes has improved.  She no longer has locking sensations of the index and long finger.  Overall, her symptoms have improved.    She is aware that her carpal tunnel symptoms were severe, and it could take several months for resolution of symptoms.  It is also possible that her symptoms persist, due to the severity.   She also has some pain with overhead motion of the right shoulder.  No specific injury.  Motion is pretty good.  Recommend Naproxen and provided a home exercise program.  If these symptoms do not improve, I am happy to see her for her right shoulder.   Otherwise, follow up in 3 months.   Follow-up: Return in about 3 months (around 04/18/2021). XR at next visit: None  Subjective:  Chief Complaint  Patient presents with   Routine Post Op    Rt CTR DOS 12/08/20    History of Present Illness: Sandra Parsons is a 50 y.o. female who presents following the above stated procedure.  Surgery was 6 weeks ago.  Her symptoms are better.  She no longer has radiating pains to her index and long fingers.  She tolerated the antibiotics and the drainage improved.  No pain at the incision sites.  She is currently on antibiotics again for a "kidney infection".  She also has a slowly healing lesion on the dorsum of her right index finger.  Currently, she is not having any fevers or chills.     Review of Systems: No fevers or chills Numbness to the index and long fingers.  No Chest Pain No shortness of breath   Objective: LMP  04/26/2016   Physical Exam:  Alert and oriented, no acute distress  Carpal tunnel and trigger finger incisions are healing well.  No surrounding erythema or drainage.  Trigger finger incision with some palpable scar tissue, but this is not tender.  She can make a fist.  No triggering.  Negative Tinel's at the carpal tunnel.    Right shoulder without deformity.  Near full range of motion with some discomfort with overhead motion.  No gross instability.   IMAGING: I personally ordered and reviewed the following images:  No new imaging obtained today  [name], MD 01/16/2021 8:50 AM

## 2021-02-04 ENCOUNTER — Other Ambulatory Visit: Payer: Self-pay | Admitting: Physician Assistant

## 2021-02-09 ENCOUNTER — Other Ambulatory Visit: Payer: Self-pay | Admitting: Physician Assistant

## 2021-02-09 DIAGNOSIS — E785 Hyperlipidemia, unspecified: Secondary | ICD-10-CM

## 2021-02-09 DIAGNOSIS — E118 Type 2 diabetes mellitus with unspecified complications: Secondary | ICD-10-CM

## 2021-02-09 DIAGNOSIS — I10 Essential (primary) hypertension: Secondary | ICD-10-CM

## 2021-02-09 DIAGNOSIS — E039 Hypothyroidism, unspecified: Secondary | ICD-10-CM

## 2021-02-09 DIAGNOSIS — N1831 Chronic kidney disease, stage 3a: Secondary | ICD-10-CM

## 2021-02-24 ENCOUNTER — Other Ambulatory Visit: Payer: Self-pay | Admitting: Physician Assistant

## 2021-02-24 MED ORDER — GABAPENTIN 100 MG PO CAPS: 100.0000 mg | ORAL_CAPSULE | Freq: Two times a day (BID) | ORAL | 0 refills | Status: DC | PRN

## 2021-02-25 ENCOUNTER — Ambulatory Visit: Payer: Self-pay | Admitting: Physician Assistant

## 2021-02-25 ENCOUNTER — Telehealth: Payer: Self-pay

## 2021-02-25 NOTE — Telephone Encounter (Signed)
Spoke with pt & informed insulin delivered. °

## 2021-03-10 ENCOUNTER — Ambulatory Visit: Payer: Self-pay | Admitting: Physician Assistant

## 2021-03-10 DIAGNOSIS — K0889 Other specified disorders of teeth and supporting structures: Secondary | ICD-10-CM

## 2021-03-10 DIAGNOSIS — K029 Dental caries, unspecified: Secondary | ICD-10-CM

## 2021-03-10 MED ORDER — AMOXICILLIN 500 MG PO CAPS: 500.0000 mg | ORAL_CAPSULE | Freq: Three times a day (TID) | ORAL | 0 refills | Status: AC

## 2021-03-10 NOTE — Progress Notes (Signed)
° °  LMP 04/26/2016    Subjective:    Patient ID: Sandra Parsons, female    DOB: 1970-07-06, 50 y.o.   MRN: [id number]  HPI: Sandra Parsons is a 50 y.o. female presenting on 03/10/2021 for No chief complaint on file.   HPI  This is a telemedicine appointment through [employer].  I connected with  Anne-Marie Belisle on 03/10/21 by a video enabled telemedicine application and verified that I am speaking with the correct person using two identifiers.   I discussed the limitations of evaluation and management by telemedicine. The patient expressed understanding and agreed to proceed.  Pt is at work.  Provider is at office.   Pt complains of Bottom left teeth pain.  She says they have been hurting x 3 or 4 day.  She says she Has chills and thinks she has a fever but has not checked it.     Relevant past medical, surgical, family and social history reviewed and updated as indicated. Interim medical history since our last visit reviewed. Allergies and medications reviewed and updated.  Review of Systems  Per HPI unless specifically indicated above     Objective:    LMP 04/26/2016   Wt Readings from Last 3 Encounters:  01/14/21 172 lb 8 oz (78.2 kg)  12/19/20 177 lb (80.3 kg)  12/08/20 177 lb (80.3 kg)    Physical Exam Constitutional:      General: She is not in acute distress.    Appearance: She is not toxic-appearing.     Comments: Pt looks like she doesn't feel well.  HENT:     Head: Normocephalic and atraumatic.     Mouth/Throat:     Dentition: Abnormal dentition. Dental caries present.     Comments: There are teeth that are dark and decayed.  There is no obvious swelling of the face.  No trismus.  Pulmonary:     Effort: Pulmonary effort is normal. No respiratory distress.     Comments: Pt is talking in complete sentences without dyspnea Neurological:     Mental Status: She is oriented to person, place, and time.  Psychiatric:        Attention and Perception: Attention  normal.        Speech: Speech normal.        Behavior: Behavior is cooperative.          Assessment & Plan:    Encounter Diagnoses  Name Primary?   Dentalgia Yes   Dental decay     -rx amoxil  -pt on dental list -note to leave work will be emailed to pt as discussed -follow up January as scheduled.  Contact office sooner prn

## 2021-03-25 ENCOUNTER — Other Ambulatory Visit (HOSPITAL_COMMUNITY)
Admission: RE | Admit: 2021-03-25 | Discharge: 2021-03-25 | Disposition: A | Discharge status: Home / Self Care | Payer: Self-pay | Source: Ambulatory Visit | Attending: Physician Assistant | Admitting: Physician Assistant

## 2021-03-25 ENCOUNTER — Other Ambulatory Visit: Payer: Self-pay

## 2021-03-25 ENCOUNTER — Other Ambulatory Visit (HOSPITAL_COMMUNITY)
Admission: RE | Admit: 2021-03-25 | Discharge: 2021-03-25 | Disposition: A | Discharge status: Home / Self Care | Payer: Self-pay | Source: Ambulatory Visit | Attending: Nephrology | Admitting: Nephrology

## 2021-03-25 DIAGNOSIS — E118 Type 2 diabetes mellitus with unspecified complications: Secondary | ICD-10-CM

## 2021-03-25 DIAGNOSIS — E1122 Type 2 diabetes mellitus with diabetic chronic kidney disease: Secondary | ICD-10-CM | POA: Insufficient documentation

## 2021-03-25 DIAGNOSIS — E039 Hypothyroidism, unspecified: Secondary | ICD-10-CM

## 2021-03-25 DIAGNOSIS — E785 Hyperlipidemia, unspecified: Secondary | ICD-10-CM

## 2021-03-25 DIAGNOSIS — D631 Anemia in chronic kidney disease: Secondary | ICD-10-CM | POA: Insufficient documentation

## 2021-03-25 DIAGNOSIS — E1129 Type 2 diabetes mellitus with other diabetic kidney complication: Secondary | ICD-10-CM | POA: Insufficient documentation

## 2021-03-25 DIAGNOSIS — R809 Proteinuria, unspecified: Secondary | ICD-10-CM | POA: Insufficient documentation

## 2021-03-25 DIAGNOSIS — Z79899 Other long term (current) drug therapy: Secondary | ICD-10-CM | POA: Insufficient documentation

## 2021-03-25 DIAGNOSIS — N189 Chronic kidney disease, unspecified: Secondary | ICD-10-CM | POA: Insufficient documentation

## 2021-03-25 DIAGNOSIS — F1220 Cannabis dependence, uncomplicated: Secondary | ICD-10-CM | POA: Insufficient documentation

## 2021-03-25 DIAGNOSIS — E875 Hyperkalemia: Secondary | ICD-10-CM | POA: Insufficient documentation

## 2021-03-25 LAB — COMPREHENSIVE METABOLIC PANEL
ALT: 23 U/L (ref 0–44)
AST: 24 U/L (ref 15–41)
Albumin: 4.2 g/dL (ref 3.5–5.0)
Alkaline Phosphatase: 62 U/L (ref 38–126)
Anion gap: 8 (ref 5–15)
BUN: 26 mg/dL — ABNORMAL HIGH (ref 6–20)
CO2: 20 mmol/L — ABNORMAL LOW (ref 22–32)
Calcium: 9.6 mg/dL (ref 8.9–10.3)
Chloride: 112 mmol/L — ABNORMAL HIGH (ref 98–111)
Creatinine, Ser: 1.18 mg/dL — ABNORMAL HIGH (ref 0.44–1.00)
GFR, Estimated: 57 mL/min — ABNORMAL LOW (ref >60)
Glucose, Bld: 118 mg/dL — ABNORMAL HIGH (ref 70–99)
Potassium: 4.6 mmol/L (ref 3.5–5.1)
Sodium: 140 mmol/L (ref 135–145)
Total Bilirubin: 0.7 mg/dL (ref 0.3–1.2)
Total Protein: 8.1 g/dL (ref 6.5–8.1)

## 2021-03-25 LAB — CREATININE CLEARANCE, URINE, 24 HOUR
Collection Interval-CRCL: 24 hours
Creatinine Clearance: 86 mL/min (ref 75–115)
Creatinine, 24H Ur: 1454 mg/d (ref 600–1800)
Creatinine, Urine: 93.2 mg/dL
Urine Total Volume-CRCL: 1560 mL

## 2021-03-25 LAB — CBC WITH DIFFERENTIAL/PLATELET
Abs Immature Granulocytes: 0.02 10*3/uL (ref 0.00–0.07)
Basophils Absolute: 0.2 10*3/uL — ABNORMAL HIGH (ref 0.0–0.1)
Basophils Relative: 2 %
Eosinophils Absolute: 0.9 10*3/uL — ABNORMAL HIGH (ref 0.0–0.5)
Eosinophils Relative: 10 %
HCT: 32 % — ABNORMAL LOW (ref 36.0–46.0)
Hemoglobin: 10.9 g/dL — ABNORMAL LOW (ref 12.0–15.0)
Immature Granulocytes: 0 %
Lymphocytes Relative: 30 %
Lymphs Abs: 2.6 10*3/uL (ref 0.7–4.0)
MCH: 32.5 pg (ref 26.0–34.0)
MCHC: 34.1 g/dL (ref 30.0–36.0)
MCV: 95.5 fL (ref 80.0–100.0)
Monocytes Absolute: 0.6 10*3/uL (ref 0.1–1.0)
Monocytes Relative: 7 %
Neutro Abs: 4.4 10*3/uL (ref 1.7–7.7)
Neutrophils Relative %: 51 %
Platelets: 298 10*3/uL (ref 150–400)
RBC: 3.35 MIL/uL — ABNORMAL LOW (ref 3.87–5.11)
RDW: 12.4 % (ref 11.5–15.5)
WBC: 8.7 10*3/uL (ref 4.0–10.5)
nRBC: 0 % (ref 0.0–0.2)

## 2021-03-25 LAB — LIPID PANEL
Cholesterol: 183 mg/dL (ref 0–200)
HDL: 43 mg/dL (ref >40)
LDL Cholesterol: 115 mg/dL — ABNORMAL HIGH (ref 0–99)
Total CHOL/HDL Ratio: 4.3 RATIO
Triglycerides: 124 mg/dL (ref <150)
VLDL: 25 mg/dL (ref 0–40)

## 2021-03-25 LAB — PHOSPHORUS: Phosphorus: 3.2 mg/dL (ref 2.5–4.6)

## 2021-03-25 LAB — PROTEIN, URINE, 24 HOUR
Collection Interval-UPROT: 24 hours
Protein, 24H Urine: 749 mg/d — ABNORMAL HIGH (ref 50–100)
Urine Total Volume-UPROT: 1560 mL

## 2021-03-25 LAB — FERRITIN: Ferritin: 19 ng/mL (ref 11–307)

## 2021-03-25 LAB — PROTEIN / CREATININE RATIO, URINE
Creatinine, Urine: 91.31 mg/dL
Protein Creatinine Ratio: 0.51 mg/mg{Cre} — ABNORMAL HIGH (ref 0.00–0.15)
Total Protein, Urine: 47 mg/dL

## 2021-03-25 LAB — VITAMIN D 25 HYDROXY (VIT D DEFICIENCY, FRACTURES): Vit D, 25-Hydroxy: 13.99 ng/mL — ABNORMAL LOW (ref 30–100)

## 2021-03-25 LAB — IRON AND TIBC
Iron: 59 ug/dL (ref 28–170)
Saturation Ratios: 18 % (ref 10.4–31.8)
TIBC: 334 ug/dL (ref 250–450)
UIBC: 275 ug/dL

## 2021-03-25 LAB — HIV ANTIBODY (ROUTINE TESTING W REFLEX): HIV Screen 4th Generation wRfx: NONREACTIVE

## 2021-03-25 LAB — HEMOGLOBIN A1C
Hgb A1c MFr Bld: 6.7 % — ABNORMAL HIGH (ref 4.8–5.6)
Mean Plasma Glucose: 145.59 mg/dL

## 2021-03-25 LAB — HEPATITIS C ANTIBODY: HCV Ab: REACTIVE — AB

## 2021-03-25 LAB — MAGNESIUM: Magnesium: 1.7 mg/dL (ref 1.7–2.4)

## 2021-03-25 LAB — VITAMIN B12: Vitamin B-12: 481 pg/mL (ref 180–914)

## 2021-03-25 LAB — URIC ACID: Uric Acid, Serum: 3.3 mg/dL (ref 2.5–7.1)

## 2021-03-25 LAB — HEPATITIS B SURFACE ANTIGEN: Hepatitis B Surface Ag: NONREACTIVE

## 2021-03-25 LAB — TSH: TSH: 16.747 u[IU]/mL — ABNORMAL HIGH (ref 0.350–4.500)

## 2021-03-26 LAB — PROTEIN ELECTROPHORESIS, SERUM
A/G Ratio: 1.2 (ref 0.7–1.7)
Albumin ELP: 4.1 g/dL (ref 2.9–4.4)
Alpha-1-Globulin: 0.2 g/dL (ref 0.0–0.4)
Alpha-2-Globulin: 0.9 g/dL (ref 0.4–1.0)
Beta Globulin: 1 g/dL (ref 0.7–1.3)
Gamma Globulin: 1.4 g/dL (ref 0.4–1.8)
Globulin, Total: 3.4 g/dL (ref 2.2–3.9)
Total Protein ELP: 7.5 g/dL (ref 6.0–8.5)

## 2021-03-26 LAB — GLOMERULAR BASEMENT MEMBRANE ANTIBODIES: GBM Ab: <0.2 units (ref 0.0–0.9)

## 2021-03-26 LAB — PTH, INTACT AND CALCIUM
Calcium, Total (PTH): 9.5 mg/dL (ref 8.7–10.2)
PTH: 21 pg/mL (ref 15–65)

## 2021-03-26 LAB — KAPPA/LAMBDA LIGHT CHAINS
Kappa free light chain: 68.9 mg/L — ABNORMAL HIGH (ref 3.3–19.4)
Kappa, lambda light chain ratio: 2.41 — ABNORMAL HIGH (ref 0.26–1.65)
Lambda free light chains: 28.6 mg/L — ABNORMAL HIGH (ref 5.7–26.3)

## 2021-03-26 LAB — HEPATITIS B SURFACE ANTIBODY, QUANTITATIVE: Hep B S AB Quant (Post): >1000 m[IU]/mL (ref >9.9)

## 2021-03-26 LAB — ANCA TITERS
Atypical P-ANCA titer: <1:20 {titer}
C-ANCA: <1:20 {titer}
P-ANCA: <1:20 {titer}

## 2021-03-26 LAB — C4 COMPLEMENT: Complement C4, Body Fluid: 24 mg/dL (ref 12–38)

## 2021-03-26 LAB — C3 COMPLEMENT: C3 Complement: 112 mg/dL (ref 82–167)

## 2021-03-26 LAB — ANA: Anti Nuclear Antibody (ANA): NEGATIVE

## 2021-03-26 LAB — MISC LABCORP TEST (SEND OUT): Labcorp test code: 141330

## 2021-03-27 LAB — IMMUNOFIXATION ELECTROPHORESIS
IgA: 358 mg/dL — ABNORMAL HIGH (ref 87–352)
IgG (Immunoglobin G), Serum: 1288 mg/dL (ref 586–1602)
IgM (Immunoglobulin M), Srm: 138 mg/dL (ref 26–217)
Total Protein ELP: 7.6 g/dL (ref 6.0–8.5)

## 2021-03-29 LAB — HEPATITIS C GENOTYPE

## 2021-03-30 ENCOUNTER — Encounter: Payer: Self-pay | Admitting: Physician Assistant

## 2021-03-30 ENCOUNTER — Ambulatory Visit: Payer: Self-pay | Admitting: Physician Assistant

## 2021-03-30 ENCOUNTER — Other Ambulatory Visit: Payer: Self-pay

## 2021-03-30 VITALS — BP 151/65 | HR 82 | Temp 97.3°F | Wt 173.0 lb

## 2021-03-30 DIAGNOSIS — N1831 Chronic kidney disease, stage 3a: Secondary | ICD-10-CM

## 2021-03-30 DIAGNOSIS — E039 Hypothyroidism, unspecified: Secondary | ICD-10-CM

## 2021-03-30 DIAGNOSIS — E785 Hyperlipidemia, unspecified: Secondary | ICD-10-CM

## 2021-03-30 DIAGNOSIS — K029 Dental caries, unspecified: Secondary | ICD-10-CM

## 2021-03-30 DIAGNOSIS — E118 Type 2 diabetes mellitus with unspecified complications: Secondary | ICD-10-CM

## 2021-03-30 DIAGNOSIS — I10 Essential (primary) hypertension: Secondary | ICD-10-CM

## 2021-03-30 DIAGNOSIS — K0889 Other specified disorders of teeth and supporting structures: Secondary | ICD-10-CM

## 2021-03-30 NOTE — Progress Notes (Signed)
BP (!) 151/65    Pulse 82    Temp (!) 97.3 F (36.3 C)    Wt 173 lb (78.5 kg)    LMP 04/26/2016    SpO2 99%    BMI 27.10 kg/m    Subjective:    Patient ID: Sandra Parsons, female    DOB: 09/05/1970, 51 y.o.   MRN: [id number]  HPI: Sandra Parsons is a 51 y.o. female presenting on 03/30/2021 for Diabetes, Hyperlipidemia, Hypertension, and Hypothyroidism   HPI   Chief Complaint  Patient presents with   Diabetes   Hyperlipidemia   Hypertension   Hypothyroidism     She is Still having problems with teeth.  She has lots of them.    Her diet hasn't been good due to her teeth bothering her.   She was seen recently by nephrologist due to CKD.   Relevant past medical, surgical, family and social history reviewed and updated as indicated. Interim medical history since our last visit reviewed. Allergies and medications reviewed and updated.   Current Outpatient Medications:    atorvastatin (LIPITOR) 20 MG tablet, TAKE 1 Tablet BY MOUTH ONCE EVERY DAY, Disp: 90 tablet, Rfl: 1   gabapentin (NEURONTIN) 100 MG capsule, Take 1 capsule (100 mg total) by mouth 2 (two) times daily as needed., Disp: 60 capsule, Rfl: 0   insulin glargine (LANTUS SOLOSTAR) 100 UNIT/ML Solostar Pen, Inject 25 Units into the skin daily., Disp: 15 mL, Rfl: 0   JANUMET 50-500 MG tablet, TAKE 1 Tablet  BY MOUTH TWICE DAILY WITH A MEAL, Disp: 180 tablet, Rfl: 1   levothyroxine (SYNTHROID) 50 MCG tablet, TAKE 1 Tablet BY MOUTH ONCE EVERY DAY, Disp: 90 tablet, Rfl: 1   Lidocaine 4 % PTCH, Apply 1 patch topically daily as needed (pain (associated with pinch nerve pain when working))., Disp: , Rfl:    lisinopril (ZESTRIL) 20 MG tablet, Take 1 tablet by mouth once daily, Disp: 30 tablet, Rfl: 3   naproxen sodium (ALEVE) 220 MG tablet, Take 220 mg by mouth 2 (two) times daily with a meal., Disp: , Rfl:    triamcinolone (KENALOG) 0.025 % ointment, Apply 1 application topically at bedtime as needed. (Patient not taking:  Reported on 03/30/2021), Disp: 15 g, Rfl: 1    Review of Systems  Per HPI unless specifically indicated above     Objective:    BP (!) 151/65    Pulse 82    Temp (!) 97.3 F (36.3 C)    Wt 173 lb (78.5 kg)    LMP 04/26/2016    SpO2 99%    BMI 27.10 kg/m   Wt Readings from Last 3 Encounters:  03/30/21 173 lb (78.5 kg)  01/14/21 172 lb 8 oz (78.2 kg)  12/19/20 177 lb (80.3 kg)    Physical Exam Vitals reviewed.  Constitutional:      General: She is not in acute distress.    Appearance: She is well-developed. She is not ill-appearing.  HENT:     Head: Normocephalic and atraumatic.     Comments: There is no swelling of the face    Mouth/Throat:     Dentition: Abnormal dentition. Dental tenderness and dental caries present.     Comments: Many teeth are missing.  Most teeth still present are decay with many broken off. Cardiovascular:     Rate and Rhythm: Normal rate and regular rhythm.  Pulmonary:     Effort: Pulmonary effort is normal.     Breath sounds: Normal  breath sounds.  Abdominal:     General: Bowel sounds are normal.     Palpations: Abdomen is soft. There is no mass.     Tenderness: There is no abdominal tenderness.  Musculoskeletal:     Cervical back: Neck supple.  Lymphadenopathy:     Cervical: No cervical adenopathy.  Skin:    General: Skin is warm and dry.  Neurological:     Mental Status: She is alert and oriented to person, place, and time.  Psychiatric:        Behavior: Behavior normal.    Results for orders placed or performed during the hospital encounter of 03/25/21  TSH  Result Value Ref Range   TSH 16.747 (H) 0.350 - 4.500 uIU/mL  Lipid panel  Result Value Ref Range   Cholesterol 183 0 - 200 mg/dL   Triglycerides 124 <150 mg/dL   HDL 43 >40 mg/dL   Total CHOL/HDL Ratio 4.3 RATIO   VLDL 25 0 - 40 mg/dL   LDL Cholesterol 115 (H) 0 - 99 mg/dL   A1c- 6.7     Assessment & Plan:   Encounter Diagnoses  Name Primary?   Dentalgia Yes    Dental decay    Controlled diabetes mellitus type 2 with complications, unspecified whether long term insulin use (HCC)    Hypothyroidism, unspecified type    Essential hypertension    Stage 3a chronic kidney disease ([place])    Hyperlipidemia, unspecified hyperlipidemia type      -reviewed labs with pt -will try to Get to dental on accelerated basis in light of it affecting her allover health -No changes to meds -Pt has appointment for DM eye exam in May -pt to follow up here 3 months.  She is to contact office for any changes or concerns

## 2021-04-06 ENCOUNTER — Other Ambulatory Visit: Payer: Self-pay | Admitting: Physician Assistant

## 2021-04-13 ENCOUNTER — Other Ambulatory Visit: Payer: Self-pay | Admitting: Nephrology

## 2021-04-13 ENCOUNTER — Other Ambulatory Visit (HOSPITAL_COMMUNITY): Payer: Self-pay | Admitting: Nephrology

## 2021-04-13 DIAGNOSIS — E1122 Type 2 diabetes mellitus with diabetic chronic kidney disease: Secondary | ICD-10-CM

## 2021-04-13 DIAGNOSIS — D631 Anemia in chronic kidney disease: Secondary | ICD-10-CM

## 2021-04-13 DIAGNOSIS — E1129 Type 2 diabetes mellitus with other diabetic kidney complication: Secondary | ICD-10-CM

## 2021-04-13 DIAGNOSIS — R809 Proteinuria, unspecified: Secondary | ICD-10-CM

## 2021-04-17 ENCOUNTER — Other Ambulatory Visit: Payer: Self-pay

## 2021-04-17 ENCOUNTER — Ambulatory Visit (INDEPENDENT_AMBULATORY_CARE_PROVIDER_SITE_OTHER): Payer: Self-pay | Admitting: Orthopedic Surgery

## 2021-04-17 ENCOUNTER — Encounter: Payer: Self-pay | Admitting: Orthopedic Surgery

## 2021-04-17 VITALS — BP 147/88 | HR 77 | Ht 67.0 in | Wt 170.0 lb

## 2021-04-17 DIAGNOSIS — M65321 Trigger finger, right index finger: Secondary | ICD-10-CM

## 2021-04-17 DIAGNOSIS — M65332 Trigger finger, left middle finger: Secondary | ICD-10-CM

## 2021-04-17 DIAGNOSIS — M65331 Trigger finger, right middle finger: Secondary | ICD-10-CM

## 2021-04-17 DIAGNOSIS — G5602 Carpal tunnel syndrome, left upper limb: Secondary | ICD-10-CM

## 2021-04-17 DIAGNOSIS — G5601 Carpal tunnel syndrome, right upper limb: Secondary | ICD-10-CM

## 2021-04-17 NOTE — Progress Notes (Signed)
Orthopaedic Postop Note  Assessment: Sandra Parsons is a 51 y.o. female s/p left long finger trigger release (DOS: 09/01/2020); now s/p R CTR and R index and long finger trigger release.  Right rotator cuff tendonitis  DOS: 12/08/2020  Plan: Right hand numbness and tingling almost completely resolved.  Still has some mild numbness in index finger.  No triggering of index and long fingers.  Right shoulder doing better with topical pain patches.  Pain is intermittent.  Not interested in an injection.   Left hand with some numbness, tingling and pain in the median nerve distribution.  Symptoms mild overall.  Recommend night time splint.  Also with some residual tightness of the long finger.  No triggering.  On physical exam, some scar tissue and loss of full extension at her MCP.  Recommended stretching the long finger.  She stated her understanding.  Nodule on her 2nd MCP most likely a cyst associated with some pathology at the joint.  Will continue to monitor.  Return precautions discussed.   Follow up as needed.   Follow-up: Return if symptoms worsen or fail to improve. XR at next visit: None  Subjective:  Chief Complaint  Patient presents with   Hand Pain    Lt hand pain/numbness for 2 wks. Also has a painful nodule that is getting bigger and more painful for 2 months.     History of Present Illness: Sandra Parsons is a 51 y.o. female who returns to clinic today for follow-up of multiple complaints.  She has had procedures completed on both hands.  Most recently, she has had a right carpal tunnel release, as well as trigger finger releases.  She is doing very well.  No issues with her incisions.  She reports that the sensation is almost completely returned in her right hand.  Her right shoulder is doing fairly well, using topical ointments and patches for pain.  Pain is intermittent.  She still has good range of motion.  Over the past 2 weeks, she started to develop some numbness and  tingling in the median nerve distribution in the left hand.  This gets worse at night.  No prior treatment.  She also notes some stiffness in the left long finger.  She has noticed some scar tissue buildup following her recent surgery.   Review of Systems: No fevers or chills Numbness to the index and long fingers.  No Chest Pain No shortness of breath   Objective: BP (!) 147/88    Pulse 77    Ht 5\' 7"  (1.702 m)    Wt 170 lb (77.1 kg)    LMP 04/26/2016    BMI 26.63 kg/m   Physical Exam:  Alert and oriented, no acute distress  Evaluation of the right hand demonstrates well-healed surgical incisions.  No tenderness to palpation at the A1 pulley to the long and ring fingers.  Slightly decreased sensation to the index finger.  Strength is 4+/5 grip strength.  Evaluation left hand demonstrates mild Tinel's at the carpal tunnel.  Sensations intact currently.  Good muscle bulk at the thenar eminence.  Scar tissue buildup of the A1 pulley to the long finger.  Slight extensor lag as a result.  Small cyst forming on the dorsal aspect of the second MCP.  Mild tenderness to palpation in this area.  She is able to make a full fist.  IMAGING: I personally ordered and reviewed the following images:  No new imaging obtained today  Mordecai Rasmussen, MD 04/17/2021  9:46 PM

## 2021-04-17 NOTE — Patient Instructions (Signed)
Wear brace on the left wrist at night time only  Stretch the long finger on the left hand.  Scar tissue is restricting motion, and ability to get to full extension.

## 2021-04-21 ENCOUNTER — Other Ambulatory Visit: Payer: Self-pay

## 2021-04-21 ENCOUNTER — Ambulatory Visit (HOSPITAL_COMMUNITY)
Admission: RE | Admit: 2021-04-21 | Discharge: 2021-04-21 | Disposition: A | Discharge status: Home / Self Care | Payer: Self-pay | Source: Ambulatory Visit | Attending: Nephrology | Admitting: Nephrology

## 2021-04-21 DIAGNOSIS — N189 Chronic kidney disease, unspecified: Secondary | ICD-10-CM | POA: Insufficient documentation

## 2021-04-21 DIAGNOSIS — D631 Anemia in chronic kidney disease: Secondary | ICD-10-CM

## 2021-04-21 DIAGNOSIS — R809 Proteinuria, unspecified: Secondary | ICD-10-CM | POA: Insufficient documentation

## 2021-04-21 DIAGNOSIS — E1122 Type 2 diabetes mellitus with diabetic chronic kidney disease: Secondary | ICD-10-CM

## 2021-04-21 DIAGNOSIS — E1129 Type 2 diabetes mellitus with other diabetic kidney complication: Secondary | ICD-10-CM

## 2021-04-30 ENCOUNTER — Telehealth: Payer: Self-pay

## 2021-04-30 NOTE — Telephone Encounter (Signed)
Called pt to inform she needs to come sign renewal application for insulin, vm full unable to leave message.

## 2021-05-21 ENCOUNTER — Emergency Department (HOSPITAL_COMMUNITY)
Admission: EM | Admit: 2021-05-21 | Discharge: 2021-05-21 | Disposition: A | Discharge status: Home / Self Care | Payer: Self-pay | Attending: Emergency Medicine | Admitting: Emergency Medicine

## 2021-05-21 ENCOUNTER — Encounter (HOSPITAL_COMMUNITY): Payer: Self-pay

## 2021-05-21 ENCOUNTER — Telehealth: Payer: Self-pay

## 2021-05-21 ENCOUNTER — Other Ambulatory Visit: Payer: Self-pay

## 2021-05-21 DIAGNOSIS — S61011A Laceration without foreign body of right thumb without damage to nail, initial encounter: Secondary | ICD-10-CM | POA: Insufficient documentation

## 2021-05-21 DIAGNOSIS — Z5321 Procedure and treatment not carried out due to patient leaving prior to being seen by health care provider: Secondary | ICD-10-CM | POA: Insufficient documentation

## 2021-05-21 DIAGNOSIS — Y93E5 Activity, floor mopping and cleaning: Secondary | ICD-10-CM | POA: Insufficient documentation

## 2021-05-21 DIAGNOSIS — W311XXA Contact with metalworking machines, initial encounter: Secondary | ICD-10-CM | POA: Insufficient documentation

## 2021-05-21 NOTE — Telephone Encounter (Signed)
CALLED PT TO INFORM PROOF OF INCOME NEEDED FOR INSULIN APPLICATION RENEWAL, LEFT VM TO CALL BACK ?

## 2021-05-21 NOTE — Telephone Encounter (Addendum)
Spoke with pt & informed 1 box of insulin is here for pickup & informed proof of income is needed in order to refill insulin, adivsed pt that I will use pay stubs sent for cafa & send for proof of income on her behalf, informed if there are any issues I will let her know. Pt voiced understanding. ?

## 2021-05-21 NOTE — ED Triage Notes (Signed)
Patient reports was cleaning and hit a piece of metal with right hand has lac to right thumb,.  ?

## 2021-05-26 ENCOUNTER — Telehealth: Payer: Self-pay

## 2021-05-26 NOTE — Telephone Encounter (Signed)
Called to follow up with client of [facility] who had a recent ER visit on 05/21/21. She states she hurt her hand at "2 hours" and left since she had not been seen. ?She has been keeping it clean and covered with "new skin" States she felt "it needed a couple stitches".  ?Client is a diabetic , she state she is "keeping an eye on it for possible infection" client's next appointment with her provider is 06/29/21. ?Encouraged client to call [facility] and let provider know what happened to determine if she needs to be seen and checked sooner to monitor and treat if needed. Client is agreeable and states she will call when she can as she is at work currently. ? ?Francee Nodal RN ?[employer] ?

## 2021-05-28 ENCOUNTER — Telehealth: Payer: Self-pay

## 2021-05-28 NOTE — Telephone Encounter (Signed)
Informed pt insulin delivered. ?

## 2021-06-01 NOTE — Progress Notes (Signed)
Referring Provider: Jacquelin Hawking, PA-C Primary Care Physician:  Jacquelin Hawking, PA-C Primary GI Physician: Dr. Marletta Lor  Chief Complaint  Patient presents with   positive cologuard    HPI:   Sandra Parsons is a 51 y.o. female presenting today at the request of Jacquelin Hawking, PA-C for positive fit test.  Referral was placed in October 2022.  Per lab review, she had positive FOBT on 12/17/2020.   Per chart review, she has had anemia in the 9-10 range since June 2022.  This was down from 14.3 in 2018. Iron panel low normal January 2023. Vitamin B12 481. She also has chronic kidney disease.  I last saw patient in September 2021 for nausea and vomiting and positive hepatitis C antibody.  She reported morning nausea with vomiting about 3 days a week for several years.  Denied daytime symptoms, GERD symptoms, and felt nausea and vomiting correlated with eating late in the evening.  She also reported taking Aleve twice daily for arthritis and a pinched nerve in her neck.  Stated that she took a little extra, she would also have some abdominal pain.  She is trying to cut back on NSAIDs and was using over-the-counter pain patches.  Denied hematemesis, hematochezia, melena.  Regarding hepatitis C, risk factors include history of intranasal drug use, imprisonment, and tattoos.  Queried gastroparesis in the setting of diabetes and possible atypical GERD as etiology of upper GI symptoms.  May also have gastritis in the setting of NSAIDs.  Recommended dietary/lifestyle adjustments, avoiding NSAIDs, and requested she let us know if nausea/vomiting did not resolve.  Also planned to check hepatitis C RNA.  Hepatitis C RNA was not detected. Follow-up RUQ ultrasound with cholelithiasis without cholecystitis, hepatic steatosis.  Fatty liver recommendations provided.  Patient had an appointment in December 2021, but canceled.  Today:  Positive fit test:  Reports having a couple episodes of toilet  tissue hematochezia when passing harder stools after having hand surgery last year.  States she developed some constipation after anesthesia.  This has since resolved and she has not had any toilet tissue hematochezia in several months.  Bowels are moving very well.  Denies melena, abdominal pain, unintentional weight loss.  No family history of colon cancer.  Reports she had a colonoscopy around age 7 due to having some abdominal pain.  States she had a normal exam.  Stopped Aleve unless she absolutely has to, but this is very rare.   N/V:  Resolved with eating better and not after 5 pm. No GERD symptoms or dysphagia.   No menstrual cycle in over 2 years. No hematuria, nose bleeds.   No iron.  Past Medical History:  Diagnosis Date   CKD (chronic kidney disease)    Complication of anesthesia    Depression    Diabetes mellitus without complication (HCC)    Hepatitis B    per patient- diagnosed at age 69   Hepatitis C    Hyperlipidemia    Hypertension    Not currently on BP meds   Pinched nerve    PONV (postoperative nausea and vomiting)    Thyroid disease     Past Surgical History:  Procedure Laterality Date   BACK SURGERY     x2   CARPAL TUNNEL RELEASE Right 12/08/2020   Procedure: CARPAL TUNNEL RELEASE;  Surgeon: [name], MD;  Location: [facility];  Service: Orthopedics;  Laterality: Right;   CYSTECTOMY     L forearm   TRIGGER FINGER  RELEASE Left 09/01/2020   Procedure: LEFT LONG FINGER  A-1 PULLEY RELEASE;  Surgeon: [name], MD;  Location: [facility];  Service: Orthopedics;  Laterality: Left;  Left long finger trigger release   TRIGGER FINGER RELEASE Right 12/08/2020   Procedure: RELEASE TRIGGER FINGER/A-1 PULLEY;  Surgeon: [name], MD;  Location: [facility];  Service: Orthopedics;  Laterality: Right;  Right index and long finger trigger release    Current Outpatient Medications  Medication Sig Dispense Refill   atorvastatin (LIPITOR) 20 MG tablet TAKE 1 Tablet BY  MOUTH ONCE EVERY DAY 90 tablet 1   gabapentin (NEURONTIN) 100 MG capsule Take 1 capsule (100 mg total) by mouth 2 (two) times daily as needed. 60 capsule 0   insulin glargine (LANTUS SOLOSTAR) 100 UNIT/ML Solostar Pen Inject 25 Units into the skin daily. 15 mL 0   JANUMET 50-500 MG tablet TAKE 1 Tablet  BY MOUTH TWICE DAILY WITH A MEAL 180 tablet 1   levothyroxine (SYNTHROID) 50 MCG tablet TAKE 1 Tablet BY MOUTH ONCE EVERY DAY 90 tablet 1   Lidocaine 4 % PTCH Apply 1 patch topically daily as needed (pain (associated with pinch nerve pain when working)).     lisinopril (ZESTRIL) 20 MG tablet Take 1 tablet by mouth once daily 30 tablet 4   triamcinolone (KENALOG) 0.025 % ointment Apply 1 application topically at bedtime as needed. 15 g 1   naproxen sodium (ALEVE) 220 MG tablet Take 220 mg by mouth 2 (two) times daily with a meal. (Patient not taking: Reported on 06/03/2021)     No current facility-administered medications for this visit.    Allergies as of 06/03/2021 - Review Complete 06/03/2021  Allergen Reaction Noted   Prednisone Other (See Comments) 05/06/2015    Family History  Problem Relation Age of Onset   Cancer Mother        multiple myeloma   Diabetes Mother    Heart failure Father    Diabetes Maternal Grandmother    Colon cancer Neg Hx     Social History   Socioeconomic History   Marital status: Single    Spouse name: Not on file   Number of children: Not on file   Years of education: Not on file   Highest education level: Not on file  Occupational History   Not on file  Tobacco Use   Smoking status: Former    Packs/day: 0.25    Years: 31.00    Pack years: 7.75    Types: Cigarettes    Quit date: 06/21/2018    Years since quitting: 2.9   Smokeless tobacco: Never  Vaping Use   Vaping Use: Never used  Substance and Sexual Activity   Alcohol use: No   Drug use: Yes    Types: Marijuana    Comment: once a day; history of methamphetamine and cocaine use with last  use about 4 years ago-documented 12/03/2019   Sexual activity: Not on file  Other Topics Concern   Not on file  Social History Narrative   Not on file   Social Determinants of Health   Financial Resource Strain: Not on file  Food Insecurity: Not on file  Transportation Needs: Not on file  Physical Activity: Not on file  Stress: Not on file  Social Connections: Not on file    Review of Systems: Gen: Denies fever, chills, cold or flulike symptoms, presyncope, syncope. CV: Denies chest pain, palpitations. Resp: Denies dyspnea or cough. GI: See HPI Heme:  See HPI  Physical Exam: BP 110/68   Pulse 78   Temp 97.7 F (36.5 C) (Temporal)   Ht 5\' 7"  (1.702 m)   Wt 171 lb 3.2 oz (77.7 kg)   LMP 04/26/2016   BMI 26.81 kg/m  General:   Alert and oriented. No distress noted. Pleasant and cooperative.  Head:  Normocephalic and atraumatic. Mouth:  Oral mucosa pink and moist. Good dentition. No lesions. Heart:  S1, S2 present without murmurs appreciated. Lungs:  Clear to auscultation bilaterally. No wheezes, rales, or rhonchi. No distress.  Abdomen:  +BS, soft, non-tender and non-distended. No rebound or guarding. No HSM or masses noted. Msk:  Symmetrical without gross deformities. Normal posture. Extremities:  Without edema. Neurologic:  Alert and  oriented x4 Psych:  Normal mood and affect.     Assessment: 51 year old female with history of diabetes, hypothyroidism, HTN, HLD, prior hep C infection s/p spontaneous clearance with RNA not detected in September 2021, presenting today at the request of Jacquelin Hawking, PA-C due to positive fecal occult blood test.  Heme positive stool/Anemia:  Fecal occult blood test + 12/17/2020.  Patient reports she was having intermittent toilet tissue hematochezia in the setting of constipation at that time s/p recent hand surgery.  Constipation has since resolved and she has not noticed any rectal bleeding in several months.  Denies melena,  abdominal pain, unintentional weight loss.  No Fhx of colon cancer. Reports colonoscopy at age 33 due to abdominal pain with normal exam.  Per chart review, I do note she has anemia with hemoglobin in the 9-10 range since June 2022.  This is down from 14 range in 2018.  Iron panel low normal January 2023, vitamin B12 within normal limits.  Notably, she also has chronic kidney disease which could also be contributing to her anemia. No significant upper GI symptoms.   Recommended updating CBC and iron panel.  If she has iron deficiency anemia, she will need EGD and colonoscopy for further evaluation.  If her iron panel is within normal limits, she will need to proceed with colonoscopy only to further evaluate heme positive stool.  Differentials for heme positive stool include benign anorectal source such as hemorrhoids, but cannot rule out colon polyps or malignancy.   Nausea/Vomiting:  Previously reported intermittent morning nausea and vomiting when I saw her in September 2021.  This has resolved with eating healthier and not eating after 5 PM.    Plan: CBC, iron panel with ferritin. If iron panel within normal limits, we will proceed with colonoscopy only.  If evidence of iron deficiency, will proceed with EGD and colonoscopy with propofol with Dr. Marletta Lor. The risks, benefits, and alternatives have been discussed with the patient in detail. The patient states understanding and desires to proceed. ASA 2 Will provide separate instructions for diabetes medication adjustments. Follow-up date to be determined.   Ermalinda Memos, PA-C [facility] 06/03/2021

## 2021-06-03 ENCOUNTER — Other Ambulatory Visit: Payer: Self-pay

## 2021-06-03 ENCOUNTER — Encounter: Payer: Self-pay | Admitting: Gastroenterology

## 2021-06-03 ENCOUNTER — Other Ambulatory Visit (HOSPITAL_COMMUNITY)
Admission: RE | Admit: 2021-06-03 | Discharge: 2021-06-03 | Disposition: A | Discharge status: Home / Self Care | Payer: Self-pay | Source: Ambulatory Visit | Attending: Gastroenterology | Admitting: Gastroenterology

## 2021-06-03 ENCOUNTER — Ambulatory Visit (INDEPENDENT_AMBULATORY_CARE_PROVIDER_SITE_OTHER): Payer: Self-pay | Admitting: Gastroenterology

## 2021-06-03 VITALS — BP 110/68 | HR 78 | Temp 97.7°F | Ht 67.0 in | Wt 171.2 lb

## 2021-06-03 DIAGNOSIS — R195 Other fecal abnormalities: Secondary | ICD-10-CM | POA: Insufficient documentation

## 2021-06-03 DIAGNOSIS — D649 Anemia, unspecified: Secondary | ICD-10-CM

## 2021-06-03 LAB — CBC WITH DIFFERENTIAL/PLATELET
Abs Immature Granulocytes: 0.01 10*3/uL (ref 0.00–0.07)
Basophils Absolute: 0.1 10*3/uL (ref 0.0–0.1)
Basophils Relative: 2 %
Eosinophils Absolute: 0.8 10*3/uL — ABNORMAL HIGH (ref 0.0–0.5)
Eosinophils Relative: 13 %
HCT: 29.3 % — ABNORMAL LOW (ref 36.0–46.0)
Hemoglobin: 9.7 g/dL — ABNORMAL LOW (ref 12.0–15.0)
Immature Granulocytes: 0 %
Lymphocytes Relative: 32 %
Lymphs Abs: 2 10*3/uL (ref 0.7–4.0)
MCH: 32.2 pg (ref 26.0–34.0)
MCHC: 33.1 g/dL (ref 30.0–36.0)
MCV: 97.3 fL (ref 80.0–100.0)
Monocytes Absolute: 0.6 10*3/uL (ref 0.1–1.0)
Monocytes Relative: 10 %
Neutro Abs: 2.8 10*3/uL (ref 1.7–7.7)
Neutrophils Relative %: 43 %
Platelets: 241 10*3/uL (ref 150–400)
RBC: 3.01 MIL/uL — ABNORMAL LOW (ref 3.87–5.11)
RDW: 11.9 % (ref 11.5–15.5)
WBC: 6.3 10*3/uL (ref 4.0–10.5)
nRBC: 0 % (ref 0.0–0.2)

## 2021-06-03 LAB — IRON AND TIBC
Iron: 58 ug/dL (ref 28–170)
Saturation Ratios: 19 % (ref 10.4–31.8)
TIBC: 300 ug/dL (ref 250–450)
UIBC: 242 ug/dL

## 2021-06-03 LAB — FERRITIN: Ferritin: 17 ng/mL (ref 11–307)

## 2021-06-03 NOTE — Patient Instructions (Signed)
Please have blood work completed at [facility]. ? ?I will call you with results and further recommendations. ? ?It was good to see you again today!  ? ?Ermalinda Memos, PA-C ?[facility] ? ?

## 2021-06-05 ENCOUNTER — Telehealth: Payer: Self-pay | Admitting: *Deleted

## 2021-06-05 NOTE — Telephone Encounter (Signed)
Pt needs TCS with propofol, Dr. Abbey Chatters asa 2, needs UPT. ? ?Called to schedule. She states she needs a bill with an estimate for procedure to turn into [employer] to get coverage for this. Will get with my Geneticist, molecular and call pt back ?

## 2021-06-05 NOTE — Telephone Encounter (Signed)
Sandra Parsons was able to get estimate for just the TCS charge. Patient aware and it has been mailed to her. She is aware once submitted and approved, call back to schedule procedure. ?

## 2021-06-08 ENCOUNTER — Other Ambulatory Visit: Payer: Self-pay | Admitting: Physician Assistant

## 2021-06-08 DIAGNOSIS — N1831 Chronic kidney disease, stage 3a: Secondary | ICD-10-CM

## 2021-06-08 DIAGNOSIS — E039 Hypothyroidism, unspecified: Secondary | ICD-10-CM

## 2021-06-08 DIAGNOSIS — I10 Essential (primary) hypertension: Secondary | ICD-10-CM

## 2021-06-08 DIAGNOSIS — E785 Hyperlipidemia, unspecified: Secondary | ICD-10-CM

## 2021-06-08 DIAGNOSIS — E118 Type 2 diabetes mellitus with unspecified complications: Secondary | ICD-10-CM

## 2021-06-11 NOTE — Telephone Encounter (Signed)
Mindy, can you call patient early next week to follow-up on this?  ?

## 2021-06-15 NOTE — Telephone Encounter (Signed)
Called pt, no answer and not able to leave VM 

## 2021-06-15 NOTE — Telephone Encounter (Signed)
Noted  

## 2021-06-15 NOTE — Telephone Encounter (Signed)
Spoke with pt and she is still working on this. She states she will call us when it's final and can schedule ?

## 2021-06-23 ENCOUNTER — Other Ambulatory Visit (HOSPITAL_COMMUNITY)
Admission: RE | Admit: 2021-06-23 | Discharge: 2021-06-23 | Disposition: A | Discharge status: Home / Self Care | Payer: Self-pay | Source: Ambulatory Visit | Attending: Physician Assistant | Admitting: Physician Assistant

## 2021-06-23 DIAGNOSIS — E118 Type 2 diabetes mellitus with unspecified complications: Secondary | ICD-10-CM

## 2021-06-23 DIAGNOSIS — E039 Hypothyroidism, unspecified: Secondary | ICD-10-CM

## 2021-06-23 DIAGNOSIS — I10 Essential (primary) hypertension: Secondary | ICD-10-CM

## 2021-06-23 DIAGNOSIS — E785 Hyperlipidemia, unspecified: Secondary | ICD-10-CM

## 2021-06-23 DIAGNOSIS — N1831 Chronic kidney disease, stage 3a: Secondary | ICD-10-CM

## 2021-06-23 LAB — COMPREHENSIVE METABOLIC PANEL
ALT: 24 U/L (ref 0–44)
AST: 28 U/L (ref 15–41)
Albumin: 4.2 g/dL (ref 3.5–5.0)
Alkaline Phosphatase: 67 U/L (ref 38–126)
Anion gap: 8 (ref 5–15)
BUN: 53 mg/dL — ABNORMAL HIGH (ref 6–20)
CO2: 21 mmol/L — ABNORMAL LOW (ref 22–32)
Calcium: 9.2 mg/dL (ref 8.9–10.3)
Chloride: 109 mmol/L (ref 98–111)
Creatinine, Ser: 1.49 mg/dL — ABNORMAL HIGH (ref 0.44–1.00)
GFR, Estimated: 43 mL/min — ABNORMAL LOW (ref >60)
Glucose, Bld: 113 mg/dL — ABNORMAL HIGH (ref 70–99)
Potassium: 5 mmol/L (ref 3.5–5.1)
Sodium: 138 mmol/L (ref 135–145)
Total Bilirubin: 0.5 mg/dL (ref 0.3–1.2)
Total Protein: 8.2 g/dL — ABNORMAL HIGH (ref 6.5–8.1)

## 2021-06-23 LAB — LIPID PANEL
Cholesterol: 148 mg/dL (ref 0–200)
HDL: 46 mg/dL (ref >40)
LDL Cholesterol: 85 mg/dL (ref 0–99)
Total CHOL/HDL Ratio: 3.2 RATIO
Triglycerides: 84 mg/dL (ref <150)
VLDL: 17 mg/dL (ref 0–40)

## 2021-06-23 LAB — TSH: TSH: 5.645 u[IU]/mL — ABNORMAL HIGH (ref 0.350–4.500)

## 2021-06-24 LAB — HEMOGLOBIN A1C
Hgb A1c MFr Bld: 6.2 % — ABNORMAL HIGH (ref 4.8–5.6)
Mean Plasma Glucose: 131 mg/dL

## 2021-06-29 ENCOUNTER — Encounter: Payer: Self-pay | Admitting: Physician Assistant

## 2021-06-29 ENCOUNTER — Ambulatory Visit: Payer: Self-pay | Admitting: Physician Assistant

## 2021-06-29 VITALS — BP 155/75 | HR 77 | Temp 97.1°F | Wt 172.5 lb

## 2021-06-29 DIAGNOSIS — I10 Essential (primary) hypertension: Secondary | ICD-10-CM

## 2021-06-29 DIAGNOSIS — N1832 Chronic kidney disease, stage 3b: Secondary | ICD-10-CM

## 2021-06-29 DIAGNOSIS — E118 Type 2 diabetes mellitus with unspecified complications: Secondary | ICD-10-CM

## 2021-06-29 DIAGNOSIS — E785 Hyperlipidemia, unspecified: Secondary | ICD-10-CM

## 2021-06-29 DIAGNOSIS — E039 Hypothyroidism, unspecified: Secondary | ICD-10-CM

## 2021-06-29 DIAGNOSIS — D631 Anemia in chronic kidney disease: Secondary | ICD-10-CM

## 2021-06-29 NOTE — Patient Instructions (Addendum)
Invokana is available through [facility] ?

## 2021-06-29 NOTE — Progress Notes (Signed)
? ?BP (!) 155/75   Pulse 77   Temp (!) 97.1 ?F (36.2 ?C)   Wt 172 lb 8 oz (78.2 kg)   LMP 04/26/2016   SpO2 98%   BMI 27.02 kg/m?   ? ?Subjective:  ? ? Patient ID: [name], female    DOB: [birth year], 51 y.o.   MRN: [id number] ? ?HPI: ?Sandra Parsons is a 51 y.o. female presenting on 06/29/2021 for Diabetes, Hyperlipidemia, Hypertension, and Hypothyroidism ? ? ?HPI ? ? ?Chief Complaint  ?Patient presents with  ? Diabetes  ? Hyperlipidemia  ? Hypertension  ? Hypothyroidism  ? ? ?Pt says she is doing well.  She is still working at [employer].  She says she is stressed due to nephew and his mom in [facility] and she is drug addict and relapsed.   Also she is stressed because today is her 3rd day in a row off work. ? ? ?She went to nephrologist in February.  He rx farxiga that she didn't take due to cost.   It is not available through [facility] but invokana is.  ? ?BP at GI 110/68 on 06/03/21, 140/70 at nephrologist on 04/23/21 ? ?She says she is supposed to be getting a colonoscopy when her cafa gets approved for work-up of anemia.  Her iron studies were WNL and she no longer has menses.   ? ? ? ? ?Relevant past medical, surgical, family and social history reviewed and updated as indicated. Interim medical history since our last visit reviewed. ?Allergies and medications reviewed and updated. ? ? ? ?Current Outpatient Medications:  ?  atorvastatin (LIPITOR) 20 MG tablet, TAKE 1 Tablet BY MOUTH ONCE EVERY DAY, Disp: 90 tablet, Rfl: 1 ?  insulin glargine (LANTUS SOLOSTAR) 100 UNIT/ML Solostar Pen, Inject 25 Units into the skin daily., Disp: 15 mL, Rfl: 0 ?  JANUMET 50-500 MG tablet, TAKE 1 Tablet  BY MOUTH TWICE DAILY WITH A MEAL, Disp: 180 tablet, Rfl: 1 ?  levothyroxine (SYNTHROID) 50 MCG tablet, TAKE 1 Tablet BY MOUTH ONCE EVERY DAY, Disp: 90 tablet, Rfl: 1 ?  Lidocaine 4 % PTCH, Apply 1 patch topically daily as needed (pain (associated with pinch nerve pain when working))., Disp: , Rfl:  ?  lisinopril (ZESTRIL) 20 MG  tablet, Take 1 tablet by mouth once daily (Patient taking differently: 30 mg.), Disp: 30 tablet, Rfl: 4 ?  gabapentin (NEURONTIN) 100 MG capsule, Take 1 capsule (100 mg total) by mouth 2 (two) times daily as needed. (Patient not taking: Reported on 06/29/2021), Disp: 60 capsule, Rfl: 0 ?  naproxen sodium (ALEVE) 220 MG tablet, Take 220 mg by mouth 2 (two) times daily with a meal. (Patient not taking: Reported on 06/03/2021), Disp: , Rfl:  ?  triamcinolone (KENALOG) 0.025 % ointment, Apply 1 application topically at bedtime as needed. (Patient not taking: Reported on 06/29/2021), Disp: 15 g, Rfl: 1 ? ? ? ?Review of Systems ? ?Per HPI unless specifically indicated above ? ?   ?Objective:  ?  ?BP (!) 155/75   Pulse 77   Temp (!) 97.1 ?F (36.2 ?C)   Wt 172 lb 8 oz (78.2 kg)   LMP 04/26/2016   SpO2 98%   BMI 27.02 kg/m?   ?Wt Readings from Last 3 Encounters:  ?06/29/21 172 lb 8 oz (78.2 kg)  ?06/03/21 171 lb 3.2 oz (77.7 kg)  ?05/21/21 170 lb (77.1 kg)  ?  ?Physical Exam ?Vitals reviewed.  ?Constitutional:   ?   General: She is not in  acute distress. ?   Appearance: She is well-developed. She is not toxic-appearing.  ?HENT:  ?   Head: Normocephalic and atraumatic.  ?Cardiovascular:  ?   Rate and Rhythm: Normal rate and regular rhythm.  ?Pulmonary:  ?   Effort: Pulmonary effort is normal.  ?   Breath sounds: Normal breath sounds.  ?Abdominal:  ?   General: Bowel sounds are normal.  ?   Palpations: Abdomen is soft. There is no mass.  ?   Tenderness: There is no abdominal tenderness.  ?Musculoskeletal:  ?   Cervical back: Neck supple.  ?   Right lower leg: No edema.  ?   Left lower leg: No edema.  ?Lymphadenopathy:  ?   Cervical: No cervical adenopathy.  ?Skin: ?   General: Skin is warm and dry.  ?Neurological:  ?   Mental Status: She is alert and oriented to person, place, and time.  ?Psychiatric:     ?   Behavior: Behavior normal.  ? ? ?Results for orders placed or performed during the hospital encounter of 06/23/21   ?TSH  ?Result Value Ref Range  ? TSH 5.645 (H) 0.350 - 4.500 uIU/mL  ?Lipid panel  ?Result Value Ref Range  ? Cholesterol 148 0 - 200 mg/dL  ? Triglycerides 84 <150 mg/dL  ? HDL 46 >40 mg/dL  ? Total CHOL/HDL Ratio 3.2 RATIO  ? VLDL 17 0 - 40 mg/dL  ? LDL Cholesterol 85 0 - 99 mg/dL  ?Comprehensive metabolic panel  ?Result Value Ref Range  ? Sodium 138 135 - 145 mmol/L  ? Potassium 5.0 3.5 - 5.1 mmol/L  ? Chloride 109 98 - 111 mmol/L  ? CO2 21 (L) 22 - 32 mmol/L  ? Glucose, Bld 113 (H) 70 - 99 mg/dL  ? BUN 53 (H) 6 - 20 mg/dL  ? Creatinine, Ser 1.49 (H) 0.44 - 1.00 mg/dL  ? Calcium 9.2 8.9 - 10.3 mg/dL  ? Total Protein 8.2 (H) 6.5 - 8.1 g/dL  ? Albumin 4.2 3.5 - 5.0 g/dL  ? AST 28 15 - 41 U/L  ? ALT 24 0 - 44 U/L  ? Alkaline Phosphatase 67 38 - 126 U/L  ? Total Bilirubin 0.5 0.3 - 1.2 mg/dL  ? GFR, Estimated 43 (L) >60 mL/min  ? Anion gap 8 5 - 15  ?Hemoglobin A1c  ?Result Value Ref Range  ? Hgb A1c MFr Bld 6.2 (H) 4.8 - 5.6 %  ? Mean Plasma Glucose 131 mg/dL  ? ?   ?Assessment & Plan:  ? ?Encounter Diagnoses  ?Name Primary?  ? Essential hypertension Yes  ? Controlled diabetes mellitus type 2 with complications, unspecified whether long term insulin use ([place])   ? Stage 3b chronic kidney disease ([place])   ? Hyperlipidemia, unspecified hyperlipidemia type   ? Hypothyroidism, unspecified type   ? Anemia due to chronic kidney disease, unspecified CKD stage   ? ? ? ?-reviewed labs with pt ?-Invokana  is available through [facility]   since she hasn't access to affordable farxiga.  Will not start rx due to renal function.  Encouraged pt to discuss it with nephrologist at her appointment 07/23/21 ?-pt to Continue with GI and nephrology per their recommendations ?-she has DM eye exam in May ?-pt to follow up 4/25- 930- bp check.  She is to contact office sooner prn ? ? ? ?

## 2021-07-14 ENCOUNTER — Ambulatory Visit: Payer: Self-pay | Admitting: Physician Assistant

## 2021-07-14 ENCOUNTER — Encounter: Payer: Self-pay | Admitting: Physician Assistant

## 2021-07-14 VITALS — BP 136/74 | HR 76 | Temp 97.1°F | Wt 167.0 lb

## 2021-07-14 DIAGNOSIS — I10 Essential (primary) hypertension: Secondary | ICD-10-CM

## 2021-07-14 NOTE — Progress Notes (Signed)
? ?BP 136/74   Pulse 76   Temp (!) 97.1 ?F (36.2 ?C)   Wt 167 lb (75.8 kg)   LMP 04/26/2016   SpO2 98%   BMI 26.16 kg/m?   ? ?Subjective:  ? ? Patient ID: Sandra Parsons, female    DOB: 1970-07-24, 51 y.o.   MRN: [id number] ? ?HPI: ?Darletta Noblett is a 51 y.o. female presenting on 07/14/2021 for Follow-up (BP check) ? ? ?HPI ? ?Chief Complaint  ?Patient presents with  ? Follow-up  ?  BP check  ? ? ?Pt says she is doing well.  She has no complaints today.  ? ? ? ?Relevant past medical, surgical, family and social history reviewed and updated as indicated. Interim medical history since our last visit reviewed. ?Allergies and medications reviewed and updated. ? ? ? ?Current Outpatient Medications:  ?  atorvastatin (LIPITOR) 20 MG tablet, TAKE 1 Tablet BY MOUTH ONCE EVERY DAY, Disp: 90 tablet, Rfl: 1 ?  gabapentin (NEURONTIN) 100 MG capsule, Take 1 capsule (100 mg total) by mouth 2 (two) times daily as needed., Disp: 60 capsule, Rfl: 0 ?  insulin glargine (LANTUS SOLOSTAR) 100 UNIT/ML Solostar Pen, Inject 25 Units into the skin daily., Disp: 15 mL, Rfl: 0 ?  JANUMET 50-500 MG tablet, TAKE 1 Tablet  BY MOUTH TWICE DAILY WITH A MEAL, Disp: 180 tablet, Rfl: 1 ?  levothyroxine (SYNTHROID) 50 MCG tablet, TAKE 1 Tablet BY MOUTH ONCE EVERY DAY, Disp: 90 tablet, Rfl: 1 ?  Lidocaine 4 % PTCH, Apply 1 patch topically daily as needed (pain (associated with pinch nerve pain when working))., Disp: , Rfl:  ?  lisinopril (ZESTRIL) 20 MG tablet, Take 1 tablet by mouth once daily (Patient taking differently: 30 mg.), Disp: 30 tablet, Rfl: 4 ?  naproxen sodium (ALEVE) 220 MG tablet, Take 220 mg by mouth 2 (two) times daily with a meal., Disp: , Rfl:  ?  triamcinolone (KENALOG) 0.025 % ointment, Apply 1 application topically at bedtime as needed., Disp: 15 g, Rfl: 1  ? ?Review of Systems ? ?Per HPI unless specifically indicated above ? ?   ?Objective:  ?  ?BP 136/74   Pulse 76   Temp (!) 97.1 ?F (36.2 ?C)   Wt 167 lb (75.8 kg)    LMP 04/26/2016   SpO2 98%   BMI 26.16 kg/m?   ?Wt Readings from Last 3 Encounters:  ?07/14/21 167 lb (75.8 kg)  ?06/29/21 172 lb 8 oz (78.2 kg)  ?06/03/21 171 lb 3.2 oz (77.7 kg)  ?  ?Physical Exam ?Vitals reviewed.  ?Constitutional:   ?   General: She is not in acute distress. ?   Appearance: She is well-developed. She is not ill-appearing.  ?HENT:  ?   Head: Normocephalic and atraumatic.  ?Cardiovascular:  ?   Rate and Rhythm: Normal rate and regular rhythm.  ?Pulmonary:  ?   Effort: Pulmonary effort is normal.  ?   Breath sounds: Normal breath sounds.  ?Musculoskeletal:  ?   Cervical back: Neck supple.  ?   Right lower leg: No edema.  ?   Left lower leg: No edema.  ?Lymphadenopathy:  ?   Cervical: No cervical adenopathy.  ?Skin: ?   General: Skin is warm and dry.  ?Neurological:  ?   Mental Status: She is alert and oriented to person, place, and time.  ?Psychiatric:     ?   Behavior: Behavior normal.  ? ? ? ? ? ? ? ? ?   ?  Assessment & Plan:  ? ? ?Encounter Diagnosis  ?Name Primary?  ? Essential hypertension Yes  ? ? ? ?-BP is improved.   ?-Pt to continue current medications.  She is to follow up about 6 weeks.  She is to contact office sooner prn ?

## 2021-07-16 ENCOUNTER — Other Ambulatory Visit (HOSPITAL_COMMUNITY)
Admission: RE | Admit: 2021-07-16 | Discharge: 2021-07-16 | Disposition: A | Discharge status: Home / Self Care | Payer: Self-pay | Source: Ambulatory Visit | Attending: Nephrology | Admitting: Nephrology

## 2021-07-16 DIAGNOSIS — N189 Chronic kidney disease, unspecified: Secondary | ICD-10-CM | POA: Insufficient documentation

## 2021-07-16 LAB — CBC
HCT: 28.5 % — ABNORMAL LOW (ref 36.0–46.0)
Hemoglobin: 9.4 g/dL — ABNORMAL LOW (ref 12.0–15.0)
MCH: 31.9 pg (ref 26.0–34.0)
MCHC: 33 g/dL (ref 30.0–36.0)
MCV: 96.6 fL (ref 80.0–100.0)
Platelets: 210 10*3/uL (ref 150–400)
RBC: 2.95 MIL/uL — ABNORMAL LOW (ref 3.87–5.11)
RDW: 12.6 % (ref 11.5–15.5)
WBC: 8.2 10*3/uL (ref 4.0–10.5)
nRBC: 0 % (ref 0.0–0.2)

## 2021-07-16 LAB — RENAL FUNCTION PANEL
Albumin: 4 g/dL (ref 3.5–5.0)
Anion gap: 3 — ABNORMAL LOW (ref 5–15)
BUN: 32 mg/dL — ABNORMAL HIGH (ref 6–20)
CO2: 24 mmol/L (ref 22–32)
Calcium: 9.2 mg/dL (ref 8.9–10.3)
Chloride: 112 mmol/L — ABNORMAL HIGH (ref 98–111)
Creatinine, Ser: 1.54 mg/dL — ABNORMAL HIGH (ref 0.44–1.00)
GFR, Estimated: 41 mL/min — ABNORMAL LOW (ref >60)
Glucose, Bld: 70 mg/dL (ref 70–99)
Phosphorus: 4 mg/dL (ref 2.5–4.6)
Potassium: 4.9 mmol/L (ref 3.5–5.1)
Sodium: 139 mmol/L (ref 135–145)

## 2021-08-11 ENCOUNTER — Encounter: Payer: Self-pay | Admitting: Physician Assistant

## 2021-08-13 ENCOUNTER — Encounter (HOSPITAL_COMMUNITY): Payer: Self-pay | Admitting: Emergency Medicine

## 2021-08-13 ENCOUNTER — Other Ambulatory Visit: Payer: Self-pay

## 2021-08-13 ENCOUNTER — Emergency Department (HOSPITAL_COMMUNITY)
Admission: EM | Admit: 2021-08-13 | Discharge: 2021-08-13 | Disposition: A | Discharge status: Home / Self Care | Payer: Self-pay | Attending: Emergency Medicine | Admitting: Emergency Medicine

## 2021-08-13 ENCOUNTER — Emergency Department (HOSPITAL_COMMUNITY): Payer: Self-pay

## 2021-08-13 DIAGNOSIS — M62830 Muscle spasm of back: Secondary | ICD-10-CM | POA: Insufficient documentation

## 2021-08-13 DIAGNOSIS — M545 Low back pain, unspecified: Secondary | ICD-10-CM | POA: Insufficient documentation

## 2021-08-13 DIAGNOSIS — N189 Chronic kidney disease, unspecified: Secondary | ICD-10-CM | POA: Insufficient documentation

## 2021-08-13 LAB — URINALYSIS, ROUTINE W REFLEX MICROSCOPIC
Bacteria, UA: NONE SEEN
Bilirubin Urine: NEGATIVE
Glucose, UA: NEGATIVE mg/dL
Hgb urine dipstick: NEGATIVE
Ketones, ur: NEGATIVE mg/dL
Leukocytes,Ua: NEGATIVE
Nitrite: NEGATIVE
Protein, ur: 30 mg/dL — AB
Specific Gravity, Urine: 1.013 (ref 1.005–1.030)
pH: 6 (ref 5.0–8.0)

## 2021-08-13 LAB — CBC WITH DIFFERENTIAL/PLATELET
Abs Immature Granulocytes: 0.02 10*3/uL (ref 0.00–0.07)
Basophils Absolute: 0.1 10*3/uL (ref 0.0–0.1)
Basophils Relative: 1 %
Eosinophils Absolute: 0.8 10*3/uL — ABNORMAL HIGH (ref 0.0–0.5)
Eosinophils Relative: 11 %
HCT: 30.7 % — ABNORMAL LOW (ref 36.0–46.0)
Hemoglobin: 10.3 g/dL — ABNORMAL LOW (ref 12.0–15.0)
Immature Granulocytes: 0 %
Lymphocytes Relative: 22 %
Lymphs Abs: 1.6 10*3/uL (ref 0.7–4.0)
MCH: 31.6 pg (ref 26.0–34.0)
MCHC: 33.6 g/dL (ref 30.0–36.0)
MCV: 94.2 fL (ref 80.0–100.0)
Monocytes Absolute: 0.6 10*3/uL (ref 0.1–1.0)
Monocytes Relative: 9 %
Neutro Abs: 4.1 10*3/uL (ref 1.7–7.7)
Neutrophils Relative %: 57 %
Platelets: 195 10*3/uL (ref 150–400)
RBC: 3.26 MIL/uL — ABNORMAL LOW (ref 3.87–5.11)
RDW: 12.6 % (ref 11.5–15.5)
WBC: 7.2 10*3/uL (ref 4.0–10.5)
nRBC: 0 % (ref 0.0–0.2)

## 2021-08-13 LAB — BASIC METABOLIC PANEL
Anion gap: 3 — ABNORMAL LOW (ref 5–15)
BUN: 30 mg/dL — ABNORMAL HIGH (ref 6–20)
CO2: 24 mmol/L (ref 22–32)
Calcium: 9 mg/dL (ref 8.9–10.3)
Chloride: 114 mmol/L — ABNORMAL HIGH (ref 98–111)
Creatinine, Ser: 1.09 mg/dL — ABNORMAL HIGH (ref 0.44–1.00)
GFR, Estimated: >60 mL/min (ref >60)
Glucose, Bld: 108 mg/dL — ABNORMAL HIGH (ref 70–99)
Potassium: 4.5 mmol/L (ref 3.5–5.1)
Sodium: 141 mmol/L (ref 135–145)

## 2021-08-13 LAB — LIPASE, BLOOD: Lipase: 54 U/L — ABNORMAL HIGH (ref 11–51)

## 2021-08-13 MED ORDER — GABAPENTIN 300 MG PO CAPS: 300.0000 mg | ORAL_CAPSULE | Freq: Three times a day (TID) | ORAL | 0 refills | Status: DC

## 2021-08-13 MED ORDER — DEXAMETHASONE SODIUM PHOSPHATE 10 MG/ML IJ SOLN
8.0000 mg | Freq: Once | INTRAMUSCULAR | Status: AC
  Administered 2021-08-13: 8 mg via INTRAMUSCULAR

## 2021-08-13 MED ORDER — METHOCARBAMOL 500 MG PO TABS: 500.0000 mg | ORAL_TABLET | Freq: Two times a day (BID) | ORAL | 0 refills | Status: DC | PRN

## 2021-08-13 MED ORDER — ACETAMINOPHEN 500 MG PO TABS
1000.0000 mg | ORAL_TABLET | Freq: Once | ORAL | Status: AC
  Administered 2021-08-13: 1000 mg via ORAL
  Filled 2021-08-13: qty 2

## 2021-08-13 MED ORDER — DEXAMETHASONE SODIUM PHOSPHATE 10 MG/ML IJ SOLN
8.0000 mg | Freq: Once | INTRAMUSCULAR | Status: DC
  Filled 2021-08-13: qty 1

## 2021-08-13 NOTE — ED Notes (Signed)
Lab in to draw blood.   Provider states that no IV needed at this time

## 2021-08-13 NOTE — ED Notes (Signed)
Dr Trifan at bedside  

## 2021-08-13 NOTE — ED Triage Notes (Signed)
Pt to the Ed with complaints of lower back pain that began yesterday. Pt has a history of CKD but denies urinary symptoms.

## 2021-08-13 NOTE — Discharge Instructions (Addendum)
You were prescribed a muscle relaxer called Robaxin which she can use as needed for muscle spasms.  You are also prescribed a nerve pain medicine called gabapentin, which can take twice a day as needed.  Please be aware that both of these medicines can make you drowsy and sedated.  Do not drive after taking these medications.  I strongly recommend that you do not take them together, but try one or the other, as needed.  You may find that one or the other is more effective for your pain.  Taking these medicines together can make you extremely drowsy or nauseous.

## 2021-08-13 NOTE — ED Provider Notes (Signed)
[facility] EMERGENCY DEPARTMENT Provider Note   CSN: [id number] Arrival date & time: 08/13/21  [id number]     History  Chief Complaint  Patient presents with   Back Pain    Sandra Parsons is a 51 y.o. female with history of chronic kidney disease, lumbar surgery, presenting to the ED with complaint of lower back pain.  Patient reports gradual onset of pain in her lower back yesterday at work that has been persistent and worsening overnight.  She says she used to suffer from back pain prior to her laminectomy and lumbar surgery.  She denies any falls or trauma.  She describes the pain located in the midline of her lower back and also the left upper flank.  It does not travel down the leg.  She denies dysuria, hematuria, or history of kidney stones.  She does experience issues with renal dysfunction has been seeing a nephrologist for that, tries to avoid NSAID medications.  She is not taken any meds for her pain.  Her pain is significantly worse with any movement and better when lying flat  HPI     Home Medications Prior to Admission medications   Medication Sig Start Date End Date Taking? Authorizing Provider  gabapentin (NEURONTIN) 300 MG capsule Take 1 capsule (300 mg total) by mouth 3 (three) times daily for 15 doses. 08/13/21 08/18/21 Yes Desiree Daise, Carola Rhine, MD  methocarbamol (ROBAXIN) 500 MG tablet Take 1 tablet (500 mg total) by mouth 2 (two) times daily as needed for up to 20 doses for muscle spasms. 08/13/21  Yes Wyvonnia Dusky, MD  atorvastatin (LIPITOR) 20 MG tablet TAKE 1 Tablet BY MOUTH ONCE EVERY DAY 02/04/21   Soyla Dryer, PA-C  gabapentin (NEURONTIN) 100 MG capsule Take 1 capsule (100 mg total) by mouth 2 (two) times daily as needed. 02/24/21   Soyla Dryer, PA-C  insulin glargine (LANTUS SOLOSTAR) 100 UNIT/ML Solostar Pen Inject 25 Units into the skin daily. 07/23/20   Soyla Dryer, PA-C  JANUMET 50-500 MG tablet TAKE 1 Tablet  BY MOUTH TWICE DAILY WITH A MEAL  02/04/21   Soyla Dryer, PA-C  levothyroxine (SYNTHROID) 50 MCG tablet TAKE 1 Tablet BY MOUTH ONCE EVERY DAY 02/04/21   Soyla Dryer, PA-C  Lidocaine 4 % PTCH Apply 1 patch topically daily as needed (pain (associated with pinch nerve pain when working)).    [provider]  lisinopril (ZESTRIL) 20 MG tablet Take 1 tablet by mouth once daily Patient taking differently: 30 mg. 04/06/21   Soyla Dryer, PA-C  naproxen sodium (ALEVE) 220 MG tablet Take 220 mg by mouth 2 (two) times daily with a meal.    [provider]  triamcinolone (KENALOG) 0.025 % ointment Apply 1 application topically at bedtime as needed. 06/14/19   Soyla Dryer, PA-C      Allergies    Prednisone    Review of Systems   Review of Systems  Physical Exam Updated Vital Signs BP (!) 158/78   Pulse 60   Temp (!) 97.5 F (36.4 C) (Oral)   Resp (!) 6   Ht 5\' 7"  (1.702 m)   Wt 75.8 kg   LMP 04/26/2016   SpO2 100%   BMI 26.17 kg/m  Physical Exam Constitutional:      General: She is not in acute distress. HENT:     Head: Normocephalic and atraumatic.  Eyes:     Conjunctiva/sclera: Conjunctivae normal.     Pupils: Pupils are equal, round, and reactive to light.  Cardiovascular:     Rate and Rhythm: Normal rate and regular rhythm.  Pulmonary:     Effort: Pulmonary effort is normal. No respiratory distress.  Abdominal:     General: There is no distension.     Tenderness: There is no abdominal tenderness.  Musculoskeletal:     Comments: Left flank and left lower midline tenderness  Skin:    General: Skin is warm and dry.  Neurological:     General: No focal deficit present.     Mental Status: She is alert. Mental status is at baseline.  Psychiatric:        Mood and Affect: Mood normal.        Behavior: Behavior normal.    ED Results / Procedures / Treatments   Labs (all labs ordered are listed, but only abnormal results are displayed) Labs Reviewed  LIPASE, BLOOD -  Abnormal; Notable for the following components:      Result Value   Lipase 54 (*)    All other components within normal limits  URINALYSIS, ROUTINE W REFLEX MICROSCOPIC - Abnormal; Notable for the following components:   Color, Urine STRAW (*)    Protein, ur 30 (*)    All other components within normal limits  CBC WITH DIFFERENTIAL/PLATELET - Abnormal; Notable for the following components:   RBC 3.26 (*)    Hemoglobin 10.3 (*)    HCT 30.7 (*)    Eosinophils Absolute 0.8 (*)    All other components within normal limits  BASIC METABOLIC PANEL - Abnormal; Notable for the following components:   Chloride 114 (*)    Glucose, Bld 108 (*)    BUN 30 (*)    Creatinine, Ser 1.09 (*)    Anion gap 3 (*)    All other components within normal limits    EKG None  Radiology CT L-SPINE NO CHARGE  Result Date: 08/13/2021 CLINICAL DATA:  Low back pain, increased fracture risk EXAM: CT Lumbar Spine without contrast TECHNIQUE: Technique: Multiplanar CT images of the lumbar spine were reconstructed from contemporary CT of the Abdomen and Pelvis. RADIATION DOSE REDUCTION: This exam was performed according to the departmental dose-optimization program which includes automated exposure control, adjustment of the mA and/or kV according to patient size and/or use of iterative reconstruction technique. CONTRAST:  None COMPARISON:  None Available. FINDINGS: Segmentation: 5 lumbar type vertebrae. Alignment: Mild retrolisthesis at L4-L5. Vertebrae: Degenerative endplate irregularity at L3-L4, L4-L5, and L5-S1. No acute fracture. No destructive osseous lesion. Paraspinal and other soft tissues: 1.9 cm right adrenal nodule with density consistent with an adenoma. Additional 1.5 cm right adrenal nodule also demonstrates density consistent with an adenoma. Disc levels: L1-L2:  Facet hypertrophy.  No canal or foraminal stenosis. L2-L3:  Facet hypertrophy.  No canal or foraminal stenosis. L3-L4: Marked disc height loss.  Disc bulge with endplate osteophytic ridging. Facet hypertrophy and ligamentum flavum thickening. Mild canal and foraminal stenosis. Narrowing of subarticular recesses. L4-L5: Moderate disc height loss. Disc bulge with endplate osteophytic ridging. Facet hypertrophy and ligamentum flavum thickening. Mild canal and foraminal stenosis. Narrowing of the left greater than right subarticular recesses. L5-S1: Disc bulge with endplate osteophytic ridging. Facet hypertrophy with ligamentum flavum thickening. No canal stenosis. Mild foraminal stenosis. IMPRESSION: No acute osseous abnormality. Multilevel spondylosis is greatest at lower lumbar levels. No high-grade stenosis identified. Electronically Signed   By: Macy Mis M.D.   On: 08/13/2021 10:00   CT Renal Stone Study  Result Date: 08/13/2021 CLINICAL DATA:  A  51 year old female presents for evaluation of flank pain with suspected kidney stones. EXAM: CT ABDOMEN AND PELVIS WITHOUT CONTRAST TECHNIQUE: Multidetector CT imaging of the abdomen and pelvis was performed following the standard protocol without IV contrast. RADIATION DOSE REDUCTION: This exam was performed according to the departmental dose-optimization program which includes automated exposure control, adjustment of the mA and/or kV according to patient size and/or use of iterative reconstruction technique. COMPARISON:  Comparison made with the July 10, 2013. FINDINGS: Lower chest: No sign of effusion or of consolidative changes at the lung bases. Hepatobiliary: Smooth hepatic contours. No visible hepatic lesion on noncontrast imaging. Cholelithiasis without pericholecystic stranding. No gross biliary duct distension. Pancreas: No contour abnormality or signs of inflammation. Spleen: Spleen normal size and contour. Adrenals/Urinary Tract: Stable appearance of RIGHT adrenal adenomata. Largest measuring 1.8 cm. Next largest in the body of the adrenal measuring 1.4 cm. No follow-up recommended for these  findings in the absence of endocrine abnormalities. Moderate perinephric stranding, improved when compared to more remote imaging from 2015. No nephrolithiasis. No ureteral calculi. No hydronephrosis. Smooth renal contours aside from perinephric stranding seen on the current study. Stomach/Bowel: Stomach is under distended limiting assessment. There is question of thickening and or stranding about the midportion of the stomach. No signs of small bowel obstruction or inflammation about small bowel. Normal appendix. Colon without signs of adjacent inflammation. Vascular/Lymphatic: Aortic atherosclerosis. No sign of aneurysm. Smooth contour of the IVC. There is no gastrohepatic or hepatoduodenal ligament lymphadenopathy. No retroperitoneal or mesenteric lymphadenopathy. No pelvic sidewall lymphadenopathy. Limited assessment given lack of intravenous contrast with mild moderate atherosclerotic changes. Reproductive: Unremarkable by CT. Other: Trace free pelvic fluid.  No pneumoperitoneum. Musculoskeletal: Spinal degenerative changes greatest at L3-4, see dedicated CT of the spine for further detail. No acute or destructive bone process. IMPRESSION: 1. No urinary tract calculi or obstruction. Perinephric stranding which is improved over previous imaging. Given extent of stranding would however suggest correlation with urine studies to exclude ongoing urinary tract infection/pyelonephritis. 2. Perigastric stranding with suggestion of gastric wall thickening in the mid stomach. Mild contour irregularity of the stomach is also suggested though not well evaluated. Correlate with any symptoms that would suggest gastritis. Given the potential for irregular thickening, dedicated endoscopic assessment may be warranted. 3. Cholelithiasis without pericholecystic stranding. 4. Aortic Atherosclerosis. Aortic Atherosclerosis (ICD10-I70.0). Electronically Signed   By: Zetta Bills M.D.   On: 08/13/2021 10:19     Procedures Procedures    Medications Ordered in ED Medications  dexamethasone (DECADRON) injection 8 mg (has no administration in time range)  acetaminophen (TYLENOL) tablet 1,000 mg (1,000 mg Oral Given 08/13/21 0828)    ED Course/ Medical Decision Making/ A&P Clinical Course as of 08/13/21 1128  Thu Aug 13, 2021  0832 Cr at baseline and improved over prior levels [MT]  1024 CT scans reviewed, she has chronic perinephric stranding but appears improved over prior studies.  UA does not show evidence of infection.  I doubt urosepsis or pyelonephritis.  No ureteral stone.  She has some gastric thickening but does not have persistent GI symptoms or nausea, and I suspect her pain is more likely muscular or nerve related, given how reproducible it is movement.  I can prescribe her some pain medications for home, anticipate discharge. [MT]  [id number] She does not experience gastritis symptoms.  Okay for discharge, we will give a single dose of Decadron here in the ER for what I suspect is radicular pain, and also discharged with muscle  relaxers for suspected muscular strain component to her muscle spasms which she is describing.  She is diabetic on insulin reports her last A1c 6.2 -I think a single dose of Decadron to be safe to give, explained that she may have a bump in her blood sugars today and tomorrow. [MT]    Clinical Course User Index [MT] Gaige Fussner, Carola Rhine, MD                           Medical Decision Making Amount and/or Complexity of Data Reviewed Labs: ordered. Radiology: ordered.  Risk OTC drugs. Prescription drug management.   This patient presents to the ED with concern for lower back pain. This involves an extensive number of treatment options, and is a complaint that carries with it a high risk of complications and morbidity.  The differential diagnosis includes musculoskeletal pain versus pinched nerve or disc herniation versus paralumbar spasm versus kidney infection versus  ureteral colic versus other  Co-morbidities that complicate the patient evaluation: History of spinal surgery known degenerative disc disease of the lumbar spine places patient at high risk for continued spinal pathology  Low suspicion for cauda equina syndrome or epidural abscess or infection.  Do not believe needed emergent MRI of the lumbar spine  External records from outside source obtained and reviewed including CKD with Cr 1.54 on 07/16/21 most recent lab check  I ordered and personally interpreted labs.  The pertinent results include: Creatinine at baseline, no leukocytosis  I ordered imaging studies including CT abdomen, CT lumbar spine I independently visualized and interpreted imaging which showed degenerative disc disease, incidental findings of gallstones, stomach thickening, mild chronic perinephric stranding improved from prior, no acute stone I agree with the radiologist interpretation  I ordered medication including Tylenol for pain Avoiding NSAIDs due to chronic kidney disease  I have reviewed the patients home medicines and have made adjustments as needed  After the interventions noted above, I reevaluated the patient and found that they have: stayed the same  Discussed the patient's incidental CT findings with her.  Low suspicion for gastritis at this time.  She does not have symptoms or signs of biliary colic.  We did discuss incidental gallstones.   Dispostion:  After consideration of the diagnostic results and the patients response to treatment, I feel that the patent would benefit from outpatient follow up.         Final Clinical Impression(s) / ED Diagnoses Final diagnoses:  Acute midline low back pain without sciatica  Muscle spasm of back    Rx / DC Orders ED Discharge Orders          Ordered    gabapentin (NEURONTIN) 300 MG capsule  3 times daily        08/13/21 1049    methocarbamol (ROBAXIN) 500 MG tablet  2 times daily PRN        08/13/21  1049              Wyvonnia Dusky, MD 08/13/21 1128

## 2021-08-18 ENCOUNTER — Telehealth: Payer: Self-pay

## 2021-08-18 NOTE — Telephone Encounter (Signed)
Called to follow up regarding recent ER visit. Dialed number listed in [place] and states call cannot be connected as dialed. , attempted x 2.  Debria Garret RN [facility] connect

## 2021-08-24 ENCOUNTER — Other Ambulatory Visit (HOSPITAL_COMMUNITY)
Admission: RE | Admit: 2021-08-24 | Discharge: 2021-08-24 | Disposition: A | Discharge status: Home / Self Care | Payer: Self-pay | Source: Ambulatory Visit | Attending: Nephrology | Admitting: Nephrology

## 2021-08-24 ENCOUNTER — Other Ambulatory Visit: Payer: Self-pay | Admitting: Physician Assistant

## 2021-08-24 DIAGNOSIS — I10 Essential (primary) hypertension: Secondary | ICD-10-CM | POA: Insufficient documentation

## 2021-08-24 DIAGNOSIS — R300 Dysuria: Secondary | ICD-10-CM | POA: Insufficient documentation

## 2021-08-24 DIAGNOSIS — N1831 Chronic kidney disease, stage 3a: Secondary | ICD-10-CM | POA: Insufficient documentation

## 2021-08-24 DIAGNOSIS — E118 Type 2 diabetes mellitus with unspecified complications: Secondary | ICD-10-CM | POA: Insufficient documentation

## 2021-08-24 DIAGNOSIS — E785 Hyperlipidemia, unspecified: Secondary | ICD-10-CM | POA: Insufficient documentation

## 2021-08-24 LAB — CBC
HCT: 29.6 % — ABNORMAL LOW (ref 36.0–46.0)
Hemoglobin: 10 g/dL — ABNORMAL LOW (ref 12.0–15.0)
MCH: 32.3 pg (ref 26.0–34.0)
MCHC: 33.8 g/dL (ref 30.0–36.0)
MCV: 95.5 fL (ref 80.0–100.0)
Platelets: 248 10*3/uL (ref 150–400)
RBC: 3.1 MIL/uL — ABNORMAL LOW (ref 3.87–5.11)
RDW: 12.9 % (ref 11.5–15.5)
WBC: 7.7 10*3/uL (ref 4.0–10.5)
nRBC: 0 % (ref 0.0–0.2)

## 2021-08-24 LAB — HEMOGLOBIN A1C
Hgb A1c MFr Bld: 6.3 % — ABNORMAL HIGH (ref 4.8–5.6)
Mean Plasma Glucose: 134.11 mg/dL

## 2021-08-24 LAB — COMPREHENSIVE METABOLIC PANEL
ALT: 27 U/L (ref 0–44)
AST: 31 U/L (ref 15–41)
Albumin: 4.2 g/dL (ref 3.5–5.0)
Alkaline Phosphatase: 63 U/L (ref 38–126)
Anion gap: 5 (ref 5–15)
BUN: 38 mg/dL — ABNORMAL HIGH (ref 6–20)
CO2: 24 mmol/L (ref 22–32)
Calcium: 9.2 mg/dL (ref 8.9–10.3)
Chloride: 112 mmol/L — ABNORMAL HIGH (ref 98–111)
Creatinine, Ser: 1.15 mg/dL — ABNORMAL HIGH (ref 0.44–1.00)
GFR, Estimated: 58 mL/min — ABNORMAL LOW (ref >60)
Glucose, Bld: 67 mg/dL — ABNORMAL LOW (ref 70–99)
Potassium: 4.5 mmol/L (ref 3.5–5.1)
Sodium: 141 mmol/L (ref 135–145)
Total Bilirubin: 0.6 mg/dL (ref 0.3–1.2)
Total Protein: 7.8 g/dL (ref 6.5–8.1)

## 2021-08-25 LAB — URINE CULTURE: Culture: NO GROWTH

## 2021-08-31 ENCOUNTER — Ambulatory Visit: Payer: Self-pay | Admitting: Physician Assistant

## 2021-09-28 ENCOUNTER — Ambulatory Visit: Payer: Self-pay | Admitting: Physician Assistant

## 2021-09-28 ENCOUNTER — Encounter: Payer: Self-pay | Admitting: Physician Assistant

## 2021-09-28 VITALS — BP 135/78 | HR 71 | Temp 96.1°F

## 2021-09-28 DIAGNOSIS — R195 Other fecal abnormalities: Secondary | ICD-10-CM

## 2021-09-28 DIAGNOSIS — Z1231 Encounter for screening mammogram for malignant neoplasm of breast: Secondary | ICD-10-CM

## 2021-09-28 DIAGNOSIS — I10 Essential (primary) hypertension: Secondary | ICD-10-CM

## 2021-09-28 DIAGNOSIS — E785 Hyperlipidemia, unspecified: Secondary | ICD-10-CM

## 2021-09-28 DIAGNOSIS — D631 Anemia in chronic kidney disease: Secondary | ICD-10-CM

## 2021-09-28 DIAGNOSIS — E039 Hypothyroidism, unspecified: Secondary | ICD-10-CM

## 2021-09-28 DIAGNOSIS — R9389 Abnormal findings on diagnostic imaging of other specified body structures: Secondary | ICD-10-CM

## 2021-09-28 DIAGNOSIS — N1832 Chronic kidney disease, stage 3b: Secondary | ICD-10-CM

## 2021-09-28 DIAGNOSIS — E118 Type 2 diabetes mellitus with unspecified complications: Secondary | ICD-10-CM

## 2021-09-28 NOTE — Progress Notes (Signed)
BP 135/78   Pulse 71   Temp (!) 96.1 F (35.6 C)   LMP 04/26/2016   SpO2 98%    Subjective:    Patient ID: Sandra Parsons, female    DOB: 17-May-1970, 51 y.o.   MRN: [id number]  HPI: Sandra Parsons is a 50 y.o. female presenting on 09/28/2021 for No chief complaint on file.   HPI   Pt is 50yoF who presents for follow up DM, HTN and dyslipidemia.  Her labs were done about a month ago so no labs were ordered to be done prior to this appointment.    Pt says she got letter for cafa about a monh ago.  She wants to get her colonoscopy done.  She says she called the GI office and was told that she would need another referral.    She is still working at [employer] and she has got some dentures on order.  She has no complaints today.     Relevant past medical, surgical, family and social history reviewed and updated as indicated. Interim medical history since our last visit reviewed. Allergies and medications reviewed and updated.   Current Outpatient Medications:    atorvastatin (LIPITOR) 20 MG tablet, TAKE 1 Tablet BY MOUTH ONCE EVERY DAY, Disp: 90 tablet, Rfl: 1   insulin glargine (LANTUS SOLOSTAR) 100 UNIT/ML Solostar Pen, Inject 25 Units into the skin daily., Disp: 15 mL, Rfl: 0   JANUMET 50-500 MG tablet, TAKE 1 Tablet  BY MOUTH TWICE DAILY WITH A MEAL, Disp: 180 tablet, Rfl: 1   levothyroxine (SYNTHROID) 50 MCG tablet, TAKE 1 Tablet BY MOUTH ONCE EVERY DAY, Disp: 90 tablet, Rfl: 1   Lidocaine 4 % PTCH, Apply 1 patch topically daily as needed (pain (associated with pinch nerve pain when working))., Disp: , Rfl:    lisinopril (ZESTRIL) 20 MG tablet, Take 1 tablet by mouth once daily (Patient taking differently: 30 mg.), Disp: 30 tablet, Rfl: 4   gabapentin (NEURONTIN) 100 MG capsule, Take 1 capsule (100 mg total) by mouth 2 (two) times daily as needed. (Patient not taking: Reported on 09/28/2021), Disp: 60 capsule, Rfl: 0   gabapentin (NEURONTIN) 300 MG capsule, Take 1 capsule (300 mg  total) by mouth 3 (three) times daily for 15 doses. (Patient not taking: Reported on 09/28/2021), Disp: 15 capsule, Rfl: 0   methocarbamol (ROBAXIN) 500 MG tablet, Take 1 tablet (500 mg total) by mouth 2 (two) times daily as needed for up to 20 doses for muscle spasms. (Patient not taking: Reported on 09/28/2021), Disp: 20 tablet, Rfl: 0   triamcinolone (KENALOG) 0.025 % ointment, Apply 1 application topically at bedtime as needed., Disp: 15 g, Rfl: 1    Review of Systems  Per HPI unless specifically indicated above     Objective:    BP 135/78   Pulse 71   Temp (!) 96.1 F (35.6 C)   LMP 04/26/2016   SpO2 98%   Wt Readings from Last 3 Encounters:  08/13/21 167 lb 1.7 oz (75.8 kg)  07/14/21 167 lb (75.8 kg)  06/29/21 172 lb 8 oz (78.2 kg)    Physical Exam Vitals reviewed.  Constitutional:      General: She is not in acute distress.    Appearance: She is well-developed. She is not toxic-appearing.  HENT:     Head: Normocephalic and atraumatic.  Cardiovascular:     Rate and Rhythm: Normal rate and regular rhythm.  Pulmonary:     Effort: Pulmonary effort is normal.  Breath sounds: Normal breath sounds.  Abdominal:     General: Bowel sounds are normal.     Palpations: Abdomen is soft. There is no mass.     Tenderness: There is no abdominal tenderness.  Musculoskeletal:     Cervical back: Neck supple.     Right lower leg: No edema.     Left lower leg: No edema.  Lymphadenopathy:     Cervical: No cervical adenopathy.  Skin:    General: Skin is warm and dry.  Neurological:     Mental Status: She is alert and oriented to person, place, and time.  Psychiatric:        Behavior: Behavior normal.           Assessment & Plan:   Encounter Diagnoses  Name Primary?   Essential hypertension Yes   Controlled diabetes mellitus type 2 with complications, unspecified whether long term insulin use (HCC)    Stage 3b chronic kidney disease (HCC)    Hyperlipidemia,  unspecified hyperlipidemia type    Hypothyroidism, unspecified type    Anemia due to chronic kidney disease, unspecified CKD stage    Abnormal CT scan    Positive FIT (fecal immunochemical test)    Encounter for screening mammogram for malignant neoplasm of breast        DM -DM foot exam was updated -continue current medications and DM diet   HTN -continue current medications  Dyslipidemia -continue current medications   CKD -pt was given Pt Assistance aPplication for farxiga to complete and take to the nephrologist who wants her to be on the medication.  The medication is not available through the [facility] pharmacy and it is to expensive for purchase.  Pt to continue with nephrologist per his recomendation   Positive FIT test/anemia/abnormal CT  -re-refer to GI for continuing treatment/colonoscopy.   Will have them look at CT also which showed some gastric wal thickening  HCM -pt is referred for screening mammogram  -pt to follow up 3 months.  She is to contact office sooner prn

## 2021-09-29 ENCOUNTER — Encounter: Payer: Self-pay | Admitting: Internal Medicine

## 2021-10-17 IMAGING — DX DG CERVICAL SPINE COMPLETE 4+V
6 series · 6 of 6 positions shown · non-contrast
Comparison: 12/25/2013.

CLINICAL DATA: Cervical radiculopathy.

EXAM:
CERVICAL SPINE - COMPLETE 4+ VIEW

[c-spine lat]
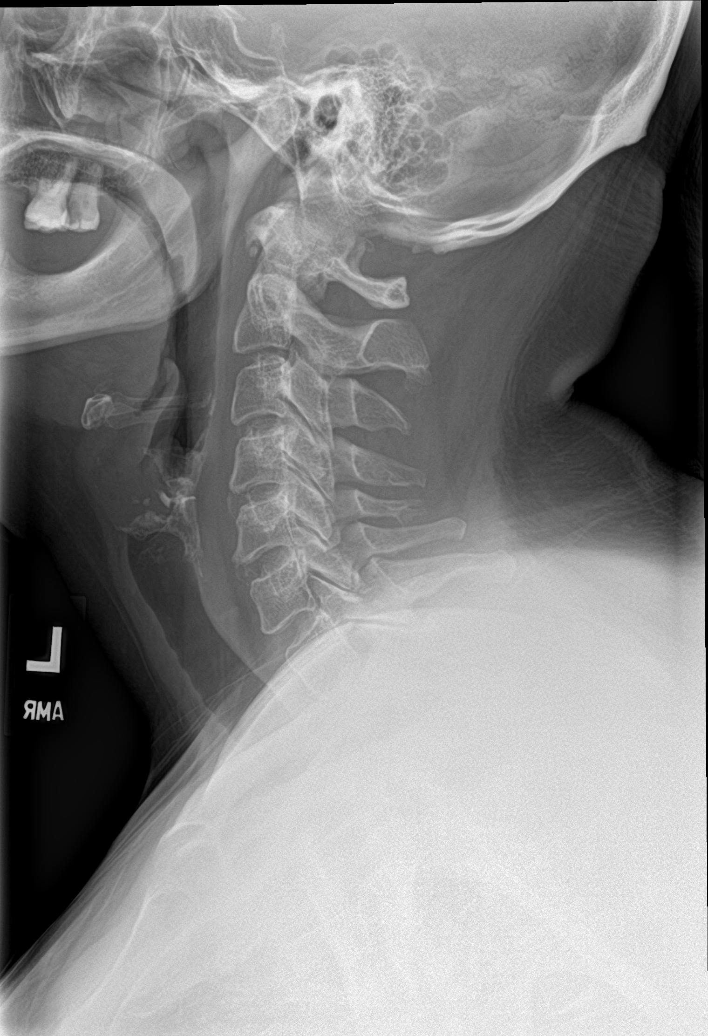

[c-spine obl (1 of 2)]
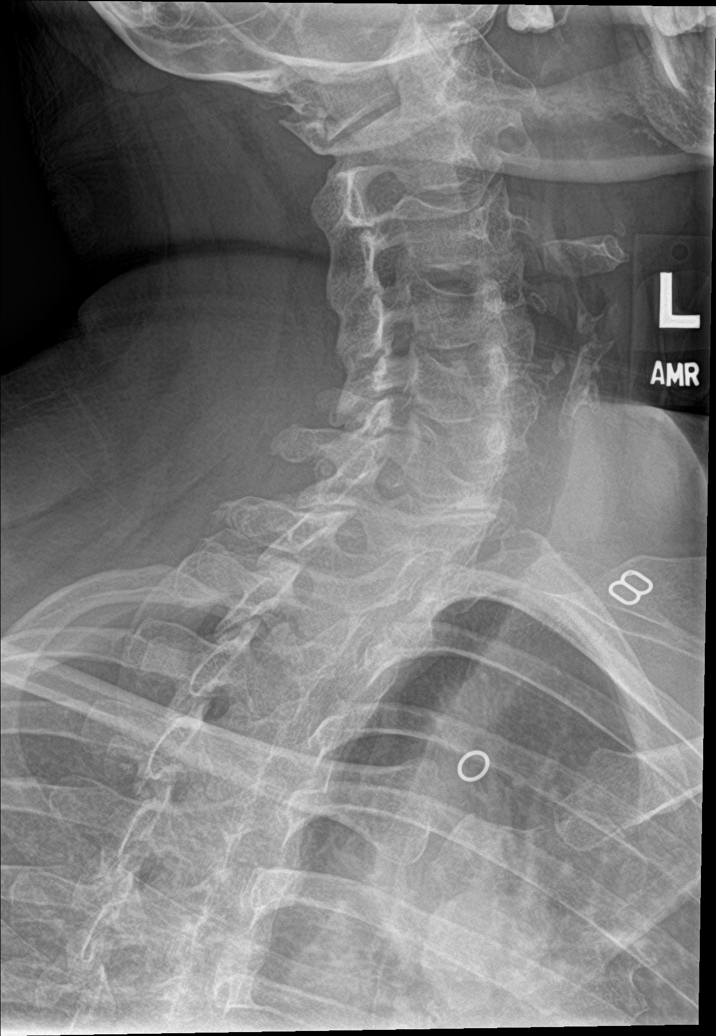

[c-spine obl (2 of 2)]
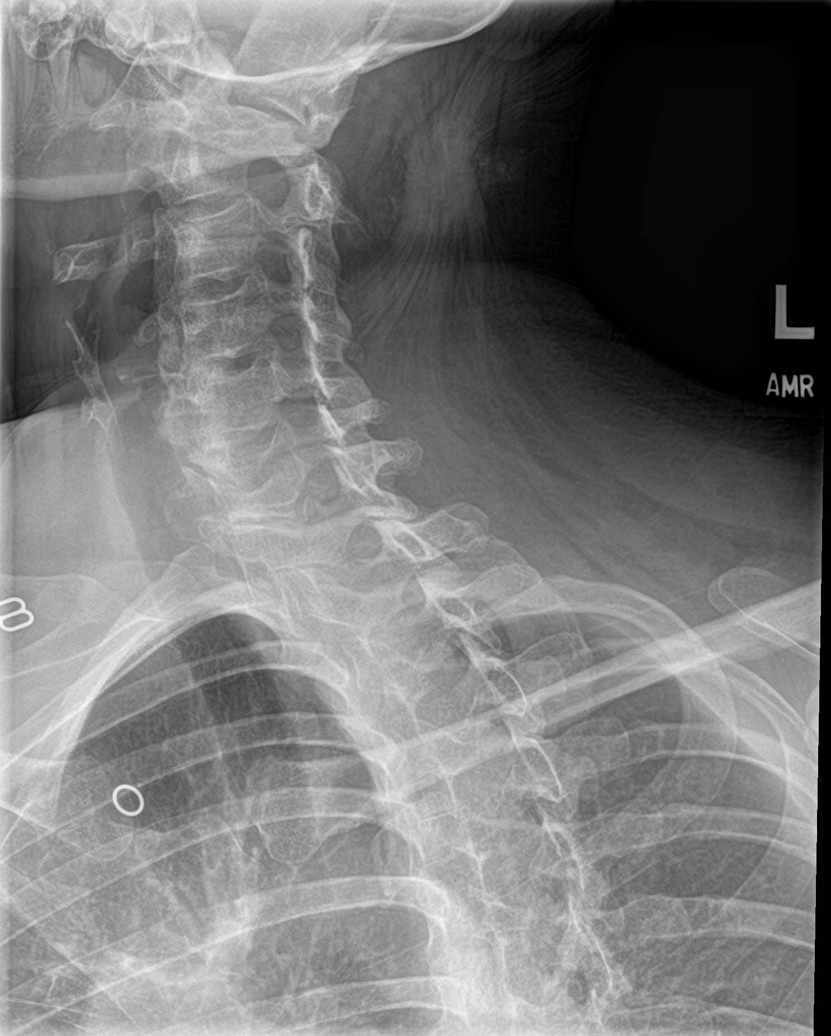

[c-spine ap]
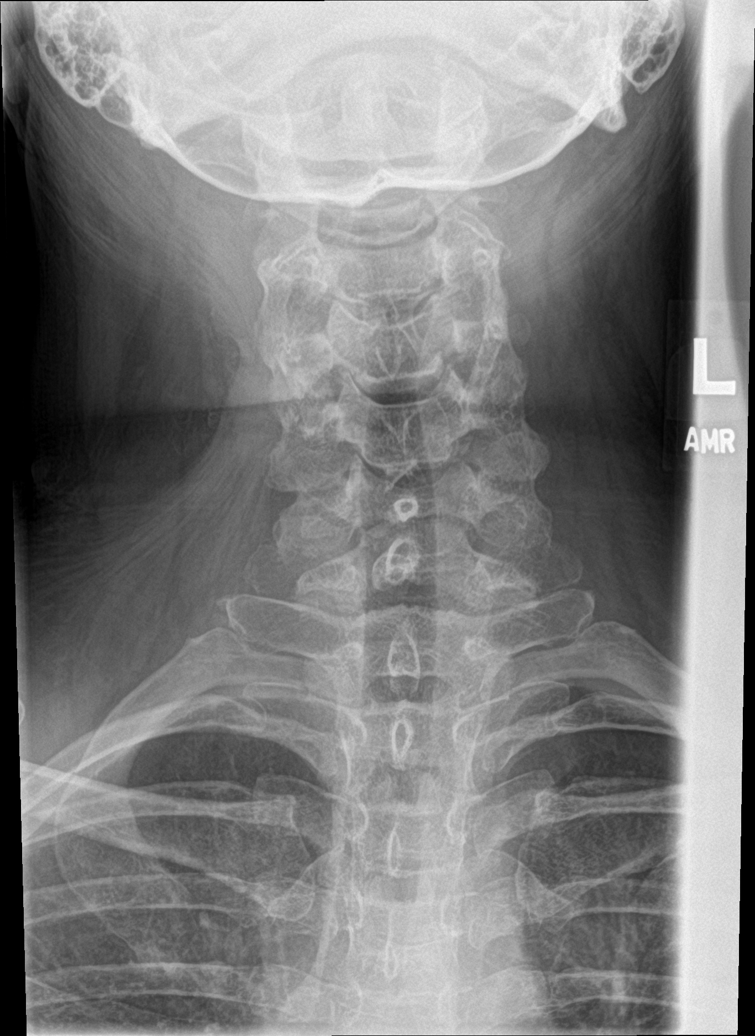

[c-spine open mouth]
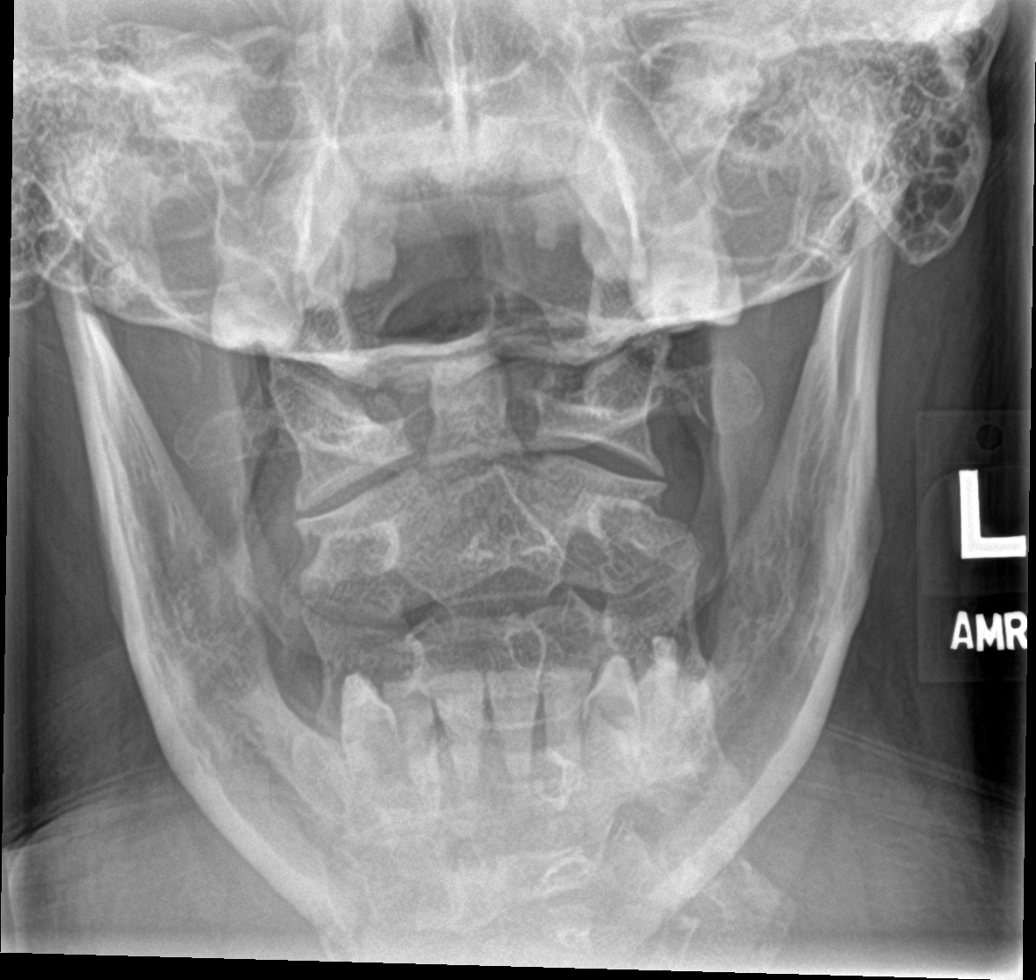

[c-spine swimmers trauma]
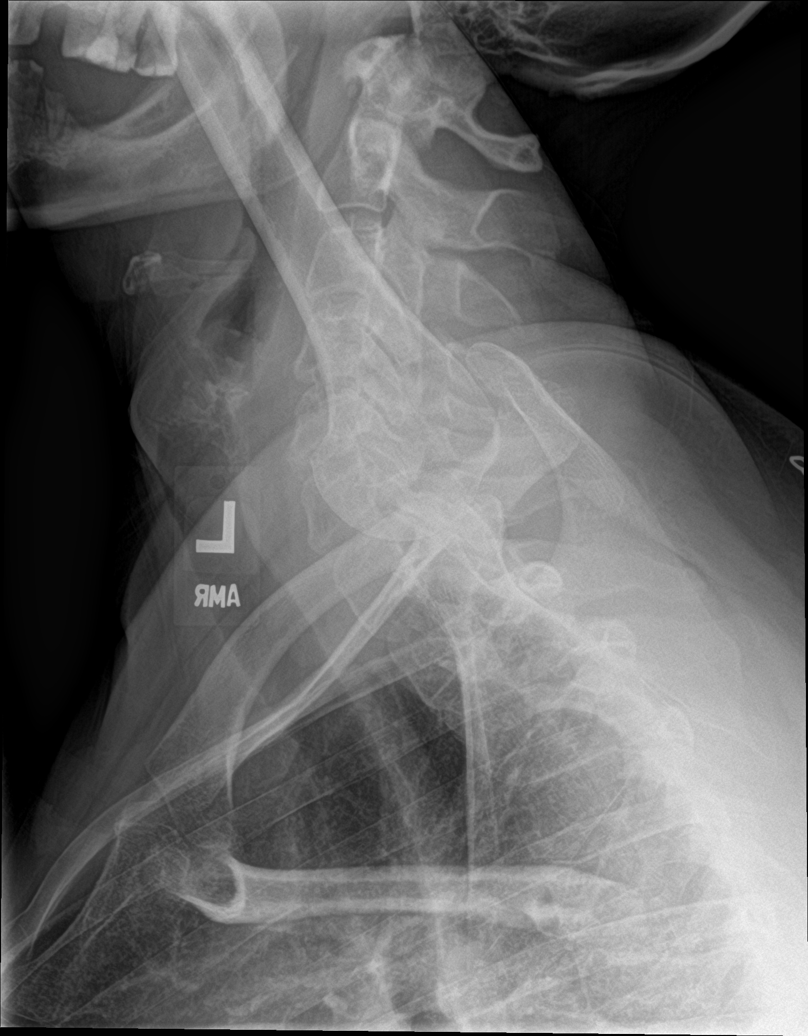

[6 of 6 positions shown; findings below may reference images not displayed]

FINDINGS: Diffuse multilevel degenerative change. No acute abnormality
identified. No evidence of fracture. No evidence of dislocation.
Pulmonary apices are clear.
IMPRESSION: Diffuse multilevel degenerative change. No acute abnormality
identified.

## 2021-11-19 ENCOUNTER — Ambulatory Visit: Payer: Self-pay | Admitting: Gastroenterology

## 2021-11-27 ENCOUNTER — Emergency Department (HOSPITAL_COMMUNITY)
Admission: EM | Admit: 2021-11-27 | Discharge: 2021-11-27 | Disposition: A | Discharge status: Home / Self Care | Payer: Self-pay | Attending: Emergency Medicine | Admitting: Emergency Medicine

## 2021-11-27 ENCOUNTER — Other Ambulatory Visit: Payer: Self-pay

## 2021-11-27 ENCOUNTER — Encounter (HOSPITAL_COMMUNITY): Payer: Self-pay | Admitting: *Deleted

## 2021-11-27 DIAGNOSIS — X501XXA Overexertion from prolonged static or awkward postures, initial encounter: Secondary | ICD-10-CM | POA: Insufficient documentation

## 2021-11-27 DIAGNOSIS — S86811A Strain of other muscle(s) and tendon(s) at lower leg level, right leg, initial encounter: Secondary | ICD-10-CM

## 2021-11-27 DIAGNOSIS — S86911A Strain of unspecified muscle(s) and tendon(s) at lower leg level, right leg, initial encounter: Secondary | ICD-10-CM | POA: Insufficient documentation

## 2021-11-27 MED ORDER — METHOCARBAMOL 500 MG PO TABS: 500.0000 mg | ORAL_TABLET | Freq: Two times a day (BID) | ORAL | 0 refills | Status: DC

## 2021-11-27 MED ORDER — OXYCODONE-ACETAMINOPHEN 5-325 MG PO TABS
2.0000 | ORAL_TABLET | Freq: Once | ORAL | Status: AC
  Administered 2021-11-27: 2 via ORAL
  Filled 2021-11-27: qty 2

## 2021-11-27 NOTE — ED Triage Notes (Signed)
Pt twisted right lower leg after stepping down that occurred today. Swelling to right lower leg. Not able to put weight on leg.

## 2021-11-27 NOTE — Discharge Instructions (Addendum)
Please use Tylenol or ibuprofen for pain.  You may use 600 mg ibuprofen every 6 hours or 1000 mg of Tylenol every 6 hours.  You may choose to alternate between the 2.  This would be most effective.  Not to exceed 4 g of Tylenol within 24 hours.  Not to exceed 3200 mg ibuprofen 24 hours.  You can use the muscle relaxant I am prescribing in addition to the above to help with any breakthrough pain.  You can take it up to twice daily.  It is safe to take at night, but I would be cautious taking it during the day as it can cause some drowsiness.  Make sure that you are feeling awake and alert before you get behind the wheel of a car or operate a motor vehicle.  It is not a narcotic pain medication so you are able to take it if it is not making you drowsy and still pilot a vehicle or machinery safely.  I would use the crutches for the next few days, but if you feel like you can place weight on the foot you can slowly reintroduce weightbearing as tolerated.  Please follow-up with orthopedics to check in for improvement of the injury, and further evaluate if you are still completely unable to bear weight by Monday.

## 2021-11-27 NOTE — ED Provider Notes (Signed)
[facility] EMERGENCY DEPARTMENT Provider Note   CSN: [id number] Arrival date & time: 11/27/21  1651     History  Chief Complaint  Patient presents with   Leg Pain    Sandra Parsons is a 51 y.o. female with noncontributory past medical history presents with concern for acute onset right lower leg calf pain after stepping down off of a ledge today.  Patient reports that she twisted but not at the ankle, and came down hard on the leg.  She denies any pain in the ankle, foot but reports significant pain, muscle spasm in the calf.  She is worried that she may have torn or strained something in the calf.  She took some Aleve and applied ice prior to arrival.   Leg Pain      Home Medications Prior to Admission medications   Medication Sig Start Date End Date Taking? Authorizing Provider  methocarbamol (ROBAXIN) 500 MG tablet Take 1 tablet (500 mg total) by mouth 2 (two) times daily. 11/27/21  Yes Starlette Thurow H, PA-C  atorvastatin (LIPITOR) 20 MG tablet TAKE 1 Tablet BY MOUTH ONCE EVERY DAY 08/24/21   Jacquelin Hawking, PA-C  gabapentin (NEURONTIN) 100 MG capsule Take 1 capsule (100 mg total) by mouth 2 (two) times daily as needed. Patient not taking: Reported on 09/28/2021 02/24/21   Jacquelin Hawking, PA-C  gabapentin (NEURONTIN) 300 MG capsule Take 1 capsule (300 mg total) by mouth 3 (three) times daily for 15 doses. Patient not taking: Reported on 09/28/2021 08/13/21 08/18/21  Terald Sleeper, MD  insulin glargine (LANTUS SOLOSTAR) 100 UNIT/ML Solostar Pen Inject 25 Units into the skin daily. 07/23/20   Jacquelin Hawking, PA-C  JANUMET 50-500 MG tablet TAKE 1 Tablet  BY MOUTH TWICE DAILY WITH A MEAL 08/24/21   Jacquelin Hawking, PA-C  levothyroxine (SYNTHROID) 50 MCG tablet TAKE 1 Tablet BY MOUTH ONCE EVERY DAY 08/24/21   Jacquelin Hawking, PA-C  Lidocaine 4 % PTCH Apply 1 patch topically daily as needed (pain (associated with pinch nerve pain when working)).    [provider]   lisinopril (ZESTRIL) 20 MG tablet Take 1 tablet by mouth once daily Patient taking differently: 30 mg. 04/06/21   Jacquelin Hawking, PA-C  triamcinolone (KENALOG) 0.025 % ointment Apply 1 application topically at bedtime as needed. 06/14/19   Jacquelin Hawking, PA-C      Allergies    Prednisone    Review of Systems   Review of Systems  Musculoskeletal:  Positive for myalgias.  All other systems reviewed and are negative.   Physical Exam Updated Vital Signs BP 103/63 (BP Location: Right Arm)   Pulse 80   Temp 98 F (36.7 C) (Oral)   Resp 20   Ht 5\' 7"  (1.702 m)   Wt 79.4 kg   LMP 04/26/2016   SpO2 100%   BMI 27.41 kg/m  Physical Exam Vitals and nursing note reviewed.  Constitutional:      General: She is not in acute distress.    Appearance: Normal appearance.  HENT:     Head: Normocephalic and atraumatic.  Eyes:     General:        Right eye: No discharge.        Left eye: No discharge.  Cardiovascular:     Rate and Rhythm: Normal rate and regular rhythm.     Pulses: Normal pulses.  Pulmonary:     Effort: Pulmonary effort is normal. No respiratory distress.  Musculoskeletal:  General: No deformity.     Comments: Patient with significant tenderness to palpation and muscle spasming noted on right calf.  There is no shortening, or evidence of rupture at the Achilles tendon or posterior knee space.  Concern for strain but without evidence of complete rupture of Achilles heel, calf, or soleus.  Skin:    General: Skin is warm and dry.     Capillary Refill: Capillary refill takes less than 2 seconds.  Neurological:     Mental Status: She is alert and oriented to person, place, and time.  Psychiatric:        Mood and Affect: Mood normal.        Behavior: Behavior normal.     ED Results / Procedures / Treatments   Labs (all labs ordered are listed, but only abnormal results are displayed) Labs Reviewed - No data to display  EKG None  Radiology No results  found.  Procedures Procedures    Medications Ordered in ED Medications  oxyCODONE-acetaminophen (PERCOCET/ROXICET) 5-325 MG per tablet 2 tablet (2 tablets Oral Given 11/27/21 2132)    ED Course/ Medical Decision Making/ A&P                           Medical Decision Making Risk Prescription drug management.   This is an overall well-appearing 51 year old female who presents with acute injury of right calf.  Patient reports stepping off a porch, having significant tightness in the right calf and being unable to bear weight since earlier today.  Members differential diagnosis includes Achilles tendon rupture, gastrocnemius rupture, soleus rupture, acute fracture, dislocation of the tibia, fibula, ankle sprain, or other foot injury.  On my exam And is neurovascularly intact with no evidence of injury of the ankle, foot.  She has intact flexion, extension at the knee, intact dorsiflexion, plantarflexion at the ankle.  She has significant tightness and pain of the right calf consistent with either partial tear or severe's strain/sprain.  We will provide her with crutches, encouraged ice, rest, compression, elevation, ibuprofen, Tylenol, muscle relaxant and close orthopedic follow-up for further evaluation.  Patient understands and agrees to plan, and is discharged in stable condition at this time. Final Clinical Impression(s) / ED Diagnoses Final diagnoses:  Strain of calf muscle, right, initial encounter    Rx / DC Orders ED Discharge Orders          Ordered    methocarbamol (ROBAXIN) 500 MG tablet  2 times daily        11/27/21 2122              [place] 11/27/21 2145    Bethann Berkshire, MD 11/28/21 1246

## 2021-11-30 ENCOUNTER — Encounter: Payer: Self-pay | Admitting: Orthopedic Surgery

## 2021-11-30 ENCOUNTER — Other Ambulatory Visit (HOSPITAL_COMMUNITY)
Admission: RE | Admit: 2021-11-30 | Discharge: 2021-11-30 | Disposition: A | Discharge status: Home / Self Care | Payer: Self-pay | Source: Ambulatory Visit | Attending: Nephrology | Admitting: Nephrology

## 2021-11-30 ENCOUNTER — Ambulatory Visit (INDEPENDENT_AMBULATORY_CARE_PROVIDER_SITE_OTHER): Payer: Self-pay | Admitting: Orthopedic Surgery

## 2021-11-30 VITALS — BP 132/68 | HR 95 | Ht 67.0 in | Wt 175.0 lb

## 2021-11-30 DIAGNOSIS — N170 Acute kidney failure with tubular necrosis: Secondary | ICD-10-CM | POA: Insufficient documentation

## 2021-11-30 DIAGNOSIS — S86811A Strain of other muscle(s) and tendon(s) at lower leg level, right leg, initial encounter: Secondary | ICD-10-CM

## 2021-11-30 LAB — IRON AND TIBC
Iron: 86 ug/dL (ref 28–170)
Saturation Ratios: 23 % (ref 10.4–31.8)
TIBC: 369 ug/dL (ref 250–450)
UIBC: 283 ug/dL

## 2021-11-30 LAB — RENAL FUNCTION PANEL
Albumin: 4.3 g/dL (ref 3.5–5.0)
Anion gap: 5 (ref 5–15)
BUN: 40 mg/dL — ABNORMAL HIGH (ref 6–20)
CO2: 20 mmol/L — ABNORMAL LOW (ref 22–32)
Calcium: 9.4 mg/dL (ref 8.9–10.3)
Chloride: 113 mmol/L — ABNORMAL HIGH (ref 98–111)
Creatinine, Ser: 1.37 mg/dL — ABNORMAL HIGH (ref 0.44–1.00)
GFR, Estimated: 47 mL/min — ABNORMAL LOW (ref >60)
Glucose, Bld: 113 mg/dL — ABNORMAL HIGH (ref 70–99)
Phosphorus: 3.4 mg/dL (ref 2.5–4.6)
Potassium: 5 mmol/L (ref 3.5–5.1)
Sodium: 138 mmol/L (ref 135–145)

## 2021-11-30 LAB — CBC
HCT: 32.1 % — ABNORMAL LOW (ref 36.0–46.0)
Hemoglobin: 10.7 g/dL — ABNORMAL LOW (ref 12.0–15.0)
MCH: 32 pg (ref 26.0–34.0)
MCHC: 33.3 g/dL (ref 30.0–36.0)
MCV: 96.1 fL (ref 80.0–100.0)
Platelets: 232 10*3/uL (ref 150–400)
RBC: 3.34 MIL/uL — ABNORMAL LOW (ref 3.87–5.11)
RDW: 12.6 % (ref 11.5–15.5)
WBC: 9 10*3/uL (ref 4.0–10.5)
nRBC: 0 % (ref 0.0–0.2)

## 2021-11-30 LAB — FERRITIN: Ferritin: 21 ng/mL (ref 11–307)

## 2021-11-30 LAB — PROTEIN / CREATININE RATIO, URINE
Creatinine, Urine: 238.34 mg/dL
Protein Creatinine Ratio: 0.17 mg/mg{Cre} — ABNORMAL HIGH (ref 0.00–0.15)
Total Protein, Urine: 41 mg/dL

## 2021-11-30 NOTE — Progress Notes (Signed)
[facility] Clinic Return  Assessment: Sandra Parsons is a 51 y.o. female with the following: Right calf muscle strain  Plan: Sandra Parsons sustained an injury to her right calf muscle.  Her Achilles tendon is intact.  She already states that her pain and mobility is improving.  Symptomatic treatment.  Limited WB as tolerated.  Ok to return to work.  She will contact clinic if she has any further issues.  Follow-up as needed.   Follow-up: Return if symptoms worsen or fail to improve.   Subjective:  Chief Complaint  Patient presents with   Leg Pain    RT calf strain/swollen/has improved a little DOI 11/27/21 was in the process of stepping down when someone called her name and she turned and felt pain    History of Present Illness: Sandra Parsons is a 51 y.o. female who presents to clinic for evaluation of right calf muscle pain.  She states just a few days ago, she was stepping off a step, with some cold running.  She turned to look, and felt a sharp pain in the middle of her right calf muscle.  She stated it was akin to a very strong charley horse.  She presented to the emergency department.  Since then her pain is improving.  Her range of motion is getting better.  She does continue to walk with the assistance of a crutch.  Review of Systems: No fevers or chills No numbness or tingling No chest pain No shortness of breath No bowel or bladder dysfunction No GI distress No headaches  Objective: BP 132/68   Pulse 95   Ht 5\' 7"  (1.702 m)   Wt 175 lb (79.4 kg)   LMP 04/26/2016   BMI 27.41 kg/m   Physical Exam:  Alert and oriented.  No acute distress.  Evaluation of right leg demonstrates some mild bruising and swelling in the calf musculature.  Achilles tendon is intact.  She has intact dorsiflexion and plantarflexion of the ankle.  Tenderness to palpation within the muscle.  Toes warm and well-perfused.  Sensation is intact over the dorsum of the foot.  IMAGING: I  personally ordered and reviewed the following images:  No new imaging obtained today.  06/24/2016, MD 11/30/2021 2:59 PM

## 2021-12-01 LAB — PTH, INTACT AND CALCIUM
Calcium, Total (PTH): 9.8 mg/dL (ref 8.7–10.2)
PTH: 13 pg/mL — ABNORMAL LOW (ref 15–65)

## 2021-12-02 ENCOUNTER — Telehealth: Payer: Self-pay

## 2021-12-02 NOTE — Telephone Encounter (Signed)
Telephoned patient a mobile number. Patient will call back at a later time and schedule with [facility] (scholarship).

## 2021-12-07 ENCOUNTER — Other Ambulatory Visit: Payer: Self-pay | Admitting: Physician Assistant

## 2021-12-07 DIAGNOSIS — E039 Hypothyroidism, unspecified: Secondary | ICD-10-CM

## 2021-12-07 DIAGNOSIS — I10 Essential (primary) hypertension: Secondary | ICD-10-CM

## 2021-12-07 DIAGNOSIS — E785 Hyperlipidemia, unspecified: Secondary | ICD-10-CM

## 2021-12-07 DIAGNOSIS — E118 Type 2 diabetes mellitus with unspecified complications: Secondary | ICD-10-CM

## 2021-12-28 ENCOUNTER — Other Ambulatory Visit (HOSPITAL_COMMUNITY)
Admission: RE | Admit: 2021-12-28 | Discharge: 2021-12-28 | Disposition: A | Discharge status: Home / Self Care | Payer: Self-pay | Source: Ambulatory Visit | Attending: Physician Assistant | Admitting: Physician Assistant

## 2021-12-28 DIAGNOSIS — E785 Hyperlipidemia, unspecified: Secondary | ICD-10-CM | POA: Insufficient documentation

## 2021-12-28 DIAGNOSIS — E039 Hypothyroidism, unspecified: Secondary | ICD-10-CM | POA: Insufficient documentation

## 2021-12-28 DIAGNOSIS — E118 Type 2 diabetes mellitus with unspecified complications: Secondary | ICD-10-CM | POA: Insufficient documentation

## 2021-12-28 LAB — LIPID PANEL
Cholesterol: 163 mg/dL (ref 0–200)
HDL: 49 mg/dL (ref >40)
LDL Cholesterol: 92 mg/dL (ref 0–99)
Total CHOL/HDL Ratio: 3.3 RATIO
Triglycerides: 111 mg/dL (ref <150)
VLDL: 22 mg/dL (ref 0–40)

## 2021-12-28 LAB — HEPATIC FUNCTION PANEL
ALT: 30 U/L (ref 0–44)
AST: 29 U/L (ref 15–41)
Albumin: 3.8 g/dL (ref 3.5–5.0)
Alkaline Phosphatase: 60 U/L (ref 38–126)
Bilirubin, Direct: 0.1 mg/dL (ref 0.0–0.2)
Indirect Bilirubin: 0.4 mg/dL (ref 0.3–0.9)
Total Bilirubin: 0.5 mg/dL (ref 0.3–1.2)
Total Protein: 7.4 g/dL (ref 6.5–8.1)

## 2021-12-28 LAB — TSH: TSH: 8.604 u[IU]/mL — ABNORMAL HIGH (ref 0.350–4.500)

## 2021-12-28 LAB — HEMOGLOBIN A1C
Hgb A1c MFr Bld: 6.2 % — ABNORMAL HIGH (ref 4.8–5.6)
Mean Plasma Glucose: 131.24 mg/dL

## 2021-12-31 ENCOUNTER — Encounter: Payer: Self-pay | Admitting: Physician Assistant

## 2021-12-31 ENCOUNTER — Ambulatory Visit: Payer: Self-pay | Admitting: Physician Assistant

## 2021-12-31 VITALS — BP 150/80 | HR 83 | Temp 97.6°F | Ht 67.0 in | Wt 180.0 lb

## 2021-12-31 DIAGNOSIS — I10 Essential (primary) hypertension: Secondary | ICD-10-CM

## 2021-12-31 DIAGNOSIS — E039 Hypothyroidism, unspecified: Secondary | ICD-10-CM

## 2021-12-31 DIAGNOSIS — E785 Hyperlipidemia, unspecified: Secondary | ICD-10-CM

## 2021-12-31 DIAGNOSIS — S90821A Blister (nonthermal), right foot, initial encounter: Secondary | ICD-10-CM

## 2021-12-31 DIAGNOSIS — N189 Chronic kidney disease, unspecified: Secondary | ICD-10-CM

## 2021-12-31 DIAGNOSIS — E118 Type 2 diabetes mellitus with unspecified complications: Secondary | ICD-10-CM

## 2021-12-31 NOTE — Progress Notes (Signed)
BP (!) 150/80   Pulse 83   Temp 97.6 F (36.4 C)   Ht 5\' 7"  (1.702 m)   Wt 180 lb (81.6 kg)   LMP 04/26/2016   SpO2 98%   BMI 28.19 kg/m    Subjective:    Patient ID: Sandra Parsons, female    DOB: January 22, 1971, 51 y.o.   MRN: [id number]  HPI: Sandra Parsons is a 51 y.o. female presenting on 12/31/2021 for Diabetes, Hypertension, Hyperlipidemia, and Sore (Pt states she has a painful sore on the medial aspect of her upper plantar. Pt noticed it about 4 days ago where it looked like a blister.)   HPI  Chief Complaint  Patient presents with   Diabetes   Hypertension   Hyperlipidemia   Sore    Pt states she has a painful sore on the medial aspect of her upper plantar. Pt noticed it about 4 days ago where it looked like a blister.     She went to nephrologist.  He is trying to get her on farxiga.  She says her Right ankle was swelled last week but is better now.  She has a Sore on right foot started about 4 days ago.  Her cafa active until 01/10/22.    She cancelled her GI appt in august and didn't r/s.  She saw ortho but isn't scheduled to return.  She also forgot to call back [facility] who called her to schedule mammogram.      Relevant past medical, surgical, family and social history reviewed and updated as indicated. Interim medical history since our last visit reviewed. Allergies and medications reviewed and updated.    Current Outpatient Medications:    atorvastatin (LIPITOR) 20 MG tablet, TAKE 1 Tablet BY MOUTH ONCE EVERY DAY, Disp: 90 tablet, Rfl: 1   insulin glargine (LANTUS SOLOSTAR) 100 UNIT/ML Solostar Pen, Inject 25 Units into the skin daily., Disp: 15 mL, Rfl: 0   JANUMET 50-500 MG tablet, TAKE 1 Tablet  BY MOUTH TWICE DAILY WITH A MEAL, Disp: 180 tablet, Rfl: 1   levothyroxine (SYNTHROID) 50 MCG tablet, TAKE 1 Tablet BY MOUTH ONCE EVERY DAY, Disp: 90 tablet, Rfl: 1   lisinopril (ZESTRIL) 30 MG tablet, Take 30 mg by mouth daily., Disp: , Rfl:     gabapentin (NEURONTIN) 100 MG capsule, Take 1 capsule (100 mg total) by mouth 2 (two) times daily as needed. (Patient not taking: Reported on 12/31/2021), Disp: 60 capsule, Rfl: 0   methocarbamol (ROBAXIN) 500 MG tablet, Take 1 tablet (500 mg total) by mouth 2 (two) times daily. (Patient not taking: Reported on 12/31/2021), Disp: 20 tablet, Rfl: 0   triamcinolone (KENALOG) 0.025 % ointment, Apply 1 application topically at bedtime as needed. (Patient not taking: Reported on 12/31/2021), Disp: 15 g, Rfl: 1    Review of Systems  Per HPI unless specifically indicated above     Objective:    BP (!) 150/80   Pulse 83   Temp 97.6 F (36.4 C)   Ht 5\' 7"  (1.702 m)   Wt 180 lb (81.6 kg)   LMP 04/26/2016   SpO2 98%   BMI 28.19 kg/m   Wt Readings from Last 3 Encounters:  12/31/21 180 lb (81.6 kg)  11/30/21 175 lb (79.4 kg)  11/27/21 175 lb (79.4 kg)    Physical Exam Vitals reviewed.  Constitutional:      Appearance: She is well-developed.  HENT:     Head: Normocephalic and atraumatic.  Cardiovascular:  Rate and Rhythm: Normal rate and regular rhythm.  Pulmonary:     Effort: Pulmonary effort is normal.     Breath sounds: Normal breath sounds.  Abdominal:     General: Bowel sounds are normal.     Palpations: Abdomen is soft. There is no mass.     Tenderness: There is no abdominal tenderness.  Musculoskeletal:     Cervical back: Neck supple.     Right lower leg: No edema.     Left lower leg: No edema.  Feet:     Comments: Right foot with healing blister and some callus on medial aspect MTP joint.  No redness or drainage.  Lymphadenopathy:     Cervical: No cervical adenopathy.  Skin:    General: Skin is warm and dry.  Neurological:     Mental Status: She is alert and oriented to person, place, and time.  Psychiatric:        Behavior: Behavior normal.     Results for orders placed or performed during the hospital encounter of 12/28/21  TSH  Result Value Ref Range    TSH 8.604 (H) 0.350 - 4.500 uIU/mL  Hepatic function panel  Result Value Ref Range   Total Protein 7.4 6.5 - 8.1 g/dL   Albumin 3.8 3.5 - 5.0 g/dL   AST 29 15 - 41 U/L   ALT 30 0 - 44 U/L   Alkaline Phosphatase 60 38 - 126 U/L   Total Bilirubin 0.5 0.3 - 1.2 mg/dL   Bilirubin, Direct 0.1 0.0 - 0.2 mg/dL   Indirect Bilirubin 0.4 0.3 - 0.9 mg/dL  Lipid panel  Result Value Ref Range   Cholesterol 163 0 - 200 mg/dL   Triglycerides 111 <150 mg/dL   HDL 49 >40 mg/dL   Total CHOL/HDL Ratio 3.3 RATIO   VLDL 22 0 - 40 mg/dL   LDL Cholesterol 92 0 - 99 mg/dL  Hemoglobin A1c  Result Value Ref Range   Hgb A1c MFr Bld 6.2 (H) 4.8 - 5.6 %   Mean Plasma Glucose 131.24 mg/dL      Assessment & Plan:   Encounter Diagnoses  Name Primary?   Controlled diabetes mellitus type 2 with complications, unspecified whether long term insulin use ([place]) Yes   Essential hypertension    Hyperlipidemia, unspecified hyperlipidemia type    Hypothyroidism, unspecified type    Chronic kidney disease, unspecified CKD stage    Blister of right foot, initial encounter       -reviewed labs with pt -pt was counseled on foot care.  Recommended thicker socks.  Squishy (thick padded) bandage applied. -no medication changes today -pt encouraged to return the 2 calls discussed above (GI and [facility]) -pt to follow up 1 month to recheck bp.  She is to contact office sooner for any problems with her foot or other issues

## 2021-12-31 NOTE — Patient Instructions (Addendum)
Call GI/gastroenterology- [phone] Call mammogram/[facility]- [phone]

## 2022-01-15 LAB — UPEP/UIFE/LIGHT CHAINS/TP, 24-HR UR
% BETA, Urine: 16.2 %
ALPHA 1 URINE: 5.5 %
Albumin, U: 56.4 %
Alpha 2, Urine: 7.5 %
Free Kappa Lt Chains,Ur: 250.5 mg/L — ABNORMAL HIGH (ref 1.17–86.46)
Free Kappa/Lambda Ratio: 7.9 (ref 1.83–14.26)
Free Lambda Lt Chains,Ur: 31.7 mg/L — ABNORMAL HIGH (ref 0.27–15.21)
GAMMA GLOBULIN URINE: 14.4 %
Total Protein, Urine-Ur/day: 817 mg/24 hr — ABNORMAL HIGH (ref 30–150)
Total Protein, Urine: 52.4 mg/dL

## 2022-01-18 ENCOUNTER — Other Ambulatory Visit: Payer: Self-pay | Admitting: Physician Assistant

## 2022-01-18 DIAGNOSIS — Z1231 Encounter for screening mammogram for malignant neoplasm of breast: Secondary | ICD-10-CM

## 2022-02-01 ENCOUNTER — Ambulatory Visit (HOSPITAL_COMMUNITY)
Admission: RE | Admit: 2022-02-01 | Discharge: 2022-02-01 | Disposition: A | Discharge status: Home / Self Care | Payer: Self-pay | Source: Ambulatory Visit | Attending: Physician Assistant | Admitting: Physician Assistant

## 2022-02-01 DIAGNOSIS — Z1231 Encounter for screening mammogram for malignant neoplasm of breast: Secondary | ICD-10-CM | POA: Insufficient documentation

## 2022-02-03 ENCOUNTER — Encounter: Payer: Self-pay | Admitting: Physician Assistant

## 2022-02-03 ENCOUNTER — Ambulatory Visit: Payer: Self-pay | Admitting: Physician Assistant

## 2022-02-03 VITALS — BP 120/66 | HR 79 | Temp 97.7°F | Ht 67.0 in | Wt 176.6 lb

## 2022-02-03 DIAGNOSIS — I10 Essential (primary) hypertension: Secondary | ICD-10-CM

## 2022-02-03 NOTE — Progress Notes (Signed)
BP 120/66   Pulse 79   Temp 97.7 F (36.5 C)   Ht 5\' 7"  (1.702 m)   Wt 176 lb 9.6 oz (80.1 kg)   LMP 04/26/2016   SpO2 99%   BMI 27.66 kg/m    Subjective:    Patient ID: 06/24/2016, female    DOB: 1970/08/17, 51 y.o.   MRN: [id number]  HPI: Sandra Parsons is a 51 y.o. female presenting on 02/03/2022 for Hypertension (/)   HPI   Chief Complaint  Patient presents with   Hypertension         She is feeling well and has no complaints.  She is not rescheduled with GI.  She says she needs to update her [employer] financial assistance    Relevant past medical, surgical, family and social history reviewed and updated as indicated. Interim medical history since our last visit reviewed. Allergies and medications reviewed and updated.   Current Outpatient Medications:    atorvastatin (LIPITOR) 20 MG tablet, TAKE 1 Tablet BY MOUTH ONCE EVERY DAY, Disp: 90 tablet, Rfl: 1   gabapentin (NEURONTIN) 100 MG capsule, Take 1 capsule (100 mg total) by mouth 2 (two) times daily as needed., Disp: 60 capsule, Rfl: 0   insulin glargine (LANTUS SOLOSTAR) 100 UNIT/ML Solostar Pen, Inject 25 Units into the skin daily., Disp: 15 mL, Rfl: 0   JANUMET 50-500 MG tablet, TAKE 1 Tablet  BY MOUTH TWICE DAILY WITH A MEAL, Disp: 180 tablet, Rfl: 1   lisinopril (ZESTRIL) 30 MG tablet, Take 30 mg by mouth daily., Disp: , Rfl:    triamcinolone (KENALOG) 0.025 % ointment, Apply 1 application topically at bedtime as needed., Disp: 15 g, Rfl: 1   levothyroxine (SYNTHROID) 50 MCG tablet, TAKE 1 Tablet BY MOUTH ONCE EVERY DAY, Disp: 90 tablet, Rfl: 1   methocarbamol (ROBAXIN) 500 MG tablet, Take 1 tablet (500 mg total) by mouth 2 (two) times daily. (Patient not taking: Reported on 12/31/2021), Disp: 20 tablet, Rfl: 0    Review of Systems  Per HPI unless specifically indicated above     Objective:    BP 120/66   Pulse 79   Temp 97.7 F (36.5 C)   Ht 5\' 7"  (1.702 m)   Wt 176 lb 9.6 oz (80.1  kg)   LMP 04/26/2016   SpO2 99%   BMI 27.66 kg/m   Wt Readings from Last 3 Encounters:  02/03/22 176 lb 9.6 oz (80.1 kg)  12/31/21 180 lb (81.6 kg)  11/30/21 175 lb (79.4 kg)    Physical Exam Vitals reviewed.  Constitutional:      General: She is not in acute distress.    Appearance: She is well-developed. She is not toxic-appearing.  HENT:     Head: Normocephalic and atraumatic.  Cardiovascular:     Rate and Rhythm: Normal rate and regular rhythm.  Pulmonary:     Effort: Pulmonary effort is normal.     Breath sounds: Normal breath sounds.  Abdominal:     General: Bowel sounds are normal.     Palpations: Abdomen is soft. There is no mass.     Tenderness: There is no abdominal tenderness.  Musculoskeletal:     Cervical back: Neck supple.     Right lower leg: No edema.     Left lower leg: No edema.  Lymphadenopathy:     Cervical: No cervical adenopathy.  Skin:    General: Skin is warm and dry.  Neurological:     Mental  Status: She is alert and oriented to person, place, and time.  Psychiatric:        Behavior: Behavior normal.           Assessment & Plan:   Encounter Diagnosis  Name Primary?   Essential hypertension Yes     -BP great today! -pt to continue current meds -she has follow up with nephrology in December -she is given cafa to work on and submit -she is encouraged to reschedule her appointment with GI -she will follow up here 3 months.  She is to contact office sooner prn

## 2022-03-02 ENCOUNTER — Other Ambulatory Visit: Payer: Self-pay | Admitting: Physician Assistant

## 2022-04-07 ENCOUNTER — Other Ambulatory Visit (HOSPITAL_COMMUNITY)
Admission: RE | Admit: 2022-04-07 | Discharge: 2022-04-07 | Disposition: A | Discharge status: Home / Self Care | Payer: Self-pay | Source: Ambulatory Visit | Attending: Nephrology | Admitting: Nephrology

## 2022-04-07 DIAGNOSIS — N189 Chronic kidney disease, unspecified: Secondary | ICD-10-CM | POA: Insufficient documentation

## 2022-04-07 LAB — RENAL FUNCTION PANEL
Albumin: 3.8 g/dL (ref 3.5–5.0)
Anion gap: 8 (ref 5–15)
BUN: 38 mg/dL — ABNORMAL HIGH (ref 6–20)
CO2: 21 mmol/L — ABNORMAL LOW (ref 22–32)
Calcium: 8.9 mg/dL (ref 8.9–10.3)
Chloride: 110 mmol/L (ref 98–111)
Creatinine, Ser: 1.67 mg/dL — ABNORMAL HIGH (ref 0.44–1.00)
GFR, Estimated: 37 mL/min — ABNORMAL LOW (ref >60)
Glucose, Bld: 114 mg/dL — ABNORMAL HIGH (ref 70–99)
Phosphorus: 4 mg/dL (ref 2.5–4.6)
Potassium: 4.4 mmol/L (ref 3.5–5.1)
Sodium: 139 mmol/L (ref 135–145)

## 2022-04-07 LAB — CBC
HCT: 27.1 % — ABNORMAL LOW (ref 36.0–46.0)
Hemoglobin: 9.1 g/dL — ABNORMAL LOW (ref 12.0–15.0)
MCH: 32.3 pg (ref 26.0–34.0)
MCHC: 33.6 g/dL (ref 30.0–36.0)
MCV: 96.1 fL (ref 80.0–100.0)
Platelets: 206 10*3/uL (ref 150–400)
RBC: 2.82 MIL/uL — ABNORMAL LOW (ref 3.87–5.11)
RDW: 12.4 % (ref 11.5–15.5)
WBC: 6.1 10*3/uL (ref 4.0–10.5)
nRBC: 0 % (ref 0.0–0.2)

## 2022-04-07 LAB — PROTEIN / CREATININE RATIO, URINE
Creatinine, Urine: 306.27 mg/dL
Protein Creatinine Ratio: 0.33 mg/mg{Cre} — ABNORMAL HIGH (ref 0.00–0.15)
Total Protein, Urine: 102 mg/dL

## 2022-04-21 ENCOUNTER — Other Ambulatory Visit (HOSPITAL_COMMUNITY)
Admission: RE | Admit: 2022-04-21 | Discharge: 2022-04-21 | Disposition: A | Discharge status: Home / Self Care | Payer: Self-pay | Source: Ambulatory Visit | Attending: Physician Assistant | Admitting: Physician Assistant

## 2022-04-21 ENCOUNTER — Other Ambulatory Visit: Payer: Self-pay | Admitting: Physician Assistant

## 2022-04-21 DIAGNOSIS — E118 Type 2 diabetes mellitus with unspecified complications: Secondary | ICD-10-CM | POA: Insufficient documentation

## 2022-04-21 DIAGNOSIS — I10 Essential (primary) hypertension: Secondary | ICD-10-CM

## 2022-04-21 DIAGNOSIS — E785 Hyperlipidemia, unspecified: Secondary | ICD-10-CM | POA: Insufficient documentation

## 2022-04-21 DIAGNOSIS — N189 Chronic kidney disease, unspecified: Secondary | ICD-10-CM

## 2022-04-21 DIAGNOSIS — E039 Hypothyroidism, unspecified: Secondary | ICD-10-CM | POA: Insufficient documentation

## 2022-04-21 LAB — TSH: TSH: 20.384 u[IU]/mL — ABNORMAL HIGH (ref 0.350–4.500)

## 2022-04-21 LAB — HEMOGLOBIN A1C
Hgb A1c MFr Bld: 6.3 % — ABNORMAL HIGH (ref 4.8–5.6)
Mean Plasma Glucose: 134.11 mg/dL

## 2022-04-21 LAB — LIPID PANEL
Cholesterol: 151 mg/dL (ref 0–200)
HDL: 53 mg/dL (ref >40)
LDL Cholesterol: 80 mg/dL (ref 0–99)
Total CHOL/HDL Ratio: 2.8 RATIO
Triglycerides: 89 mg/dL (ref <150)
VLDL: 18 mg/dL (ref 0–40)

## 2022-04-21 LAB — HEPATIC FUNCTION PANEL
ALT: 33 U/L (ref 0–44)
AST: 37 U/L (ref 15–41)
Albumin: 4.3 g/dL (ref 3.5–5.0)
Alkaline Phosphatase: 61 U/L (ref 38–126)
Bilirubin, Direct: <0.1 mg/dL (ref 0.0–0.2)
Total Bilirubin: 0.5 mg/dL (ref 0.3–1.2)
Total Protein: 8 g/dL (ref 6.5–8.1)

## 2022-04-23 ENCOUNTER — Other Ambulatory Visit (HOSPITAL_COMMUNITY)
Admission: RE | Admit: 2022-04-23 | Discharge: 2022-04-23 | Disposition: A | Discharge status: Home / Self Care | Payer: Self-pay | Source: Ambulatory Visit | Attending: Nephrology | Admitting: Nephrology

## 2022-04-23 DIAGNOSIS — N189 Chronic kidney disease, unspecified: Secondary | ICD-10-CM | POA: Insufficient documentation

## 2022-04-23 DIAGNOSIS — E559 Vitamin D deficiency, unspecified: Secondary | ICD-10-CM | POA: Insufficient documentation

## 2022-04-23 DIAGNOSIS — I129 Hypertensive chronic kidney disease with stage 1 through stage 4 chronic kidney disease, or unspecified chronic kidney disease: Secondary | ICD-10-CM | POA: Insufficient documentation

## 2022-04-23 DIAGNOSIS — R809 Proteinuria, unspecified: Secondary | ICD-10-CM | POA: Insufficient documentation

## 2022-04-23 DIAGNOSIS — E1122 Type 2 diabetes mellitus with diabetic chronic kidney disease: Secondary | ICD-10-CM | POA: Insufficient documentation

## 2022-04-23 DIAGNOSIS — D631 Anemia in chronic kidney disease: Secondary | ICD-10-CM | POA: Insufficient documentation

## 2022-04-23 LAB — RENAL FUNCTION PANEL
Albumin: 3.9 g/dL (ref 3.5–5.0)
Anion gap: 7 (ref 5–15)
BUN: 39 mg/dL — ABNORMAL HIGH (ref 6–20)
CO2: 20 mmol/L — ABNORMAL LOW (ref 22–32)
Calcium: 8.9 mg/dL (ref 8.9–10.3)
Chloride: 111 mmol/L (ref 98–111)
Creatinine, Ser: 2.12 mg/dL — ABNORMAL HIGH (ref 0.44–1.00)
GFR, Estimated: 28 mL/min — ABNORMAL LOW (ref >60)
Glucose, Bld: 94 mg/dL (ref 70–99)
Phosphorus: 4.3 mg/dL (ref 2.5–4.6)
Potassium: 4.6 mmol/L (ref 3.5–5.1)
Sodium: 138 mmol/L (ref 135–145)

## 2022-04-23 LAB — IRON AND TIBC
Iron: 41 ug/dL (ref 28–170)
Saturation Ratios: 12 % (ref 10.4–31.8)
TIBC: 354 ug/dL (ref 250–450)
UIBC: 313 ug/dL

## 2022-04-23 LAB — CBC
HCT: 28 % — ABNORMAL LOW (ref 36.0–46.0)
Hemoglobin: 9.2 g/dL — ABNORMAL LOW (ref 12.0–15.0)
MCH: 31.6 pg (ref 26.0–34.0)
MCHC: 32.9 g/dL (ref 30.0–36.0)
MCV: 96.2 fL (ref 80.0–100.0)
Platelets: 227 10*3/uL (ref 150–400)
RBC: 2.91 MIL/uL — ABNORMAL LOW (ref 3.87–5.11)
RDW: 12.5 % (ref 11.5–15.5)
WBC: 7.7 10*3/uL (ref 4.0–10.5)
nRBC: 0 % (ref 0.0–0.2)

## 2022-04-23 LAB — PROTEIN / CREATININE RATIO, URINE
Creatinine, Urine: 343.17 mg/dL
Protein Creatinine Ratio: 0.25 mg/mg{Cre} — ABNORMAL HIGH (ref 0.00–0.15)
Total Protein, Urine: 87 mg/dL

## 2022-04-23 LAB — FERRITIN: Ferritin: 7 ng/mL — ABNORMAL LOW (ref 11–307)

## 2022-04-25 LAB — PTH, INTACT AND CALCIUM
Calcium, Total (PTH): 9.1 mg/dL (ref 8.7–10.2)
PTH: 30 pg/mL (ref 15–65)

## 2022-04-26 ENCOUNTER — Other Ambulatory Visit: Payer: Self-pay

## 2022-04-26 ENCOUNTER — Emergency Department (HOSPITAL_COMMUNITY): Payer: Self-pay

## 2022-04-26 ENCOUNTER — Emergency Department (HOSPITAL_COMMUNITY)
Admission: EM | Admit: 2022-04-26 | Discharge: 2022-04-26 | Disposition: A | Discharge status: Home / Self Care | Payer: Self-pay | Attending: Emergency Medicine | Admitting: Emergency Medicine

## 2022-04-26 DIAGNOSIS — N1832 Chronic kidney disease, stage 3b: Secondary | ICD-10-CM

## 2022-04-26 DIAGNOSIS — Z794 Long term (current) use of insulin: Secondary | ICD-10-CM | POA: Insufficient documentation

## 2022-04-26 DIAGNOSIS — R5383 Other fatigue: Secondary | ICD-10-CM | POA: Insufficient documentation

## 2022-04-26 DIAGNOSIS — Z7984 Long term (current) use of oral hypoglycemic drugs: Secondary | ICD-10-CM | POA: Insufficient documentation

## 2022-04-26 DIAGNOSIS — R002 Palpitations: Secondary | ICD-10-CM | POA: Insufficient documentation

## 2022-04-26 DIAGNOSIS — E039 Hypothyroidism, unspecified: Secondary | ICD-10-CM

## 2022-04-26 DIAGNOSIS — I129 Hypertensive chronic kidney disease with stage 1 through stage 4 chronic kidney disease, or unspecified chronic kidney disease: Secondary | ICD-10-CM | POA: Insufficient documentation

## 2022-04-26 DIAGNOSIS — N183 Chronic kidney disease, stage 3 unspecified: Secondary | ICD-10-CM | POA: Diagnosis present

## 2022-04-26 DIAGNOSIS — E1122 Type 2 diabetes mellitus with diabetic chronic kidney disease: Secondary | ICD-10-CM | POA: Insufficient documentation

## 2022-04-26 DIAGNOSIS — R0602 Shortness of breath: Secondary | ICD-10-CM | POA: Insufficient documentation

## 2022-04-26 DIAGNOSIS — D649 Anemia, unspecified: Secondary | ICD-10-CM

## 2022-04-26 DIAGNOSIS — Z20822 Contact with and (suspected) exposure to covid-19: Secondary | ICD-10-CM | POA: Insufficient documentation

## 2022-04-26 LAB — CBC WITH DIFFERENTIAL/PLATELET
Abs Immature Granulocytes: 0.02 10*3/uL (ref 0.00–0.07)
Basophils Absolute: 0.1 10*3/uL (ref 0.0–0.1)
Basophils Relative: 1 %
Eosinophils Absolute: 0.8 10*3/uL — ABNORMAL HIGH (ref 0.0–0.5)
Eosinophils Relative: 9 %
HCT: 29.1 % — ABNORMAL LOW (ref 36.0–46.0)
Hemoglobin: 9.5 g/dL — ABNORMAL LOW (ref 12.0–15.0)
Immature Granulocytes: 0 %
Lymphocytes Relative: 31 %
Lymphs Abs: 2.7 10*3/uL (ref 0.7–4.0)
MCH: 31.1 pg (ref 26.0–34.0)
MCHC: 32.6 g/dL (ref 30.0–36.0)
MCV: 95.4 fL (ref 80.0–100.0)
Monocytes Absolute: 0.7 10*3/uL (ref 0.1–1.0)
Monocytes Relative: 8 %
Neutro Abs: 4.3 10*3/uL (ref 1.7–7.7)
Neutrophils Relative %: 51 %
Platelets: 255 10*3/uL (ref 150–400)
RBC: 3.05 MIL/uL — ABNORMAL LOW (ref 3.87–5.11)
RDW: 12.8 % (ref 11.5–15.5)
WBC: 8.6 10*3/uL (ref 4.0–10.5)
nRBC: 0 % (ref 0.0–0.2)

## 2022-04-26 LAB — COMPREHENSIVE METABOLIC PANEL
ALT: 28 U/L (ref 0–44)
AST: 35 U/L (ref 15–41)
Albumin: 4.2 g/dL (ref 3.5–5.0)
Alkaline Phosphatase: 56 U/L (ref 38–126)
Anion gap: 9 (ref 5–15)
BUN: 43 mg/dL — ABNORMAL HIGH (ref 6–20)
CO2: 19 mmol/L — ABNORMAL LOW (ref 22–32)
Calcium: 9.1 mg/dL (ref 8.9–10.3)
Chloride: 109 mmol/L (ref 98–111)
Creatinine, Ser: 1.72 mg/dL — ABNORMAL HIGH (ref 0.44–1.00)
GFR, Estimated: 36 mL/min — ABNORMAL LOW (ref >60)
Glucose, Bld: 116 mg/dL — ABNORMAL HIGH (ref 70–99)
Potassium: 4.3 mmol/L (ref 3.5–5.1)
Sodium: 137 mmol/L (ref 135–145)
Total Bilirubin: 0.6 mg/dL (ref 0.3–1.2)
Total Protein: 8 g/dL (ref 6.5–8.1)

## 2022-04-26 LAB — URINALYSIS, ROUTINE W REFLEX MICROSCOPIC
Bilirubin Urine: NEGATIVE
Glucose, UA: NEGATIVE mg/dL
Ketones, ur: NEGATIVE mg/dL
Leukocytes,Ua: NEGATIVE
Nitrite: NEGATIVE
Protein, ur: 100 mg/dL — AB
Specific Gravity, Urine: 1.016 (ref 1.005–1.030)
pH: 6 (ref 5.0–8.0)

## 2022-04-26 LAB — MAGNESIUM: Magnesium: 1.8 mg/dL (ref 1.7–2.4)

## 2022-04-26 LAB — RESP PANEL BY RT-PCR (RSV, FLU A&B, COVID)  RVPGX2
Influenza A by PCR: NEGATIVE
Influenza B by PCR: NEGATIVE
Resp Syncytial Virus by PCR: NEGATIVE
SARS Coronavirus 2 by RT PCR: NEGATIVE

## 2022-04-26 LAB — TSH: TSH: 11.447 u[IU]/mL — ABNORMAL HIGH (ref 0.350–4.500)

## 2022-04-26 LAB — LACTIC ACID, PLASMA
Lactic Acid, Venous: 0.7 mmol/L (ref 0.5–1.9)
Lactic Acid, Venous: 0.9 mmol/L (ref 0.5–1.9)

## 2022-04-26 LAB — POC OCCULT BLOOD, ED: Fecal Occult Bld: NEGATIVE

## 2022-04-26 MED ORDER — FERROUS SULFATE 325 (65 FE) MG PO TABS: 325.0000 mg | ORAL_TABLET | Freq: Every day | ORAL | 1 refills | Status: DC

## 2022-04-26 MED ORDER — LEVOTHYROXINE SODIUM 75 MCG PO TABS: ORAL_TABLET | ORAL | 1 refills | Status: DC

## 2022-04-26 NOTE — ED Provider Notes (Signed)
[facility] Provider Note   CSN: [id number] Arrival date & time: 04/26/22  1647     History  Chief Complaint  Patient presents with   Abnormal Lab    Sandra Parsons is a 52 y.o. female.  Pt is a 52 yo female with pmhx significant for hypothyroidism, dm, hep b, hep c, htn, depression, hld, and ckd.  Pt has not been feeling well for the past week.  She has been feeling more sob and fatigued.  She has had palpitations.  She went to see Dr. Theador Hawthorne (nephrology) today and her labs are worsening.  Due to this, he asked that pt be admitted for kidney biopsy, gi consult, and iv iron, and eval or heart palpitations.         Home Medications Prior to Admission medications   Medication Sig Start Date End Date Taking? Authorizing Provider  atorvastatin (LIPITOR) 20 MG tablet TAKE 1 Tablet BY MOUTH ONCE EVERY DAY Patient taking differently: Take 20 mg by mouth daily. 03/02/22  Yes Soyla Dryer, PA-C  ferrous sulfate 325 (65 FE) MG tablet Take 1 tablet (325 mg total) by mouth daily. 04/26/22 04/26/23 Yes Emokpae, Ejiroghene E, MD  gabapentin (NEURONTIN) 100 MG capsule Take 1 capsule (100 mg total) by mouth 2 (two) times daily as needed. 02/24/21  Yes Soyla Dryer, PA-C  hydrALAZINE (APRESOLINE) 25 MG tablet Take 25 mg by mouth 2 (two) times daily. 04/14/22 04/14/23 Yes [provider]  insulin glargine (LANTUS SOLOSTAR) 100 UNIT/ML Solostar Pen Inject 25 Units into the skin daily. 07/23/20  Yes Soyla Dryer, PA-C  JANUMET 50-500 MG tablet TAKE 1 Tablet  BY MOUTH TWICE DAILY WITH A MEAL 03/02/22  Yes Soyla Dryer, PA-C  triamcinolone (KENALOG) 0.025 % ointment Apply 1 application topically at bedtime as needed. 06/14/19  Yes Soyla Dryer, PA-C  dapagliflozin propanediol (FARXIGA) 5 MG TABS tablet Take by mouth. Patient not taking: Reported on 04/26/2022 12/04/21 12/04/22  [provider]  levothyroxine (SYNTHROID) 75 MCG tablet  TAKE 1 Tablet BY MOUTH ONCE EVERY DAY 04/26/22   Emokpae, Ejiroghene E, MD  methocarbamol (ROBAXIN) 500 MG tablet Take 1 tablet (500 mg total) by mouth 2 (two) times daily. Patient not taking: Reported on 12/31/2021 11/27/21   Prosperi, Christian H, PA-C      Allergies    Prednisone    Review of Systems   Review of Systems  Constitutional:  Positive for fatigue.  Respiratory:  Positive for shortness of breath.   Cardiovascular:  Positive for palpitations.  All other systems reviewed and are negative.   Physical Exam Updated Vital Signs BP (!) 149/68   Pulse 75   Temp 97.9 F (36.6 C) (Oral)   Resp 13   Wt 81.6 kg   LMP 04/26/2016   SpO2 99%   BMI 28.19 kg/m  Physical Exam Vitals and nursing note reviewed. Exam conducted with a chaperone present.  Constitutional:      Appearance: Normal appearance.  HENT:     Head: Normocephalic and atraumatic.     Right Ear: External ear normal.     Left Ear: External ear normal.     Nose: Nose normal.     Mouth/Throat:     Mouth: Mucous membranes are dry.  Eyes:     Extraocular Movements: Extraocular movements intact.     Conjunctiva/sclera: Conjunctivae normal.     Pupils: Pupils are equal, round, and reactive to light.  Cardiovascular:  Rate and Rhythm: Normal rate and regular rhythm.     Pulses: Normal pulses.     Heart sounds: Normal heart sounds.  Pulmonary:     Effort: Pulmonary effort is normal.     Breath sounds: Normal breath sounds.  Abdominal:     General: Abdomen is flat. Bowel sounds are normal.     Palpations: Abdomen is soft.  Genitourinary:    Rectum: Guaiac result negative.  Musculoskeletal:        General: Normal range of motion.     Cervical back: Normal range of motion and neck supple.  Skin:    General: Skin is warm.     Capillary Refill: Capillary refill takes less than 2 seconds.  Neurological:     General: No focal deficit present.     Mental Status: She is alert and oriented to person, place, and  time.  Psychiatric:        Mood and Affect: Mood normal.        Behavior: Behavior normal.     ED Results / Procedures / Treatments   Labs (all labs ordered are listed, but only abnormal results are displayed) Labs Reviewed  COMPREHENSIVE METABOLIC PANEL - Abnormal; Notable for the following components:      Result Value   CO2 19 (*)    Glucose, Bld 116 (*)    BUN 43 (*)    Creatinine, Ser 1.72 (*)    GFR, Estimated 36 (*)    All other components within normal limits  CBC WITH DIFFERENTIAL/PLATELET - Abnormal; Notable for the following components:   RBC 3.05 (*)    Hemoglobin 9.5 (*)    HCT 29.1 (*)    Eosinophils Absolute 0.8 (*)    All other components within normal limits  URINALYSIS, ROUTINE W REFLEX MICROSCOPIC - Abnormal; Notable for the following components:   Hgb urine dipstick SMALL (*)    Protein, ur 100 (*)    Bacteria, UA FEW (*)    All other components within normal limits  TSH - Abnormal; Notable for the following components:   TSH 11.447 (*)    All other components within normal limits  RESP PANEL BY RT-PCR (RSV, FLU A&B, COVID)  RVPGX2  LACTIC ACID, PLASMA  LACTIC ACID, PLASMA  MAGNESIUM  POC OCCULT BLOOD, ED    EKG EKG Interpretation  Date/Time:  Monday April 26 2022 17:37:41 EST Ventricular Rate:  75 PR Interval:  178 QRS Duration: 93 QT Interval:  379 QTC Calculation: 424 R Axis:   -29 Text Interpretation: Sinus rhythm Inferior infarct, old No significant change since last tracing Confirmed by Isla Pence [phone]) on 04/26/2022 7:02:43 PM  Radiology DG Chest Portable 1 View  Result Date: 04/26/2022 CLINICAL DATA:  One-week history of shortness of breath EXAM: PORTABLE CHEST 1 VIEW COMPARISON:  Chest radiograph dated 05/17/2016 FINDINGS: Normal lung volumes. No focal consolidations. No pleural effusion or pneumothorax. The heart size and mediastinal contours are within normal limits. The visualized skeletal structures are unremarkable.  IMPRESSION: No active disease. Electronically Signed   By: Darrin Nipper M.D.   On: 04/26/2022 18:18    Procedures Procedures    Medications Ordered in ED Medications - No data to display  ED Course/ Medical Decision Making/ A&P                             Medical Decision Making Amount and/or Complexity of Data Reviewed Labs: ordered. Radiology:  ordered.  Risk Decision regarding hospitalization.   This patient presents to the ED for concern of sob, this involves an extensive number of treatment options, and is a complaint that carries with it a high risk of complications and morbidity.  The differential diagnosis includes anemia, electrolyte abn, worsening kidney function   Co morbidities that complicate the patient evaluation   hypothyroidism, dm, hep b, hep c, htn, depression, hld, and ckd   Additional history obtained:  Additional history obtained from epic chart review External records from outside source obtained and reviewed including family   Lab Tests:  I Ordered, and personally interpreted labs.  The pertinent results include:  cbc with hgb 9.5 (9/2 on 2/2), cmp with bun 43 and cr 1.72 (2.12 on 2/2), CO2 19;  lactic 0.7; tsh 11.447, covid/flu/rsv neg   Imaging Studies ordered:  I ordered imaging studies including cxr  I independently visualized and interpreted imaging which showed No active disease.  I agree with the radiologist interpretation   Cardiac Monitoring:  The patient was maintained on a cardiac monitor.  I personally viewed and interpreted the cardiac monitored which showed an underlying rhythm of: nsr   Medicines ordered and prescription drug management:   I have reviewed the patients home medicines and have made adjustments as needed   Consultations Obtained:  I requested consultation with the hospitalist (Dr. Denton Brick),  and discussed lab and imaging findings as well as pertinent plan -she did consult on patient.  She does not think pt  meets admission criteria.  She did increase pt's synthroid.     Problem List / ED Course:  Symptomatic anemia:  no evidence of gi bleed.  Likely due to ckd.  Pt recommended by the hospitalist to take iron CKD: kidney bx needed, but can be done as an outpatient  Reevaluation:  After the interventions noted above, I reevaluated the patient and found that they have :improved   Social Determinants of Health:  Lives at home   Dispostion:  After consideration of the diagnostic results and the patients response to treatment, I feel that the patent would benefit from discharge.         Final Clinical Impression(s) / ED Diagnoses Final diagnoses:  Symptomatic anemia  Stage 3b chronic kidney disease ([place])  Hypothyroidism, unspecified type  Palpitations    Rx / DC Orders ED Discharge Orders          Ordered    levothyroxine (SYNTHROID) 75 MCG tablet        04/26/22 2149    ferrous sulfate 325 (65 FE) MG tablet  Daily        04/26/22 2149              Isla Pence, MD 04/26/22 2206

## 2022-04-26 NOTE — ED Triage Notes (Signed)
Pt sent from nephrology office today for abnormal labwork - Crt 2.12 and Hg 9.2. Pt states she has hx of CKD III, and has been feeling sob x 1wk. States she is losing blood through her kidneys

## 2022-04-26 NOTE — Discharge Instructions (Addendum)
One of your thyroid numbers is elevated, as you have been taking your synthroid, the dose will be increased to 75mg  daily. You will need to follow up with your doctor as an out patient to have your numbers checked again.  You will also need to be on iron pills- take one tablet - Ferrous Sulphate- 325mg  every day. Pls follow up with your kidney and primary care providers.

## 2022-04-26 NOTE — Assessment & Plan Note (Signed)
TSH elevated at 11.44.  She tells me she has been compliant with 50 mg of Synthroid daily.  She has been taking the same dose over the past several months. - Increase Synthroid to 75 mg daily, follow-up closely with outpatient provider

## 2022-04-26 NOTE — Assessment & Plan Note (Addendum)
Cr today- 1.7.  CKD stage IIIb.  Last checked at nephrology office 3 days ago- 2/2-was elevated at 2.12.  Slow gradual increase in creatinine over the past 8 months 1.1 > 1.3 > 1.6- 2 weeks ago.  At this time appears to be more of a progression of her chronic kidney disease likely from diabetes as she also has diabetic retinopathy. -Was referred to the ED by nephrologist Dr. Theador Hawthorne due to worsening renal function, but today her renal function has improved, with GFR of 36.  She is currently not meeting admission criteria. -I have reached out to Dr. Theador Hawthorne and I am awaiting response.  If no response she will be discharged home to follow up closely with her outpatient providers nephrology and possibly GI if needed. -She confirms she is no longer taking lisinopril

## 2022-04-26 NOTE — Assessment & Plan Note (Addendum)
Sent to the ED from nephrologist office for possible GI workup and iron infusion.  Hemoglobin 9.5, for the past 9 months hemoglobin has ranged from 9-10.  No history to suggest GI blood loss, and stool occult negative. - Recent anemia panel- 2/2-ferritin of 7, iron saturation, TIBC and serum iron WNL suggesting combination of iron deficiency anemia and anemia of chronic disease. -Recommend iron tablets once daily -Follow-up with outpatient GI -Reported irregular heartbeat by nephrology, EKG today showing normal sinus rhythm. -Chest x-ray is clear, and she is not hypoxic, no evidence of respiratory problems

## 2022-04-26 NOTE — Consult Note (Addendum)
Initial Consultation Note   Patient: Sandra Parsons HGD:[id number] DOB: 04/24/1970 PCP: Soyla Dryer, PA-C DOA: 04/26/2022 DOS: the patient was seen and examined on 04/26/2022 Primary service: Isla Pence, MD  Referring physician: Nephrology- Dr. Theador Hawthorne  Reason for consult: Worsening renal function  Assessment and Plan: * CKD (chronic kidney disease) stage 3, GFR 30-59 ml/min (HCC) Cr today- 1.7.  CKD stage IIIb.  Last checked at nephrology office 3 days ago- 2/2-was elevated at 2.12.  Slow gradual increase in creatinine over the past 8 months 1.1 > 1.3 > 1.6- 2 weeks ago.  At this time appears to be more of a progression of her chronic kidney disease likely from diabetes as she also has diabetic retinopathy. -Was referred to the ED by nephrologist Dr. Theador Hawthorne due to worsening renal function, but today her renal function has improved, with GFR of 36.  She is currently not meeting admission criteria. -I have reached out to Dr. Theador Hawthorne and I am awaiting response.  If no response she will be discharged home to follow up closely with her outpatient providers nephrology and possibly GI if needed. -She confirms she is no longer taking lisinopril  Hypothyroidism TSH elevated at 11.44.  She tells me she has been compliant with 50 mg of Synthroid daily.  She has been taking the same dose over the past several months. - Increase Synthroid to 75 mg daily, follow-up closely with outpatient provider  Anemia Sent to the ED from nephrologist office for possible GI workup and iron infusion.  Hemoglobin 9.5, for the past 9 months hemoglobin has ranged from 9-10.  No history to suggest GI blood loss, and stool occult negative. - Recent anemia panel- 2/2-ferritin of 7, iron saturation, TIBC and serum iron WNL suggesting combination of iron deficiency anemia and anemia of chronic disease. -Recommend iron tablets once daily -Follow-up with outpatient GI -Reported irregular heartbeat by nephrology, EKG  today showing normal sinus rhythm. -Chest x-ray is clear, and she is not hypoxic, no evidence of respiratory problems    TR[facility] will sign off at present, please call us again when needed.  HPI: Sandra Parsons is a 52 y.o. female with past medical history of CKD stage III, anemia, diabetes mellitus, depression. Patient was referred to the ED from nephrologist office with reports of abnormal labs, creatinine of 2.1, hemoglobin of 9.3.  And reported difficulty breathing of 1 week. Patient reports good oral intake, no vomiting no loose stools.  She reports occasional episodes of palpitation.  She denies chest pain.  She reports occasional episodes of difficulty breathing that are transient and resolve spontaneously, with palpitations or feeling tired, over the past several weeks.  No lower extremity swelling.  Today she has no symptoms.   No black stools no blood in stools no vomiting of blood.  No abdominal pain.  She was on lisinopril but this was discontinued by her nephrologist.  ED course-temperature 97.9.  Heart rate 74-82.  Respiratory 13-20.  Blood pressure systolic 1 96-222.  O2 sat greater than 97% on room air. Lactic acid 0.7 TSH 11.447 Magnesium 1.8. Creatinine 1.72, hemoglobin 9.5. Chest x-ray negative for acute abnormality.  Review of Systems: As mentioned in the history of present illness. All other systems reviewed and are negative.  Past Medical History:  Diagnosis Date   CKD (chronic kidney disease)    Complication of anesthesia    Depression    Diabetes mellitus without complication (HCC)    Hepatitis B    per patient- diagnosed at  age 57   Hepatitis C    Hyperlipidemia    Hypertension    Not currently on BP meds   Pinched nerve    PONV (postoperative nausea and vomiting)    Thyroid disease    Past Surgical History:  Procedure Laterality Date   BACK SURGERY     x2   CARPAL TUNNEL RELEASE Right 12/08/2020   Procedure: CARPAL TUNNEL RELEASE;  Surgeon: Mordecai Rasmussen, MD;  Location: [facility];  Service: Orthopedics;  Laterality: Right;   CYSTECTOMY     L forearm   TRIGGER FINGER RELEASE Left 09/01/2020   Procedure: LEFT LONG FINGER  A-1 PULLEY RELEASE;  Surgeon: Mordecai Rasmussen, MD;  Location: [facility];  Service: Orthopedics;  Laterality: Left;  Left long finger trigger release   TRIGGER FINGER RELEASE Right 12/08/2020   Procedure: RELEASE TRIGGER FINGER/A-1 PULLEY;  Surgeon: Mordecai Rasmussen, MD;  Location: [facility];  Service: Orthopedics;  Laterality: Right;  Right index and long finger trigger release   Social History:  reports that she quit smoking about 3 years ago. Her smoking use included cigarettes. She has a 7.75 pack-year smoking history. She has never used smokeless tobacco. She reports current drug use. Drug: Marijuana. She reports that she does not drink alcohol.  Allergies  Allergen Reactions   Prednisone Other (See Comments)    Poor tolerance due to Diabetes "make my sugar go up" Other reaction(s): Other (see comments) Poor tolerance due to Diabetes "make my sugar go up"    Family History  Problem Relation Age of Onset   Cancer Mother        multiple myeloma   Diabetes Mother    Heart failure Father    Diabetes Maternal Grandmother    Colon cancer Neg Hx     Prior to Admission medications   Medication Sig Start Date End Date Taking? Authorizing Provider  atorvastatin (LIPITOR) 20 MG tablet TAKE 1 Tablet BY MOUTH ONCE EVERY DAY Patient taking differently: Take 20 mg by mouth daily. 03/02/22  Yes Soyla Dryer, PA-C  gabapentin (NEURONTIN) 100 MG capsule Take 1 capsule (100 mg total) by mouth 2 (two) times daily as needed. 02/24/21  Yes Soyla Dryer, PA-C  hydrALAZINE (APRESOLINE) 25 MG tablet Take 25 mg by mouth 2 (two) times daily. 04/14/22 04/14/23 Yes [provider]  insulin glargine (LANTUS SOLOSTAR) 100 UNIT/ML Solostar Pen Inject 25 Units into the skin daily. 07/23/20  Yes Soyla Dryer, PA-C  JANUMET 50-500 MG tablet  TAKE 1 Tablet  BY MOUTH TWICE DAILY WITH A MEAL 03/02/22  Yes Soyla Dryer, PA-C  levothyroxine (SYNTHROID) 50 MCG tablet TAKE 1 Tablet BY MOUTH ONCE EVERY DAY 03/02/22  Yes Soyla Dryer, PA-C  triamcinolone (KENALOG) 0.025 % ointment Apply 1 application topically at bedtime as needed. 06/14/19  Yes Soyla Dryer, PA-C  dapagliflozin propanediol (FARXIGA) 5 MG TABS tablet Take by mouth. Patient not taking: Reported on 04/26/2022 12/04/21 12/04/22  [provider]  methocarbamol (ROBAXIN) 500 MG tablet Take 1 tablet (500 mg total) by mouth 2 (two) times daily. Patient not taking: Reported on 12/31/2021 11/27/21   Anselmo Pickler, [place]    Physical Exam: Vitals:   04/26/22 1720 04/26/22 1721  BP:  (!) 177/80  Pulse:  82  Resp:  18  Temp:  97.9 F (36.6 C)  TempSrc:  Oral  SpO2:  99%  Weight: 81.6 kg    Constitutional: NAD, calm, comfortable Eyes: PERRL, lids and conjunctivae normal ENMT: Mucous  membranes are moist.  Neck: normal, supple, no masses, no thyromegaly Respiratory: clear to auscultation bilaterally, no wheezing, no crackles. Normal respiratory effort. No accessory muscle use.  Cardiovascular: Regular rate and rhythm, no murmurs / rubs / gallops. No extremity edema. Extremities warm. Abdomen: no tenderness, no masses palpated. No hepatosplenomegaly. Bowel sounds positive.  Musculoskeletal: no clubbing / cyanosis. No joint deformity upper and lower extremities.  Skin: no rashes, lesions, ulcers. No induration Neurologic: No apparent cranial nerve abnormality, moving extremity spontaneously.  Psychiatric: Normal judgment and insight. Alert and oriented x 3. Normal mood.    Family Communication: Niece Caryl Pina at bedside.  Primary team communication: ED provider Thank you very much for involving [place] in the care of your patient.  Author: Bethena Roys, MD 04/26/2022 9:59 PM  For on call review [email or web address].

## 2022-04-29 ENCOUNTER — Telehealth: Payer: Self-pay | Admitting: Pharmacy Technician

## 2022-04-29 NOTE — Telephone Encounter (Addendum)
Patient is un-insured and will need PAP (free drug) Awaiting call from patient to verify income and household size.  Auth Submission: APPROVED @AP$  Payer: Shirlean Kelly ASSIST - PAP Medication & CPT/J Code(s) submitted: Feraheme (ferumoxytol) [id number] Route of submission (phone, fax, portal):  Phone # [phone] Fax [phone] Medication will arrive 05/19/22 Attn: Mikey Kirschner  Forms have been faxed to MD office. Janice/Linda RN FAX: [phone] PHONE: [phone]

## 2022-05-04 ENCOUNTER — Telehealth: Payer: Self-pay

## 2022-05-04 ENCOUNTER — Other Ambulatory Visit: Payer: Self-pay

## 2022-05-04 NOTE — Telephone Encounter (Signed)
Made two (2) attempts to f/u with pt by phone and text message but pt was unavailable at the time of the call.     This call was in attempt to provide medical case management to ensure pt did not have any additional questions or concerns regarding any post hospital instruction,medical concerns, obtaining her medications and serving as a reminder of any upcoming appointments.    Will attempt to make additional phone contact, if pt does not attempt  to return call first.   Per review of epic notes, Pt is connected with her PCP @ [facility] and does have an upcoming follow up appointment on 2.14.24 in addition to a nephrology appointment at on 2.22.24 specialist appointment  with [facility]

## 2022-05-05 ENCOUNTER — Ambulatory Visit: Payer: Self-pay | Admitting: Physician Assistant

## 2022-05-05 ENCOUNTER — Encounter: Payer: Self-pay | Admitting: Physician Assistant

## 2022-05-05 VITALS — BP 161/77 | HR 82 | Temp 96.9°F | Ht 67.0 in | Wt 181.5 lb

## 2022-05-05 DIAGNOSIS — E785 Hyperlipidemia, unspecified: Secondary | ICD-10-CM

## 2022-05-05 DIAGNOSIS — E118 Type 2 diabetes mellitus with unspecified complications: Secondary | ICD-10-CM

## 2022-05-05 DIAGNOSIS — R195 Other fecal abnormalities: Secondary | ICD-10-CM

## 2022-05-05 DIAGNOSIS — N189 Chronic kidney disease, unspecified: Secondary | ICD-10-CM

## 2022-05-05 DIAGNOSIS — E039 Hypothyroidism, unspecified: Secondary | ICD-10-CM

## 2022-05-05 DIAGNOSIS — D649 Anemia, unspecified: Secondary | ICD-10-CM

## 2022-05-05 DIAGNOSIS — I10 Essential (primary) hypertension: Secondary | ICD-10-CM

## 2022-05-05 MED ORDER — AMLODIPINE BESYLATE 5 MG PO TABS: 5.0000 mg | ORAL_TABLET | Freq: Every day | ORAL | 1 refills | Status: DC

## 2022-05-05 MED ORDER — LEVOTHYROXINE SODIUM 75 MCG PO TABS: ORAL_TABLET | ORAL | 1 refills | Status: DC

## 2022-05-05 NOTE — Progress Notes (Signed)
BP (!) 161/77   Pulse 82   Temp (!) 96.9 F (36.1 C)   Ht 5' 7"$  (1.702 m)   Wt 181 lb 8 oz (82.3 kg)   LMP 04/26/2016   SpO2 97%   BMI 28.43 kg/m    Subjective:    Patient ID: Sandra Parsons, female    DOB: 04/09/1970, 52 y.o.   MRN: [id number]  HPI: Sandra Parsons is a 52 y.o. female presenting on 05/05/2022 for Diabetes, Hypothyroidism, and Hyperlipidemia   HPI   Chief Complaint  Patient presents with   Diabetes   Hypothyroidism   Hyperlipidemia    Pt is seeing nephrologist for CKD which worsened recently.  She says he wants her to get a renal biopsy. She says she got cafa- 75% She tried to get GI appt but says she was told she needs referral again Pt has appt to f/u with nephrology on  05/13/22 She says she is feeling okay but is somewhat stressed.  She continues to work; she says it keeps her from focusing on her health concerns.  She has no new issues today.      Relevant past medical, surgical, family and social history reviewed and updated as indicated. Interim medical history since our last visit reviewed. Allergies and medications reviewed and updated.     Current Outpatient Medications:    atorvastatin (LIPITOR) 20 MG tablet, TAKE 1 Tablet BY MOUTH ONCE EVERY DAY (Patient taking differently: Take 20 mg by mouth daily.), Disp: 90 tablet, Rfl: 1   gabapentin (NEURONTIN) 100 MG capsule, Take 1 capsule (100 mg total) by mouth 2 (two) times daily as needed., Disp: 60 capsule, Rfl: 0   hydrALAZINE (APRESOLINE) 25 MG tablet, Take 25 mg by mouth 2 (two) times daily., Disp: , Rfl:    insulin glargine (LANTUS SOLOSTAR) 100 UNIT/ML Solostar Pen, Inject 25 Units into the skin daily., Disp: 15 mL, Rfl: 0   JANUMET 50-500 MG tablet, TAKE 1 Tablet  BY MOUTH TWICE DAILY WITH A MEAL, Disp: 180 tablet, Rfl: 1   levothyroxine (SYNTHROID) 75 MCG tablet, TAKE 1 Tablet BY MOUTH ONCE EVERY DAY (Patient taking differently: Take 50 mcg by mouth daily. TAKE 1 Tablet BY MOUTH ONCE  EVERY DAY), Disp: 30 tablet, Rfl: 1   triamcinolone (KENALOG) 0.025 % ointment, Apply 1 application topically at bedtime as needed., Disp: 15 g, Rfl: 1   dapagliflozin propanediol (FARXIGA) 5 MG TABS tablet, Take by mouth. (Patient not taking: Reported on 04/26/2022), Disp: , Rfl:    ferrous sulfate 325 (65 FE) MG tablet, Take 1 tablet (325 mg total) by mouth daily. (Patient not taking: Reported on 05/05/2022), Disp: 30 tablet, Rfl: 1   methocarbamol (ROBAXIN) 500 MG tablet, Take 1 tablet (500 mg total) by mouth 2 (two) times daily. (Patient not taking: Reported on 12/31/2021), Disp: 20 tablet, Rfl: 0   Review of Systems  Per HPI unless specifically indicated above     Objective:    BP (!) 161/77   Pulse 82   Temp (!) 96.9 F (36.1 C)   Ht 5' 7"$  (1.702 m)   Wt 181 lb 8 oz (82.3 kg)   LMP 04/26/2016   SpO2 97%   BMI 28.43 kg/m   Wt Readings from Last 3 Encounters:  05/05/22 181 lb 8 oz (82.3 kg)  04/26/22 180 lb (81.6 kg)  02/03/22 176 lb 9.6 oz (80.1 kg)    Physical Exam Vitals reviewed.  Constitutional:      General: She  is not in acute distress.    Appearance: She is well-developed. She is not toxic-appearing.  HENT:     Head: Normocephalic and atraumatic.  Cardiovascular:     Rate and Rhythm: Normal rate and regular rhythm.  Pulmonary:     Effort: Pulmonary effort is normal.     Breath sounds: Normal breath sounds.  Abdominal:     General: Bowel sounds are normal.     Palpations: Abdomen is soft. There is no mass.     Tenderness: There is no abdominal tenderness.  Musculoskeletal:     Cervical back: Neck supple.     Right lower leg: No edema.     Left lower leg: No edema.  Lymphadenopathy:     Cervical: No cervical adenopathy.  Skin:    General: Skin is warm and dry.  Neurological:     Mental Status: She is alert and oriented to person, place, and time.  Psychiatric:        Attention and Perception: Attention normal.        Mood and Affect: Mood is anxious.         Speech: Speech normal.        Behavior: Behavior normal. Behavior is cooperative.            Assessment & Plan:   Encounter Diagnoses  Name Primary?   Essential hypertension Yes   Chronic kidney disease, unspecified CKD stage    Positive FIT (fecal immunochemical test)    Anemia, unspecified type    Controlled diabetes mellitus type 2 with complications, unspecified whether long term insulin use (HCC)    Hyperlipidemia, unspecified hyperlipidemia type    Hypothyroidism, unspecified type      -reviewed labs with pt  -pt to Start taking the 75 mcg levothyroxine -Add Amlodipine and f/u 1 week to recheck bp -re-refer to GI for anemia and + FIT -pt to continue with nephrologist per his recommendations -nurse checked on medassist status and information was re-sent (not active at this time) -pt was given another application for farxiga assistance.  She was given instructions on how to complete (nephrology office will need to fax it)

## 2022-05-07 ENCOUNTER — Other Ambulatory Visit (HOSPITAL_COMMUNITY)
Admission: RE | Admit: 2022-05-07 | Discharge: 2022-05-07 | Disposition: A | Discharge status: Home / Self Care | Payer: Self-pay | Source: Ambulatory Visit | Attending: Nephrology | Admitting: Nephrology

## 2022-05-07 DIAGNOSIS — N170 Acute kidney failure with tubular necrosis: Secondary | ICD-10-CM | POA: Insufficient documentation

## 2022-05-07 DIAGNOSIS — E1122 Type 2 diabetes mellitus with diabetic chronic kidney disease: Secondary | ICD-10-CM | POA: Insufficient documentation

## 2022-05-07 DIAGNOSIS — N189 Chronic kidney disease, unspecified: Secondary | ICD-10-CM | POA: Insufficient documentation

## 2022-05-07 DIAGNOSIS — R809 Proteinuria, unspecified: Secondary | ICD-10-CM | POA: Insufficient documentation

## 2022-05-07 DIAGNOSIS — E559 Vitamin D deficiency, unspecified: Secondary | ICD-10-CM | POA: Insufficient documentation

## 2022-05-07 DIAGNOSIS — D631 Anemia in chronic kidney disease: Secondary | ICD-10-CM | POA: Insufficient documentation

## 2022-05-07 LAB — PROTEIN / CREATININE RATIO, URINE
Creatinine, Urine: 256 mg/dL
Protein Creatinine Ratio: 0.75 mg/mg{Cre} — ABNORMAL HIGH (ref 0.00–0.15)
Total Protein, Urine: 192 mg/dL

## 2022-05-07 LAB — CBC
HCT: 27.9 % — ABNORMAL LOW (ref 36.0–46.0)
Hemoglobin: 9.3 g/dL — ABNORMAL LOW (ref 12.0–15.0)
MCH: 31.5 pg (ref 26.0–34.0)
MCHC: 33.3 g/dL (ref 30.0–36.0)
MCV: 94.6 fL (ref 80.0–100.0)
Platelets: 205 10*3/uL (ref 150–400)
RBC: 2.95 MIL/uL — ABNORMAL LOW (ref 3.87–5.11)
RDW: 12.8 % (ref 11.5–15.5)
WBC: 7.3 10*3/uL (ref 4.0–10.5)
nRBC: 0 % (ref 0.0–0.2)

## 2022-05-07 LAB — RENAL FUNCTION PANEL
Albumin: 4 g/dL (ref 3.5–5.0)
Anion gap: 8 (ref 5–15)
BUN: 29 mg/dL — ABNORMAL HIGH (ref 6–20)
CO2: 21 mmol/L — ABNORMAL LOW (ref 22–32)
Calcium: 9.1 mg/dL (ref 8.9–10.3)
Chloride: 109 mmol/L (ref 98–111)
Creatinine, Ser: 1.26 mg/dL — ABNORMAL HIGH (ref 0.44–1.00)
GFR, Estimated: 52 mL/min — ABNORMAL LOW (ref >60)
Glucose, Bld: 80 mg/dL (ref 70–99)
Phosphorus: 3.6 mg/dL (ref 2.5–4.6)
Potassium: 4.1 mmol/L (ref 3.5–5.1)
Sodium: 138 mmol/L (ref 135–145)

## 2022-05-07 LAB — IRON AND TIBC
Iron: 59 ug/dL (ref 28–170)
Saturation Ratios: 17 % (ref 10.4–31.8)
TIBC: 357 ug/dL (ref 250–450)
UIBC: 298 ug/dL

## 2022-05-07 LAB — FERRITIN: Ferritin: 8 ng/mL — ABNORMAL LOW (ref 11–307)

## 2022-05-09 LAB — PTH, INTACT AND CALCIUM
Calcium, Total (PTH): 9.1 mg/dL (ref 8.7–10.2)
PTH: 22 pg/mL (ref 15–65)

## 2022-05-12 ENCOUNTER — Ambulatory Visit: Payer: Self-pay | Admitting: Physician Assistant

## 2022-05-12 ENCOUNTER — Encounter: Payer: Self-pay | Admitting: Physician Assistant

## 2022-05-12 VITALS — BP 138/73 | HR 83 | Temp 96.3°F

## 2022-05-12 DIAGNOSIS — E118 Type 2 diabetes mellitus with unspecified complications: Secondary | ICD-10-CM

## 2022-05-12 DIAGNOSIS — D649 Anemia, unspecified: Secondary | ICD-10-CM

## 2022-05-12 DIAGNOSIS — N189 Chronic kidney disease, unspecified: Secondary | ICD-10-CM

## 2022-05-12 DIAGNOSIS — I10 Essential (primary) hypertension: Secondary | ICD-10-CM

## 2022-05-12 NOTE — Progress Notes (Signed)
BP 138/73   Pulse 83   Temp (!) 96.3 F (35.7 C)   LMP 04/26/2016   SpO2 97%    Subjective:    Patient ID: Sandra Parsons, female    DOB: [birth year], 52 y.o.   MRN: [id number]  HPI: Sandra Parsons is a 52 y.o. female presenting on 05/12/2022 for Hypertension   HPI  Chief Complaint  Patient presents with   Hypertension    Pt says she is feeling much better now.   She has no complaints today.  She has appointment to follow up with nephrologist tomorrow.    Relevant past medical, surgical, family and social history reviewed and updated as indicated. Interim medical history since our last visit reviewed. Allergies and medications reviewed and updated.    Current Outpatient Medications:    amLODipine (NORVASC) 5 MG tablet, Take 1 tablet (5 mg total) by mouth daily., Disp: 30 tablet, Rfl: 1   atorvastatin (LIPITOR) 20 MG tablet, TAKE 1 Tablet BY MOUTH ONCE EVERY DAY (Patient taking differently: Take 20 mg by mouth daily.), Disp: 90 tablet, Rfl: 1   dapagliflozin propanediol (FARXIGA) 5 MG TABS tablet, Take by mouth. (Patient not taking: Reported on 04/26/2022), Disp: , Rfl:    ferrous sulfate 325 (65 FE) MG tablet, Take 1 tablet (325 mg total) by mouth daily. (Patient not taking: Reported on 05/05/2022), Disp: 30 tablet, Rfl: 1   gabapentin (NEURONTIN) 100 MG capsule, Take 1 capsule (100 mg total) by mouth 2 (two) times daily as needed., Disp: 60 capsule, Rfl: 0   hydrALAZINE (APRESOLINE) 25 MG tablet, Take 25 mg by mouth 2 (two) times daily., Disp: , Rfl:    insulin glargine (LANTUS SOLOSTAR) 100 UNIT/ML Solostar Pen, Inject 25 Units into the skin daily., Disp: 15 mL, Rfl: 0   JANUMET 50-500 MG tablet, TAKE 1 Tablet  BY MOUTH TWICE DAILY WITH A MEAL, Disp: 180 tablet, Rfl: 1   levothyroxine (SYNTHROID) 75 MCG tablet, TAKE 1 Tablet BY MOUTH ONCE EVERY DAY, Disp: 30 tablet, Rfl: 1   methocarbamol (ROBAXIN) 500 MG tablet, Take 1 tablet (500 mg total) by mouth 2 (two) times daily.  (Patient not taking: Reported on 12/31/2021), Disp: 20 tablet, Rfl: 0   triamcinolone (KENALOG) 0.025 % ointment, Apply 1 application topically at bedtime as needed., Disp: 15 g, Rfl: 1    Review of Systems  Per HPI unless specifically indicated above     Objective:    BP 138/73   Pulse 83   Temp (!) 96.3 F (35.7 C)   LMP 04/26/2016   SpO2 97%   Wt Readings from Last 3 Encounters:  05/05/22 181 lb 8 oz (82.3 kg)  04/26/22 180 lb (81.6 kg)  02/03/22 176 lb 9.6 oz (80.1 kg)    Physical Exam Vitals reviewed.  Constitutional:      General: She is not in acute distress.    Appearance: She is well-developed. She is not toxic-appearing.  HENT:     Head: Normocephalic and atraumatic.  Cardiovascular:     Rate and Rhythm: Normal rate and regular rhythm.  Pulmonary:     Effort: Pulmonary effort is normal.     Breath sounds: Normal breath sounds.  Musculoskeletal:     Cervical back: Neck supple.     Right lower leg: No edema.     Left lower leg: No edema.  Lymphadenopathy:     Cervical: No cervical adenopathy.  Skin:    General: Skin is warm and dry.  Neurological:  Mental Status: She is alert and oriented to person, place, and time.  Psychiatric:        Behavior: Behavior normal.           Assessment & Plan:    Encounter Diagnoses  Name Primary?   Essential hypertension Yes   Chronic kidney disease, unspecified CKD stage    Controlled diabetes mellitus type 2 with complications, unspecified whether long term insulin use (HCC)    Anemia, unspecified type       -BP improved -Pt to continue current meds -pt to follow up 6 weeks.  She is to RTO sooner prn

## 2022-05-13 ENCOUNTER — Encounter (HOSPITAL_COMMUNITY): Payer: Self-pay | Admitting: Nephrology

## 2022-05-20 ENCOUNTER — Encounter: Payer: Self-pay | Admitting: Radiology

## 2022-05-20 ENCOUNTER — Encounter (HOSPITAL_COMMUNITY)
Admission: RE | Admit: 2022-05-20 | Discharge: 2022-05-20 | Disposition: A | Discharge status: Home / Self Care | Payer: Self-pay | Source: Ambulatory Visit | Attending: Nephrology | Admitting: Nephrology

## 2022-05-20 VITALS — BP 158/81 | HR 70 | Temp 98.2°F | Resp 16

## 2022-05-20 DIAGNOSIS — D649 Anemia, unspecified: Secondary | ICD-10-CM

## 2022-05-20 DIAGNOSIS — Z01818 Encounter for other preprocedural examination: Secondary | ICD-10-CM | POA: Insufficient documentation

## 2022-05-20 MED ORDER — ACETAMINOPHEN 325 MG PO TABS
650.0000 mg | ORAL_TABLET | Freq: Once | ORAL | Status: AC
  Administered 2022-05-20: 650 mg via ORAL

## 2022-05-20 MED ORDER — SODIUM CHLORIDE 0.9 % IV SOLN
510.0000 mg | Freq: Once | INTRAVENOUS | Status: AC
  Administered 2022-05-20: 510 mg via INTRAVENOUS
  Filled 2022-05-20 (×2): qty 17

## 2022-05-20 MED ORDER — DIPHENHYDRAMINE HCL 25 MG PO CAPS
25.0000 mg | ORAL_CAPSULE | Freq: Once | ORAL | Status: AC
  Administered 2022-05-20: 25 mg via ORAL

## 2022-05-20 NOTE — Progress Notes (Signed)
Diagnosis: Anemia  Provider:  Manpreet Bhutani MD  Procedure: Infusion  IV Type: Peripheral, IV Location: R Forearm  Feraheme (Ferumoxytol), Dose: 510 mg  Infusion Start Time: 1509  Infusion Stop Time: 1527  Post Infusion IV Care: Observation period completed and Peripheral IV Discontinued  Discharge: Condition: Stable, Destination: Home . AVS Provided  Performed by:  Binnie Kand, RN

## 2022-05-27 ENCOUNTER — Other Ambulatory Visit (HOSPITAL_COMMUNITY)
Admission: RE | Admit: 2022-05-27 | Discharge: 2022-05-27 | Disposition: A | Discharge status: Home / Self Care | Payer: Self-pay | Source: Ambulatory Visit | Attending: Nephrology | Admitting: Nephrology

## 2022-05-27 ENCOUNTER — Encounter (HOSPITAL_COMMUNITY)
Admission: RE | Admit: 2022-05-27 | Discharge: 2022-05-27 | Disposition: A | Discharge status: Home / Self Care | Payer: Self-pay | Source: Ambulatory Visit | Attending: Nephrology | Admitting: Nephrology

## 2022-05-27 VITALS — BP 147/84 | HR 69 | Temp 97.7°F | Resp 18

## 2022-05-27 DIAGNOSIS — N170 Acute kidney failure with tubular necrosis: Secondary | ICD-10-CM | POA: Insufficient documentation

## 2022-05-27 DIAGNOSIS — D649 Anemia, unspecified: Secondary | ICD-10-CM

## 2022-05-27 DIAGNOSIS — D509 Iron deficiency anemia, unspecified: Secondary | ICD-10-CM | POA: Insufficient documentation

## 2022-05-27 LAB — RENAL FUNCTION PANEL
Albumin: 3.7 g/dL (ref 3.5–5.0)
Anion gap: 7 (ref 5–15)
BUN: 30 mg/dL — ABNORMAL HIGH (ref 6–20)
CO2: 23 mmol/L (ref 22–32)
Calcium: 9 mg/dL (ref 8.9–10.3)
Chloride: 108 mmol/L (ref 98–111)
Creatinine, Ser: 0.97 mg/dL (ref 0.44–1.00)
GFR, Estimated: >60 mL/min (ref >60)
Glucose, Bld: 140 mg/dL — ABNORMAL HIGH (ref 70–99)
Phosphorus: 3.3 mg/dL (ref 2.5–4.6)
Potassium: 4.4 mmol/L (ref 3.5–5.1)
Sodium: 138 mmol/L (ref 135–145)

## 2022-05-27 MED ORDER — SODIUM CHLORIDE 0.9 % IV SOLN
510.0000 mg | Freq: Once | INTRAVENOUS | Status: AC
  Administered 2022-05-27: 510 mg via INTRAVENOUS
  Filled 2022-05-27: qty 510

## 2022-05-27 MED ORDER — ACETAMINOPHEN 325 MG PO TABS: 650.0000 mg | ORAL_TABLET | Freq: Once | ORAL | Status: DC

## 2022-05-27 MED ORDER — DIPHENHYDRAMINE HCL 25 MG PO CAPS: 25.0000 mg | ORAL_CAPSULE | Freq: Once | ORAL | Status: DC

## 2022-05-27 NOTE — Addendum Note (Signed)
Encounter addended by: Baxter Hire, RN on: 05/27/2022 3:09 PM  Actions taken: Therapy plan modified

## 2022-05-27 NOTE — Progress Notes (Signed)
Diagnosis: Iron Deficiency Anemia  Provider:  Manpreet Bhutani MD  Procedure: Infusion  IV Type: Peripheral, IV Location: L Forearm  Feraheme (Ferumoxytol), Dose: 510 mg  Infusion Start Time: 0909  Infusion Stop Time: 0925  Post Infusion IV Care: Patient declined observation and Peripheral IV Discontinued  Discharge: Condition: Good, Destination: Home . AVS Declined  Performed by:  Baxter Hire, RN

## 2022-05-29 MED FILL — Ferumoxytol Inj 510 MG/17ML (30 MG/ML) (Elemental Fe): INTRAVENOUS | Qty: 17 | Status: AC

## 2022-06-07 ENCOUNTER — Other Ambulatory Visit: Payer: Self-pay | Admitting: Physician Assistant

## 2022-06-07 MED ORDER — GABAPENTIN 100 MG PO CAPS: 100.0000 mg | ORAL_CAPSULE | Freq: Two times a day (BID) | ORAL | 0 refills | Status: DC | PRN

## 2022-06-23 ENCOUNTER — Ambulatory Visit: Payer: Self-pay | Admitting: Physician Assistant

## 2022-06-23 ENCOUNTER — Encounter: Payer: Self-pay | Admitting: Physician Assistant

## 2022-06-23 VITALS — BP 154/77 | HR 77 | Temp 97.8°F | Ht 67.0 in | Wt 184.0 lb

## 2022-06-23 DIAGNOSIS — I10 Essential (primary) hypertension: Secondary | ICD-10-CM

## 2022-06-23 DIAGNOSIS — M25512 Pain in left shoulder: Secondary | ICD-10-CM

## 2022-06-23 MED ORDER — LEVOTHYROXINE SODIUM 75 MCG PO TABS: ORAL_TABLET | ORAL | 1 refills | Status: DC

## 2022-06-23 MED ORDER — AMLODIPINE BESYLATE 10 MG PO TABS: 10.0000 mg | ORAL_TABLET | Freq: Every day | ORAL | 2 refills | Status: DC

## 2022-06-23 NOTE — Progress Notes (Signed)
BP (!) 154/77   Pulse 77   Temp 97.8 F (36.6 C)   Ht 5\' 7"  (1.702 m)   Wt 184 lb (83.5 kg)   LMP 04/26/2016   SpO2 98%   BMI 28.82 kg/m    Subjective:    Patient ID: Sandra Parsons, female    DOB: February 06, 1971, 52 y.o.   MRN: [id number]  HPI: Sandra Parsons is a 52 y.o. female presenting on 06/23/2022 for Follow-up and Shoulder Pain (L shoulder pain. Pt states she has had pain for the past 2 years which has only lasted 2-3 days. This time the pain has been daily for the past 3 weeks. Pt has been using pain patches which help, but they started leaving sores on her shoulder. )   HPI   Chief Complaint  Patient presents with   Follow-up   Shoulder Pain    L shoulder pain. Pt states she has had pain for the past 2 years which has only lasted 2-3 days. This time the pain has been daily for the past 3 weeks. Pt has been using pain patches which help, but they started leaving sores on her shoulder.       Pt with left shoulder pain x 2 years- intermittent.  Now it hurts all the time, waxes and wanes, for last 3 weeks.   No recent injuries.  She continues to work at [employer].     Relevant past medical, surgical, family and social history reviewed and updated as indicated. Interim medical history since our last visit reviewed. Allergies and medications reviewed and updated.   Current Outpatient Medications:    amLODipine (NORVASC) 5 MG tablet, Take 1 tablet (5 mg total) by mouth daily., Disp: 30 tablet, Rfl: 1   atorvastatin (LIPITOR) 20 MG tablet, TAKE 1 Tablet BY MOUTH ONCE EVERY DAY (Patient taking differently: Take 20 mg by mouth daily.), Disp: 90 tablet, Rfl: 1   gabapentin (NEURONTIN) 100 MG capsule, Take 1 capsule (100 mg total) by mouth 2 (two) times daily as needed., Disp: 60 capsule, Rfl: 0   hydrALAZINE (APRESOLINE) 25 MG tablet, Take 25 mg by mouth 2 (two) times daily., Disp: , Rfl:    insulin glargine (LANTUS SOLOSTAR) 100 UNIT/ML Solostar Pen, Inject 25 Units into the skin  daily., Disp: 15 mL, Rfl: 0   JANUMET 50-500 MG tablet, TAKE 1 Tablet  BY MOUTH TWICE DAILY WITH A MEAL, Disp: 180 tablet, Rfl: 1   levothyroxine (SYNTHROID) 75 MCG tablet, TAKE 1 Tablet BY MOUTH ONCE EVERY DAY, Disp: 30 tablet, Rfl: 1   dapagliflozin propanediol (FARXIGA) 5 MG TABS tablet, Take by mouth. (Patient not taking: Reported on 04/26/2022), Disp: , Rfl:    ferrous sulfate 325 (65 FE) MG tablet, Take 1 tablet (325 mg total) by mouth daily. (Patient not taking: Reported on 05/05/2022), Disp: 30 tablet, Rfl: 1   methocarbamol (ROBAXIN) 500 MG tablet, Take 1 tablet (500 mg total) by mouth 2 (two) times daily. (Patient not taking: Reported on 12/31/2021), Disp: 20 tablet, Rfl: 0   triamcinolone (KENALOG) 0.025 % ointment, Apply 1 application topically at bedtime as needed. (Patient not taking: Reported on 06/23/2022), Disp: 15 g, Rfl: 1    Review of Systems  Per HPI unless specifically indicated above     Objective:    BP (!) 154/77   Pulse 77   Temp 97.8 F (36.6 C)   Ht 5\' 7"  (1.702 m)   Wt 184 lb (83.5 kg)   LMP 04/26/2016  SpO2 98%   BMI 28.82 kg/m   Wt Readings from Last 3 Encounters:  06/23/22 184 lb (83.5 kg)  05/05/22 181 lb 8 oz (82.3 kg)  04/26/22 180 lb (81.6 kg)    Physical Exam Constitutional:      General: She is not in acute distress.    Appearance: She is not toxic-appearing.  HENT:     Head: Normocephalic and atraumatic.  Cardiovascular:     Rate and Rhythm: Normal rate and regular rhythm.  Pulmonary:     Effort: Pulmonary effort is normal. No respiratory distress.     Breath sounds: Normal breath sounds. No stridor. No wheezing, rhonchi or rales.  Musculoskeletal:     Right shoulder: Normal range of motion.     Left shoulder: Tenderness present. No swelling or deformity. Decreased range of motion. Normal pulse.     Comments: Left shoulder tenderness anteriorly  Neurological:     Mental Status: She is alert and oriented to person, place, and time.   Psychiatric:        Behavior: Behavior normal.            Assessment & Plan:     Encounter Diagnoses  Name Primary?   Essential hypertension Yes   Left shoulder pain, unspecified chronicity      -Increase amlodipine to 10mg .  Pt was given a BP machine so she can check it at home -Refer to orthopedics for evaluation of shoulder -she has [employer] assistance at 75% -pt to follow up 6 weeks.  She is to contact office sooner prn

## 2022-06-30 ENCOUNTER — Encounter: Payer: Self-pay | Admitting: Orthopedic Surgery

## 2022-06-30 ENCOUNTER — Other Ambulatory Visit (INDEPENDENT_AMBULATORY_CARE_PROVIDER_SITE_OTHER): Payer: Self-pay

## 2022-06-30 ENCOUNTER — Ambulatory Visit (INDEPENDENT_AMBULATORY_CARE_PROVIDER_SITE_OTHER): Payer: Self-pay | Admitting: Orthopedic Surgery

## 2022-06-30 VITALS — BP 150/84 | HR 88 | Ht 67.0 in | Wt 178.0 lb

## 2022-06-30 DIAGNOSIS — M25512 Pain in left shoulder: Secondary | ICD-10-CM

## 2022-06-30 NOTE — Patient Instructions (Addendum)

## 2022-07-02 NOTE — Progress Notes (Signed)
Orthopaedic Clinic Return  Assessment: [name] is a 52 y.o. female with the following: Left shoulder pain  Plan: [name] has had pain in her left shoulder for the past few weeks.  Atraumatic onset.  She has decent range of motion, with some obvious discomfort.  Unable to take NSAIDs at this time.  We discussed possibly proceeding with an injection, and she would like to proceed This was completed in clinic with no issues.  Procedure note injection Left shoulder    Verbal consent was obtained to inject the left shoulder, subacromial space Timeout was completed to confirm the site of injection.  The skin was prepped with alcohol and ethyl chloride was sprayed at the injection site.  A 21-gauge needle was used to inject 40 mg of Depo-Medrol and 1% lidocaine (3 cc) into the subacromial space of the left shoulder using a posterolateral approach.  There were no complications. A sterile bandage was applied.   Follow-up: Return if symptoms worsen or fail to improve.   Subjective:  Chief Complaint  Patient presents with   Shoulder Pain    L shoulder pain for a few weeks.   NDC: [phone]    History of Present Illness: Sandra Parsons is a 52 y.o. female who presents to clinic for evaluation of left shoulder pain.  She has had pain in the left shoulder for the past 3 weeks.  No specific injury.  She has difficulty with overhead motion.  Does not happen her before.  She cannot take NSAIDs.  No numbness or tingling.   Review of Systems: No fevers or chills No numbness or tingling No chest pain No shortness of breath No bowel or bladder dysfunction No GI distress No headaches  Objective: BP (!) 150/84   Pulse 88   Ht 5\' 7"  (1.702 m)   Wt 178 lb (80.7 kg)   LMP 04/26/2016   BMI 27.88 kg/m   Physical Exam:  Alert and oriented.  No acute distress.  Evaluation left shoulder demonstrates no swelling.  No bruising.  No deformity.  She has difficulty with overhead  motion.  She has pain with passive range of motion beyond 110 degrees.  External rotation is similar to the contralateral side.  Fingers warm and well-perfused.  Sensation is intact throughout the left hand.  IMAGING: I personally ordered and reviewed the following images:  Left shoulder x-rays were obtained in clinic today.  No acute injuries noted.  Glenohumeral joint is reduced.  No evidence of proximal humeral migration.  No calcium deposits.  No bony lesions.  Impression: Negative left shoulder x-ray  [name], MD 07/02/2022 8:34 AM

## 2022-07-05 ENCOUNTER — Other Ambulatory Visit: Payer: Self-pay | Admitting: Physician Assistant

## 2022-07-07 ENCOUNTER — Other Ambulatory Visit: Payer: Self-pay | Admitting: Physician Assistant

## 2022-07-07 DIAGNOSIS — E118 Type 2 diabetes mellitus with unspecified complications: Secondary | ICD-10-CM

## 2022-07-07 DIAGNOSIS — E039 Hypothyroidism, unspecified: Secondary | ICD-10-CM

## 2022-07-29 ENCOUNTER — Other Ambulatory Visit (HOSPITAL_COMMUNITY)
Admission: RE | Admit: 2022-07-29 | Discharge: 2022-07-29 | Disposition: A | Discharge status: Home / Self Care | Payer: Self-pay | Source: Ambulatory Visit | Attending: Physician Assistant | Admitting: Physician Assistant

## 2022-07-29 DIAGNOSIS — E118 Type 2 diabetes mellitus with unspecified complications: Secondary | ICD-10-CM | POA: Insufficient documentation

## 2022-07-29 DIAGNOSIS — E039 Hypothyroidism, unspecified: Secondary | ICD-10-CM | POA: Insufficient documentation

## 2022-07-29 LAB — HEMOGLOBIN A1C
Hgb A1c MFr Bld: 6.6 % — ABNORMAL HIGH (ref 4.8–5.6)
Mean Plasma Glucose: 142.72 mg/dL

## 2022-07-29 LAB — TSH: TSH: 4.443 u[IU]/mL (ref 0.350–4.500)

## 2022-08-04 ENCOUNTER — Encounter: Payer: Self-pay | Admitting: Physician Assistant

## 2022-08-04 ENCOUNTER — Ambulatory Visit: Payer: Self-pay | Admitting: Physician Assistant

## 2022-08-04 VITALS — BP 146/84 | HR 82 | Temp 98.0°F | Ht 67.0 in | Wt 184.5 lb

## 2022-08-04 DIAGNOSIS — I10 Essential (primary) hypertension: Secondary | ICD-10-CM

## 2022-08-04 DIAGNOSIS — D649 Anemia, unspecified: Secondary | ICD-10-CM

## 2022-08-04 DIAGNOSIS — R195 Other fecal abnormalities: Secondary | ICD-10-CM

## 2022-08-04 DIAGNOSIS — E118 Type 2 diabetes mellitus with unspecified complications: Secondary | ICD-10-CM

## 2022-08-04 DIAGNOSIS — N189 Chronic kidney disease, unspecified: Secondary | ICD-10-CM

## 2022-08-04 DIAGNOSIS — E039 Hypothyroidism, unspecified: Secondary | ICD-10-CM

## 2022-08-04 NOTE — Progress Notes (Signed)
BP (!) 146/84   Pulse 82   Temp 98 F (36.7 C)   Ht 5\' 7"  (1.702 m)   Wt 184 lb 8 oz (83.7 kg)   LMP 04/26/2016   SpO2 99%   BMI 28.90 kg/m    Subjective:    Patient ID: Sandra Parsons, female    DOB: [birth year], 52 y.o.   MRN: [id number]  HPI: Sandra Parsons is a 52 y.o. female presenting on 08/04/2022 for Diabetes, Hypothyroidism, and Hypertension   HPI  Chief Complaint  Patient presents with   Diabetes   Hypothyroidism   Hypertension    Pt says she is Upset today; she just found out her dog died(it was being kept by someone while she was at work).   Pt says Neprhologist no longer wants her to take the farxiga.  She says she has f/u to see nephrology 5/29-   She is going to see a lawyer about her dog issue.  She says her Bp was moslty  better until she got upset about her dog.  Pt saw GI March 2023 but has not gotten back in for evaluation by colonoscopy after + FIT test.  She says her [employer] financial assistance is active now until 10/11/22    Relevant past medical, surgical, family and social history reviewed and updated as indicated. Interim medical history since our last visit reviewed. Allergies and medications reviewed and updated.    Current Outpatient Medications:    amLODipine (NORVASC) 10 MG tablet, Take 1 tablet (10 mg total) by mouth daily., Disp: 30 tablet, Rfl: 2   atorvastatin (LIPITOR) 20 MG tablet, TAKE 1 Tablet BY MOUTH ONCE EVERY DAY (Patient taking differently: Take 20 mg by mouth daily.), Disp: 90 tablet, Rfl: 1   gabapentin (NEURONTIN) 100 MG capsule, TAKE 1 CAPSULE BY MOUTH TWICE DAILY AS NEEDED, Disp: 60 capsule, Rfl: 0   hydrALAZINE (APRESOLINE) 25 MG tablet, Take 25 mg by mouth 2 (two) times daily., Disp: , Rfl:    insulin glargine (LANTUS SOLOSTAR) 100 UNIT/ML Solostar Pen, Inject 25 Units into the skin daily., Disp: 15 mL, Rfl: 0   JANUMET 50-500 MG tablet, TAKE 1 Tablet  BY MOUTH TWICE DAILY WITH A MEAL, Disp: 180 tablet, Rfl: 1    levothyroxine (SYNTHROID) 75 MCG tablet, TAKE 1 Tablet BY MOUTH ONCE EVERY DAY, Disp: 30 tablet, Rfl: 1   triamcinolone (KENALOG) 0.025 % ointment, Apply 1 application topically at bedtime as needed., Disp: 15 g, Rfl: 1   methocarbamol (ROBAXIN) 500 MG tablet, Take 1 tablet (500 mg total) by mouth 2 (two) times daily. (Patient not taking: Reported on 12/31/2021), Disp: 20 tablet, Rfl: 0    Review of Systems  Per HPI unless specifically indicated above     Objective:    BP (!) 146/84   Pulse 82   Temp 98 F (36.7 C)   Ht 5\' 7"  (1.702 m)   Wt 184 lb 8 oz (83.7 kg)   LMP 04/26/2016   SpO2 99%   BMI 28.90 kg/m   Wt Readings from Last 3 Encounters:  08/04/22 184 lb 8 oz (83.7 kg)  06/30/22 178 lb (80.7 kg)  06/23/22 184 lb (83.5 kg)    Physical Exam Vitals reviewed.  Constitutional:      General: She is not in acute distress.    Appearance: She is well-developed. She is not ill-appearing.  HENT:     Head: Normocephalic and atraumatic.  Cardiovascular:     Rate and Rhythm: Normal  rate and regular rhythm.  Pulmonary:     Effort: Pulmonary effort is normal.     Breath sounds: Normal breath sounds.  Abdominal:     General: Bowel sounds are normal.     Palpations: Abdomen is soft. There is no mass.     Tenderness: There is no abdominal tenderness.  Musculoskeletal:     Cervical back: Neck supple.  Lymphadenopathy:     Cervical: No cervical adenopathy.  Skin:    General: Skin is warm and dry.  Neurological:     Mental Status: She is alert and oriented to person, place, and time.  Psychiatric:        Attention and Perception: Attention normal.        Mood and Affect: Affect is tearful.        Behavior: Behavior normal. Behavior is cooperative.     Results for orders placed or performed during the hospital encounter of 07/29/22  Hemoglobin A1c  Result Value Ref Range   Hgb A1c MFr Bld 6.6 (H) 4.8 - 5.6 %   Mean Plasma Glucose 142.72 mg/dL  TSH  Result Value Ref  Range   TSH 4.443 0.350 - 4.500 uIU/mL      Assessment & Plan:   Encounter Diagnoses  Name Primary?   Controlled diabetes mellitus type 2 with complications, unspecified whether long term insulin use (HCC) Yes   Hypothyroidism, unspecified type    Chronic kidney disease, unspecified CKD stage    Essential hypertension    Anemia, unspecified type    Positive FIT (fecal immunochemical test)      -reviewed labs with pt -She has f/u with nephrologist -nurse attempted to call GI but no one answered so she left VM -pt was referred for annual DM eye exam -pt Declined covid booster -discussed with pt that [facility] is available here if she would like to do that.  She says she will keep it in mind -pt to follow up 3 months.  She is to contact office sooner prn

## 2022-08-13 ENCOUNTER — Other Ambulatory Visit (HOSPITAL_COMMUNITY)
Admission: RE | Admit: 2022-08-13 | Discharge: 2022-08-13 | Disposition: A | Discharge status: Home / Self Care | Payer: Self-pay | Source: Ambulatory Visit | Attending: Nephrology | Admitting: Nephrology

## 2022-08-13 DIAGNOSIS — D631 Anemia in chronic kidney disease: Secondary | ICD-10-CM | POA: Insufficient documentation

## 2022-08-13 DIAGNOSIS — R809 Proteinuria, unspecified: Secondary | ICD-10-CM | POA: Insufficient documentation

## 2022-08-13 DIAGNOSIS — N170 Acute kidney failure with tubular necrosis: Secondary | ICD-10-CM | POA: Insufficient documentation

## 2022-08-13 DIAGNOSIS — N189 Chronic kidney disease, unspecified: Secondary | ICD-10-CM | POA: Insufficient documentation

## 2022-08-13 LAB — RENAL FUNCTION PANEL
Albumin: 3.7 g/dL (ref 3.5–5.0)
Anion gap: 10 (ref 5–15)
BUN: 38 mg/dL — ABNORMAL HIGH (ref 6–20)
CO2: 23 mmol/L (ref 22–32)
Calcium: 9.2 mg/dL (ref 8.9–10.3)
Chloride: 105 mmol/L (ref 98–111)
Creatinine, Ser: 1.66 mg/dL — ABNORMAL HIGH (ref 0.44–1.00)
GFR, Estimated: 37 mL/min — ABNORMAL LOW (ref >60)
Glucose, Bld: 117 mg/dL — ABNORMAL HIGH (ref 70–99)
Phosphorus: 3.9 mg/dL (ref 2.5–4.6)
Potassium: 3.8 mmol/L (ref 3.5–5.1)
Sodium: 138 mmol/L (ref 135–145)

## 2022-08-13 LAB — CBC
HCT: 30 % — ABNORMAL LOW (ref 36.0–46.0)
Hemoglobin: 10.3 g/dL — ABNORMAL LOW (ref 12.0–15.0)
MCH: 32.3 pg (ref 26.0–34.0)
MCHC: 34.3 g/dL (ref 30.0–36.0)
MCV: 94 fL (ref 80.0–100.0)
Platelets: 204 10*3/uL (ref 150–400)
RBC: 3.19 MIL/uL — ABNORMAL LOW (ref 3.87–5.11)
RDW: 12.1 % (ref 11.5–15.5)
WBC: 7.4 10*3/uL (ref 4.0–10.5)
nRBC: 0 % (ref 0.0–0.2)

## 2022-08-13 LAB — PROTEIN / CREATININE RATIO, URINE
Creatinine, Urine: 286 mg/dL
Protein Creatinine Ratio: 0.34 mg/mg{Cre} — ABNORMAL HIGH (ref 0.00–0.15)
Total Protein, Urine: 97 mg/dL

## 2022-08-26 ENCOUNTER — Encounter (HOSPITAL_COMMUNITY): Payer: Self-pay

## 2022-08-26 ENCOUNTER — Emergency Department (HOSPITAL_COMMUNITY)
Admission: EM | Admit: 2022-08-26 | Discharge: 2022-08-26 | Disposition: A | Discharge status: Home / Self Care | Payer: Self-pay | Attending: Emergency Medicine | Admitting: Emergency Medicine

## 2022-08-26 ENCOUNTER — Other Ambulatory Visit: Payer: Self-pay | Admitting: Physician Assistant

## 2022-08-26 ENCOUNTER — Other Ambulatory Visit: Payer: Self-pay

## 2022-08-26 DIAGNOSIS — E039 Hypothyroidism, unspecified: Secondary | ICD-10-CM | POA: Insufficient documentation

## 2022-08-26 DIAGNOSIS — Z7984 Long term (current) use of oral hypoglycemic drugs: Secondary | ICD-10-CM | POA: Insufficient documentation

## 2022-08-26 DIAGNOSIS — Z794 Long term (current) use of insulin: Secondary | ICD-10-CM | POA: Insufficient documentation

## 2022-08-26 DIAGNOSIS — N189 Chronic kidney disease, unspecified: Secondary | ICD-10-CM | POA: Insufficient documentation

## 2022-08-26 DIAGNOSIS — Z79899 Other long term (current) drug therapy: Secondary | ICD-10-CM | POA: Insufficient documentation

## 2022-08-26 DIAGNOSIS — E11319 Type 2 diabetes mellitus with unspecified diabetic retinopathy without macular edema: Secondary | ICD-10-CM | POA: Insufficient documentation

## 2022-08-26 DIAGNOSIS — I129 Hypertensive chronic kidney disease with stage 1 through stage 4 chronic kidney disease, or unspecified chronic kidney disease: Secondary | ICD-10-CM | POA: Insufficient documentation

## 2022-08-26 DIAGNOSIS — M5441 Lumbago with sciatica, right side: Secondary | ICD-10-CM | POA: Insufficient documentation

## 2022-08-26 MED ORDER — TRIAMCINOLONE ACETONIDE 40 MG/ML IJ SUSP
40.0000 mg | Freq: Once | INTRAMUSCULAR | Status: AC
  Administered 2022-08-26: 40 mg via INTRAMUSCULAR
  Filled 2022-08-26: qty 1

## 2022-08-26 MED ORDER — CYCLOBENZAPRINE HCL 10 MG PO TABS: 10.0000 mg | ORAL_TABLET | Freq: Two times a day (BID) | ORAL | 0 refills | Status: DC | PRN

## 2022-08-26 MED ORDER — HYDROCODONE-ACETAMINOPHEN 5-325 MG PO TABS
1.0000 | ORAL_TABLET | Freq: Once | ORAL | Status: AC
  Administered 2022-08-26: 1 via ORAL
  Filled 2022-08-26: qty 1

## 2022-08-26 NOTE — ED Provider Notes (Signed)
Sandra Parsons   CSN: [id number] Arrival date & time: 08/26/22  4098     History  Chief Complaint  Patient presents with   Back Pain    Sandra Parsons is a 52 y.o. female.   Back Pain   52 year old female presents emergency department with complaints of right low back pain.  Patient states that she was sitting in a very small chair yesterday at her nieces elementary graduation.  She states that she was in the chair for 2 to 3 hours very uncomfortable.  Patient with history of chronic back pain with 2 lumbar spine surgeries when she was in her 72s.  States she is on no chronic pain medication but deals daily with pain.  Reports spasming type feeling in her right lower back since getting out of the chair which persisted prompting visit to the emergency department today.  Reports history of baseline neuropathy in bilateral lower extremities extending up to about mid calf.  Denies any new/worsening sensory deficits, weakness in lower extremities, saddle anesthesia, bowel/bladder dysfunction, fever, history of intravenous drug use, prolonged corticosteroid use.  Denies any abdominal pain, nausea, vomiting, urinary symptoms, fever, change in bowel habits.  Past medical history significant for diabetes mellitus insulin-dependent, hypertension, hyperlipidemia, CKD, diabetic retinopathy, hepatitis B, hepatitis C, hypothyroidism  Home Medications Prior to Admission medications   Medication Sig Start Date End Date Taking? Authorizing Provider  cyclobenzaprine (FLEXERIL) 10 MG tablet Take 1 tablet (10 mg total) by mouth 2 (two) times daily as needed for muscle spasms. 08/26/22  Yes Sherian Maroon A, PA  amLODipine (NORVASC) 10 MG tablet Take 1 tablet (10 mg total) by mouth daily. 06/23/22   Jacquelin Hawking, PA-C  atorvastatin (LIPITOR) 20 MG tablet TAKE 1 Tablet BY MOUTH ONCE EVERY DAY Patient taking differently: Take 20 mg by mouth daily.  03/02/22   Jacquelin Hawking, PA-C  gabapentin (NEURONTIN) 100 MG capsule TAKE 1 CAPSULE BY MOUTH TWICE DAILY AS NEEDED 07/05/22   Jacquelin Hawking, PA-C  hydrALAZINE (APRESOLINE) 25 MG tablet Take 25 mg by mouth 2 (two) times daily. 04/14/22 04/14/23  [provider]  insulin glargine (LANTUS SOLOSTAR) 100 UNIT/ML Solostar Pen Inject 25 Units into the skin daily. 07/23/20   Jacquelin Hawking, PA-C  JANUMET 50-500 MG tablet TAKE 1 Tablet  BY MOUTH TWICE DAILY WITH A MEAL 03/02/22   Jacquelin Hawking, PA-C  levothyroxine (SYNTHROID) 75 MCG tablet TAKE 1 Tablet BY MOUTH ONCE EVERY DAY 06/23/22   Jacquelin Hawking, PA-C  methocarbamol (ROBAXIN) 500 MG tablet Take 1 tablet (500 mg total) by mouth 2 (two) times daily. Patient not taking: Reported on 12/31/2021 11/27/21   Prosperi, Christian H, PA-C  triamcinolone (KENALOG) 0.025 % ointment Apply 1 application topically at bedtime as needed. 06/14/19   Jacquelin Hawking, PA-C      Allergies    Prednisone    Review of Systems   Review of Systems  Musculoskeletal:  Positive for back pain.  All other systems reviewed and are negative.   Physical Exam Updated Vital Signs BP (!) 156/81   Pulse 72   Temp 98.2 F (36.8 C) (Oral)   Resp 16   Ht 5\' 7"  (1.702 m)   Wt 86.2 kg   LMP 04/26/2016   SpO2 100%   BMI 29.76 kg/m  Physical Exam Vitals and nursing Parsons reviewed.  Constitutional:      General: She is not in acute distress.    Appearance: She is  well-developed.  HENT:     Head: Normocephalic and atraumatic.  Eyes:     Conjunctiva/sclera: Conjunctivae normal.  Cardiovascular:     Rate and Rhythm: Normal rate and regular rhythm.     Heart sounds: No murmur heard. Pulmonary:     Effort: Pulmonary effort is normal. No respiratory distress.     Breath sounds: Normal breath sounds. No wheezing, rhonchi or rales.  Abdominal:     Palpations: Abdomen is soft.     Tenderness: There is no abdominal tenderness. There is no guarding.   Musculoskeletal:        General: No swelling.     Cervical back: Neck supple.     Comments: No midline tenderness of cervical, thoracic, lumbar spine with no step-off or deformity noted.  Paraspinal tenderness in the right lumbar region.  Muscular strength symmetric bilateral lower extremities knee flexion, extension, ankle dorsi/plantarflexion, hip flexion/extension.  Pedal pulses 2+ bilaterally.  Patient with sensory deficits approximately mid tibia distally but otherwise, without sensory deficits along major nerve distributions of lower extremities.  DTRs symmetric at patella.  No overlying skin abnormalities.  Straight leg raise positive on right.  Skin:    General: Skin is warm and dry.     Capillary Refill: Capillary refill takes less than 2 seconds.  Neurological:     Mental Status: She is alert.  Psychiatric:        Mood and Affect: Mood normal.     ED Results / Procedures / Treatments   Labs (all labs ordered are listed, but only abnormal results are displayed) Labs Reviewed - No data to display  EKG None  Radiology No results found.  Procedures Procedures    Medications Ordered in ED Medications  HYDROcodone-acetaminophen (NORCO/VICODIN) 5-325 MG per tablet 1 tablet (1 tablet Oral Given 08/26/22 1001)  triamcinolone acetonide (KENALOG-40) injection 40 mg (40 mg Intramuscular Given 08/26/22 1033)    ED Course/ Medical Decision Making/ A&P                             Medical Decision Making Risk Prescription drug management.   This patient presents to the ED for concern of back pain, this involves an extensive number of treatment options, and is a complaint that carries with it a high risk of complications and morbidity.  The differential diagnosis includes fracture, strain/sprain, dislocation, nephrolithiasis, pyelonephritis, spinal cord compression, cauda equina, spinal epidural abscess, spinal stenosis, AAA, Aortic dissection   Co morbidities that complicate  the patient evaluation  See HPI   Additional history obtained:  Additional history obtained from EMR External records from outside source obtained and reviewed including hospital records   Lab Tests:  N/a   Imaging Studies ordered:  N/a   Cardiac Monitoring: / EKG:  The patient was maintained on a cardiac monitor.  I personally viewed and interpreted the cardiac monitored which showed an underlying rhythm of: sinus rhythm   Consultations Obtained:  N/a   Problem List / ED Course / Critical interventions / Medication management  Back pain I ordered medication including Kenalog, norco   Reevaluation of the patient after these medicines showed that the patient improved I have reviewed the patients home medicines and have made adjustments as needed   Social Determinants of Health:  Former cigarette use.  Denies illicit drug use.   Test / Admission - Considered:  Back pain Vitals signs significant for mild hypertension with blood pressure 156/81. Otherwise  within normal range and stable throughout visit. 52 year old female presents emergency department with complaints of right low back pain after sitting in small chair for 2 to 3 hours yesterday.  Patient without any concerning trauma so low suspicion for acute fracture/dislocation.  Patient without evidence of red flag signs of back pain on HPI or PE so low suspicion for acute spinal cord compression.  Patient without intra-abdominal symptoms or urinary symptoms so low suspicion for nephrolithiasis/pyelonephritis.  Patient symptoms most likely secondary from muscular etiology.  Given patient's history of diabetes insulin-dependent, shared decision made conversation was had regarding corticosteroid use empirically given presence of sciatica with patient's history of CKD with pain refractory to over-the-counter Tylenol.  Patient elected for IM injection as she has had more success managing at home blood glucose with IM  injection over oral more prolonged course of corticosteroid.  Will also send patient home with muscle laxer to use as needed.  Patient recommended follow-up with spinal specialist/primary care provider for reevaluation of symptoms.  Patient overall well-appearing, afebrile in no acute distress.  Further workup deemed unnecessary at this time while emergency department.  Treatment plan discussed at length with patient and she acknowledged understanding was agreeable to said plan. Worrisome signs and symptoms were discussed with the patient, and the patient acknowledged understanding to return to the ED if noticed. Patient was stable upon discharge.   Wanted IM steroid        Final Clinical Impression(s) / ED Diagnoses Final diagnoses:  Acute right-sided low back pain with right-sided sciatica    Rx / DC Orders ED Discharge Orders          Ordered    cyclobenzaprine (FLEXERIL) 10 MG tablet  2 times daily PRN        08/26/22 0956              Peter Garter, PA 08/26/22 1048    Terrilee Files, MD 08/26/22 1739

## 2022-08-26 NOTE — ED Triage Notes (Signed)
Patient sat in a low chair during a graduation ceremony yesterday and her back began to feel uncomfortable. She says the pain is increasing

## 2022-08-26 NOTE — Discharge Instructions (Signed)
Note the workup today was overall consistent with most likely muscular injury of your lower back.  Will treat this with steroid injection while in the emergency department and will send you home with muscle laxer use as needed.  Note muscle laxer can cause drowsiness so please do not drive while taking said medication.  Continue take Tylenol at home for pain.  Recommend follow-up with spinal specialist for reevaluation if symptoms continue.  Please do not hesitate to return to emergency department for worrisome signs and symptoms we discussed become apparent.

## 2022-08-30 ENCOUNTER — Other Ambulatory Visit: Payer: Self-pay | Admitting: Physician Assistant

## 2022-08-30 MED ORDER — SITAGLIPTIN PHOSPHATE 50 MG PO TABS: 50.0000 mg | ORAL_TABLET | Freq: Every day | ORAL | 1 refills | Status: DC

## 2022-08-30 MED ORDER — METFORMIN HCL 500 MG PO TABS
500.0000 mg | ORAL_TABLET | Freq: Two times a day (BID) | ORAL | 1 refills | Status: DC
Start: 2022-08-30 — End: 2023-04-27

## 2022-08-30 MED ORDER — LEVOTHYROXINE SODIUM 75 MCG PO TABS: ORAL_TABLET | ORAL | 1 refills | Status: DC

## 2022-08-31 ENCOUNTER — Telehealth: Payer: Self-pay

## 2022-08-31 NOTE — Telephone Encounter (Signed)
Attempted first call for hospital follow up of [facility] client. No answer, left message requesting return call. Visit to [facility] ER was 08/26/22 for lower back pain.    Francee Nodal RN [facility]

## 2022-09-03 ENCOUNTER — Telehealth: Payer: Self-pay

## 2022-09-03 NOTE — Telephone Encounter (Signed)
2nd Attempt hospital follow up today. No answer, left message requesting return call at her earliest convenience.   Francee Nodal RN [facility] [employer]

## 2022-09-17 ENCOUNTER — Other Ambulatory Visit: Payer: Self-pay | Admitting: Physician Assistant

## 2022-09-19 ENCOUNTER — Other Ambulatory Visit: Payer: Self-pay | Admitting: Physician Assistant

## 2022-09-21 ENCOUNTER — Other Ambulatory Visit: Payer: Self-pay | Admitting: Physician Assistant

## 2022-09-21 ENCOUNTER — Encounter: Payer: Self-pay | Admitting: Physician Assistant

## 2022-09-21 MED ORDER — AMLODIPINE BESYLATE 10 MG PO TABS: 10.0000 mg | ORAL_TABLET | Freq: Every day | ORAL | 0 refills | Status: DC

## 2022-10-06 ENCOUNTER — Other Ambulatory Visit: Payer: Self-pay

## 2022-10-11 ENCOUNTER — Other Ambulatory Visit: Payer: Self-pay | Admitting: Physician Assistant

## 2022-10-11 DIAGNOSIS — E785 Hyperlipidemia, unspecified: Secondary | ICD-10-CM

## 2022-10-11 DIAGNOSIS — E039 Hypothyroidism, unspecified: Secondary | ICD-10-CM

## 2022-10-11 DIAGNOSIS — E118 Type 2 diabetes mellitus with unspecified complications: Secondary | ICD-10-CM

## 2022-11-03 ENCOUNTER — Ambulatory Visit: Payer: Self-pay | Admitting: Physician Assistant

## 2022-11-08 ENCOUNTER — Other Ambulatory Visit: Payer: Self-pay | Admitting: Physician Assistant

## 2022-11-23 ENCOUNTER — Other Ambulatory Visit: Payer: Self-pay | Admitting: Physician Assistant

## 2022-11-23 MED ORDER — LEVOTHYROXINE SODIUM 75 MCG PO TABS: ORAL_TABLET | ORAL | 0 refills | Status: DC

## 2022-12-06 ENCOUNTER — Other Ambulatory Visit (HOSPITAL_COMMUNITY)
Admission: RE | Admit: 2022-12-06 | Discharge: 2022-12-06 | Disposition: A | Discharge status: Home / Self Care | Payer: Self-pay | Source: Ambulatory Visit | Attending: Nephrology | Admitting: Nephrology

## 2022-12-06 DIAGNOSIS — N2581 Secondary hyperparathyroidism of renal origin: Secondary | ICD-10-CM | POA: Insufficient documentation

## 2022-12-06 DIAGNOSIS — D638 Anemia in other chronic diseases classified elsewhere: Secondary | ICD-10-CM | POA: Insufficient documentation

## 2022-12-06 DIAGNOSIS — R809 Proteinuria, unspecified: Secondary | ICD-10-CM | POA: Insufficient documentation

## 2022-12-06 DIAGNOSIS — Z79899 Other long term (current) drug therapy: Secondary | ICD-10-CM | POA: Insufficient documentation

## 2022-12-06 DIAGNOSIS — E1122 Type 2 diabetes mellitus with diabetic chronic kidney disease: Secondary | ICD-10-CM | POA: Insufficient documentation

## 2022-12-06 LAB — RENAL FUNCTION PANEL
Albumin: 3.8 g/dL (ref 3.5–5.0)
Anion gap: 9 (ref 5–15)
BUN: 27 mg/dL — ABNORMAL HIGH (ref 6–20)
CO2: 25 mmol/L (ref 22–32)
Calcium: 8.9 mg/dL (ref 8.9–10.3)
Chloride: 105 mmol/L (ref 98–111)
Creatinine, Ser: 1.31 mg/dL — ABNORMAL HIGH (ref 0.44–1.00)
GFR, Estimated: 50 mL/min — ABNORMAL LOW (ref >60)
Glucose, Bld: 78 mg/dL (ref 70–99)
Phosphorus: 3.1 mg/dL (ref 2.5–4.6)
Potassium: 4 mmol/L (ref 3.5–5.1)
Sodium: 139 mmol/L (ref 135–145)

## 2022-12-06 LAB — PROTEIN / CREATININE RATIO, URINE
Creatinine, Urine: 296 mg/dL
Protein Creatinine Ratio: 0.74 mg/mg{creat} — ABNORMAL HIGH (ref 0.00–0.15)
Total Protein, Urine: 220 mg/dL

## 2022-12-06 LAB — CBC WITH DIFFERENTIAL/PLATELET
Abs Immature Granulocytes: 0.03 10*3/uL (ref 0.00–0.07)
Basophils Absolute: 0.1 10*3/uL (ref 0.0–0.1)
Basophils Relative: 1 %
Eosinophils Absolute: 0.6 10*3/uL — ABNORMAL HIGH (ref 0.0–0.5)
Eosinophils Relative: 7 %
HCT: 32.3 % — ABNORMAL LOW (ref 36.0–46.0)
Hemoglobin: 11 g/dL — ABNORMAL LOW (ref 12.0–15.0)
Immature Granulocytes: 0 %
Lymphocytes Relative: 27 %
Lymphs Abs: 2.2 10*3/uL (ref 0.7–4.0)
MCH: 31.8 pg (ref 26.0–34.0)
MCHC: 34.1 g/dL (ref 30.0–36.0)
MCV: 93.4 fL (ref 80.0–100.0)
Monocytes Absolute: 0.9 10*3/uL (ref 0.1–1.0)
Monocytes Relative: 10 %
Neutro Abs: 4.6 10*3/uL (ref 1.7–7.7)
Neutrophils Relative %: 55 %
Platelets: 228 10*3/uL (ref 150–400)
RBC: 3.46 MIL/uL — ABNORMAL LOW (ref 3.87–5.11)
RDW: 12.7 % (ref 11.5–15.5)
WBC: 8.4 10*3/uL (ref 4.0–10.5)
nRBC: 0 % (ref 0.0–0.2)

## 2022-12-07 LAB — PARATHYROID HORMONE, INTACT (NO CA): PTH: 30 pg/mL (ref 15–65)

## 2022-12-15 ENCOUNTER — Ambulatory Visit: Payer: Self-pay | Admitting: Physician Assistant

## 2022-12-15 ENCOUNTER — Encounter: Payer: Self-pay | Admitting: Physician Assistant

## 2022-12-15 VITALS — BP 120/72 | HR 78 | Temp 97.6°F | Ht 67.0 in | Wt 186.2 lb

## 2022-12-15 DIAGNOSIS — E118 Type 2 diabetes mellitus with unspecified complications: Secondary | ICD-10-CM

## 2022-12-15 DIAGNOSIS — D649 Anemia, unspecified: Secondary | ICD-10-CM

## 2022-12-15 DIAGNOSIS — E039 Hypothyroidism, unspecified: Secondary | ICD-10-CM

## 2022-12-15 DIAGNOSIS — N189 Chronic kidney disease, unspecified: Secondary | ICD-10-CM

## 2022-12-15 DIAGNOSIS — Z1231 Encounter for screening mammogram for malignant neoplasm of breast: Secondary | ICD-10-CM

## 2022-12-15 DIAGNOSIS — I10 Essential (primary) hypertension: Secondary | ICD-10-CM

## 2022-12-15 DIAGNOSIS — E785 Hyperlipidemia, unspecified: Secondary | ICD-10-CM

## 2022-12-15 NOTE — Progress Notes (Signed)
BP 120/72   Pulse 78   Temp 97.6 F (36.4 C)   Ht 5\' 7"  (1.702 m)   Wt 186 lb 4 oz (84.5 kg)   LMP 04/26/2016   SpO2 98%   BMI 29.17 kg/m    Subjective:    Patient ID: Sandra Parsons, female    DOB: Apr 30, 1970, 52 y.o.   MRN: [id number]  HPI: Sandra Parsons is a 52 y.o. female presenting on 12/15/2022 for Diabetes, Hypothyroidism, and Hyperlipidemia   HPI   Chief Complaint  Patient presents with   Diabetes   Hypothyroidism   Hyperlipidemia    Pt says she is Doing okay.  She has no complaints.   Pt got labs drawn but they did nephrology labs not ones ordered for this office.    Relevant past medical, surgical, family and social history reviewed and updated as indicated. Interim medical history since our last visit reviewed. Allergies and medications reviewed and updated.   Current Outpatient Medications:    amLODipine (NORVASC) 10 MG tablet, Take 1 tablet (10 mg total) by mouth daily., Disp: 90 tablet, Rfl: 0   atorvastatin (LIPITOR) 20 MG tablet, TAKE 1 Tablet BY MOUTH ONCE EVERY DAY, Disp: 90 tablet, Rfl: 1   gabapentin (NEURONTIN) 100 MG capsule, TAKE 1 CAPSULE BY MOUTH TWICE DAILY AS NEEDED, Disp: 60 capsule, Rfl: 0   hydrALAZINE (APRESOLINE) 25 MG tablet, Take 25 mg by mouth 2 (two) times daily., Disp: , Rfl:    insulin glargine (LANTUS SOLOSTAR) 100 UNIT/ML Solostar Pen, Inject 25 Units into the skin daily., Disp: 15 mL, Rfl: 0   levothyroxine (SYNTHROID) 75 MCG tablet, TAKE 1 Tablet BY MOUTH ONCE EVERY DAY, Disp: 30 tablet, Rfl: 0   metFORMIN (GLUCOPHAGE) 500 MG tablet, Take 1 tablet (500 mg total) by mouth 2 (two) times daily with a meal., Disp: 180 tablet, Rfl: 1   sitaGLIPtin (JANUVIA) 50 MG tablet, Take 1 tablet (50 mg total) by mouth daily., Disp: 90 tablet, Rfl: 1   triamcinolone (KENALOG) 0.025 % ointment, Apply 1 application topically at bedtime as needed., Disp: 15 g, Rfl: 1   cyclobenzaprine (FLEXERIL) 10 MG tablet, Take 1 tablet (10 mg total) by  mouth 2 (two) times daily as needed for muscle spasms. (Patient not taking: Reported on 12/15/2022), Disp: 20 tablet, Rfl: 0   methocarbamol (ROBAXIN) 500 MG tablet, Take 1 tablet (500 mg total) by mouth 2 (two) times daily. (Patient not taking: Reported on 12/31/2021), Disp: 20 tablet, Rfl: 0    Review of Systems  Per HPI unless specifically indicated above     Objective:    BP 120/72   Pulse 78   Temp 97.6 F (36.4 C)   Ht 5\' 7"  (1.702 m)   Wt 186 lb 4 oz (84.5 kg)   LMP 04/26/2016   SpO2 98%   BMI 29.17 kg/m   Wt Readings from Last 3 Encounters:  12/15/22 186 lb 4 oz (84.5 kg)  08/26/22 190 lb (86.2 kg)  08/04/22 184 lb 8 oz (83.7 kg)    Physical Exam Vitals reviewed.  Constitutional:      General: She is not in acute distress.    Appearance: She is well-developed. She is not toxic-appearing.  HENT:     Head: Normocephalic and atraumatic.  Eyes:     Extraocular Movements: Extraocular movements intact.     Conjunctiva/sclera: Conjunctivae normal.     Pupils: Pupils are equal, round, and reactive to light.  Cardiovascular:  Rate and Rhythm: Normal rate and regular rhythm.  Pulmonary:     Effort: Pulmonary effort is normal.     Breath sounds: Normal breath sounds.  Abdominal:     General: Bowel sounds are normal.     Palpations: Abdomen is soft. There is no hepatomegaly, splenomegaly, mass or pulsatile mass.     Tenderness: There is no abdominal tenderness. There is no guarding or rebound.  Musculoskeletal:     Cervical back: Neck supple.     Right lower leg: No edema.     Left lower leg: No edema.  Lymphadenopathy:     Cervical: No cervical adenopathy.  Skin:    General: Skin is warm and dry.  Neurological:     Mental Status: She is alert and oriented to person, place, and time.  Psychiatric:        Behavior: Behavior normal.           Assessment & Plan:   Encounter Diagnoses  Name Primary?   Controlled diabetes mellitus type 2 with  complications, unspecified whether long term insulin use (HCC) Yes   Hypothyroidism, unspecified type    Hyperlipidemia, unspecified hyperlipidemia type    Chronic kidney disease, unspecified CKD stage    Essential hypertension    Anemia, unspecified type    Encounter for screening mammogram for malignant neoplasm of breast       -refer for screening Mammogram which is due november   -Flu shot - done earlier this month -DM foot exam updated today. -pt will get her fasting labs drawn and he will be called with results -no changes to medications today -pt to follow up in 3 months.  She is to contact office sooner prn

## 2022-12-24 ENCOUNTER — Telehealth: Payer: Self-pay

## 2022-12-24 NOTE — Telephone Encounter (Signed)
Telephoned patient at mobile number. Left a voice message with [facility] ([facility]olars[facility]) contact information. 

## 2023-01-24 ENCOUNTER — Other Ambulatory Visit: Payer: Self-pay | Admitting: Physician Assistant

## 2023-02-07 ENCOUNTER — Other Ambulatory Visit: Payer: Self-pay | Admitting: Physician Assistant

## 2023-02-07 MED ORDER — LEVOTHYROXINE SODIUM 75 MCG PO TABS: ORAL_TABLET | ORAL | 1 refills | Status: DC

## 2023-02-09 ENCOUNTER — Other Ambulatory Visit: Payer: Self-pay | Admitting: Physician Assistant

## 2023-02-09 DIAGNOSIS — Z1231 Encounter for screening mammogram for malignant neoplasm of breast: Secondary | ICD-10-CM

## 2023-02-23 ENCOUNTER — Encounter (HOSPITAL_COMMUNITY): Payer: Self-pay

## 2023-02-23 ENCOUNTER — Ambulatory Visit (HOSPITAL_COMMUNITY)
Admission: RE | Admit: 2023-02-23 | Discharge: 2023-02-23 | Disposition: A | Discharge status: Home / Self Care | Payer: Self-pay | Source: Ambulatory Visit | Attending: Physician Assistant | Admitting: Physician Assistant

## 2023-02-23 DIAGNOSIS — Z1231 Encounter for screening mammogram for malignant neoplasm of breast: Secondary | ICD-10-CM | POA: Insufficient documentation

## 2023-03-21 ENCOUNTER — Other Ambulatory Visit: Payer: Self-pay | Admitting: Physician Assistant

## 2023-03-21 DIAGNOSIS — E039 Hypothyroidism, unspecified: Secondary | ICD-10-CM

## 2023-03-21 DIAGNOSIS — I10 Essential (primary) hypertension: Secondary | ICD-10-CM

## 2023-03-21 DIAGNOSIS — E785 Hyperlipidemia, unspecified: Secondary | ICD-10-CM

## 2023-03-21 DIAGNOSIS — E118 Type 2 diabetes mellitus with unspecified complications: Secondary | ICD-10-CM

## 2023-03-21 DIAGNOSIS — N189 Chronic kidney disease, unspecified: Secondary | ICD-10-CM

## 2023-04-06 ENCOUNTER — Ambulatory Visit: Payer: Self-pay | Admitting: Physician Assistant

## 2023-04-08 ENCOUNTER — Other Ambulatory Visit (HOSPITAL_COMMUNITY)
Admission: RE | Admit: 2023-04-08 | Discharge: 2023-04-08 | Disposition: A | Discharge status: Home / Self Care | Payer: Self-pay | Source: Ambulatory Visit | Attending: Nephrology | Admitting: Nephrology

## 2023-04-08 ENCOUNTER — Other Ambulatory Visit (HOSPITAL_COMMUNITY)
Admission: RE | Admit: 2023-04-08 | Discharge: 2023-04-08 | Disposition: A | Discharge status: Home / Self Care | Payer: Self-pay | Source: Ambulatory Visit | Attending: Physician Assistant | Admitting: Physician Assistant

## 2023-04-08 DIAGNOSIS — E785 Hyperlipidemia, unspecified: Secondary | ICD-10-CM | POA: Insufficient documentation

## 2023-04-08 DIAGNOSIS — N170 Acute kidney failure with tubular necrosis: Secondary | ICD-10-CM | POA: Insufficient documentation

## 2023-04-08 DIAGNOSIS — I10 Essential (primary) hypertension: Secondary | ICD-10-CM | POA: Insufficient documentation

## 2023-04-08 DIAGNOSIS — R809 Proteinuria, unspecified: Secondary | ICD-10-CM | POA: Insufficient documentation

## 2023-04-08 DIAGNOSIS — N189 Chronic kidney disease, unspecified: Secondary | ICD-10-CM | POA: Insufficient documentation

## 2023-04-08 DIAGNOSIS — E039 Hypothyroidism, unspecified: Secondary | ICD-10-CM | POA: Insufficient documentation

## 2023-04-08 DIAGNOSIS — E1122 Type 2 diabetes mellitus with diabetic chronic kidney disease: Secondary | ICD-10-CM | POA: Insufficient documentation

## 2023-04-08 DIAGNOSIS — E118 Type 2 diabetes mellitus with unspecified complications: Secondary | ICD-10-CM | POA: Insufficient documentation

## 2023-04-08 DIAGNOSIS — D631 Anemia in chronic kidney disease: Secondary | ICD-10-CM | POA: Insufficient documentation

## 2023-04-08 LAB — COMPREHENSIVE METABOLIC PANEL
ALT: 23 U/L (ref 0–44)
AST: 27 U/L (ref 15–41)
Albumin: 4.2 g/dL (ref 3.5–5.0)
Alkaline Phosphatase: 74 U/L (ref 38–126)
Anion gap: 8 (ref 5–15)
BUN: 35 mg/dL — ABNORMAL HIGH (ref 6–20)
CO2: 22 mmol/L (ref 22–32)
Calcium: 9.1 mg/dL (ref 8.9–10.3)
Chloride: 105 mmol/L (ref 98–111)
Creatinine, Ser: 1.62 mg/dL — ABNORMAL HIGH (ref 0.44–1.00)
GFR, Estimated: 38 mL/min — ABNORMAL LOW (ref >60)
Glucose, Bld: 108 mg/dL — ABNORMAL HIGH (ref 70–99)
Potassium: 3.7 mmol/L (ref 3.5–5.1)
Sodium: 135 mmol/L (ref 135–145)
Total Bilirubin: 0.7 mg/dL (ref 0.0–1.2)
Total Protein: 8 g/dL (ref 6.5–8.1)

## 2023-04-08 LAB — RENAL FUNCTION PANEL
Albumin: 4 g/dL (ref 3.5–5.0)
Anion gap: 9 (ref 5–15)
BUN: 35 mg/dL — ABNORMAL HIGH (ref 6–20)
CO2: 22 mmol/L (ref 22–32)
Calcium: 9.1 mg/dL (ref 8.9–10.3)
Chloride: 105 mmol/L (ref 98–111)
Creatinine, Ser: 1.61 mg/dL — ABNORMAL HIGH (ref 0.44–1.00)
GFR, Estimated: 39 mL/min — ABNORMAL LOW (ref >60)
Glucose, Bld: 110 mg/dL — ABNORMAL HIGH (ref 70–99)
Phosphorus: 3.7 mg/dL (ref 2.5–4.6)
Potassium: 3.7 mmol/L (ref 3.5–5.1)
Sodium: 136 mmol/L (ref 135–145)

## 2023-04-08 LAB — PROTEIN / CREATININE RATIO, URINE
Creatinine, Urine: 257 mg/dL
Protein Creatinine Ratio: 0.56 mg/mg{creat} — ABNORMAL HIGH (ref 0.00–0.15)
Total Protein, Urine: 144 mg/dL

## 2023-04-08 LAB — CBC
HCT: 33.8 % — ABNORMAL LOW (ref 36.0–46.0)
Hemoglobin: 11.4 g/dL — ABNORMAL LOW (ref 12.0–15.0)
MCH: 31.4 pg (ref 26.0–34.0)
MCHC: 33.7 g/dL (ref 30.0–36.0)
MCV: 93.1 fL (ref 80.0–100.0)
Platelets: 255 10*3/uL (ref 150–400)
RBC: 3.63 MIL/uL — ABNORMAL LOW (ref 3.87–5.11)
RDW: 13.3 % (ref 11.5–15.5)
WBC: 8.6 10*3/uL (ref 4.0–10.5)
nRBC: 0 % (ref 0.0–0.2)

## 2023-04-08 LAB — LIPID PANEL
Cholesterol: 181 mg/dL (ref 0–200)
HDL: 48 mg/dL (ref >40)
LDL Cholesterol: 104 mg/dL — ABNORMAL HIGH (ref 0–99)
Total CHOL/HDL Ratio: 3.8 {ratio}
Triglycerides: 145 mg/dL (ref <150)
VLDL: 29 mg/dL (ref 0–40)

## 2023-04-08 LAB — HEMOGLOBIN A1C
Hgb A1c MFr Bld: 7.9 % — ABNORMAL HIGH (ref 4.8–5.6)
Mean Plasma Glucose: 180.03 mg/dL

## 2023-04-08 LAB — TSH: TSH: 38.902 u[IU]/mL — ABNORMAL HIGH (ref 0.350–4.500)

## 2023-04-09 ENCOUNTER — Other Ambulatory Visit: Payer: Self-pay

## 2023-04-09 ENCOUNTER — Emergency Department (HOSPITAL_COMMUNITY): Payer: Self-pay

## 2023-04-09 ENCOUNTER — Encounter (HOSPITAL_COMMUNITY): Payer: Self-pay | Admitting: Emergency Medicine

## 2023-04-09 ENCOUNTER — Emergency Department (HOSPITAL_COMMUNITY)
Admission: EM | Admit: 2023-04-09 | Discharge: 2023-04-10 | Disposition: A | Discharge status: Home / Self Care | Payer: Self-pay | Attending: Emergency Medicine | Admitting: Emergency Medicine

## 2023-04-09 DIAGNOSIS — T148XXA Other injury of unspecified body region, initial encounter: Secondary | ICD-10-CM

## 2023-04-09 DIAGNOSIS — Z7984 Long term (current) use of oral hypoglycemic drugs: Secondary | ICD-10-CM | POA: Insufficient documentation

## 2023-04-09 DIAGNOSIS — S62337A Displaced fracture of neck of fifth metacarpal bone, left hand, initial encounter for closed fracture: Secondary | ICD-10-CM | POA: Insufficient documentation

## 2023-04-09 DIAGNOSIS — S61412A Laceration without foreign body of left hand, initial encounter: Secondary | ICD-10-CM | POA: Insufficient documentation

## 2023-04-09 DIAGNOSIS — E119 Type 2 diabetes mellitus without complications: Secondary | ICD-10-CM | POA: Insufficient documentation

## 2023-04-09 DIAGNOSIS — W540XXA Bitten by dog, initial encounter: Secondary | ICD-10-CM | POA: Insufficient documentation

## 2023-04-09 DIAGNOSIS — Z23 Encounter for immunization: Secondary | ICD-10-CM | POA: Insufficient documentation

## 2023-04-09 LAB — CBC WITH DIFFERENTIAL/PLATELET
Abs Immature Granulocytes: 0.01 10*3/uL (ref 0.00–0.07)
Basophils Absolute: 0.1 10*3/uL (ref 0.0–0.1)
Basophils Relative: 1 %
Eosinophils Absolute: 0.8 10*3/uL — ABNORMAL HIGH (ref 0.0–0.5)
Eosinophils Relative: 8 %
HCT: 33.2 % — ABNORMAL LOW (ref 36.0–46.0)
Hemoglobin: 11 g/dL — ABNORMAL LOW (ref 12.0–15.0)
Immature Granulocytes: 0 %
Lymphocytes Relative: 18 %
Lymphs Abs: 1.9 10*3/uL (ref 0.7–4.0)
MCH: 30.7 pg (ref 26.0–34.0)
MCHC: 33.1 g/dL (ref 30.0–36.0)
MCV: 92.7 fL (ref 80.0–100.0)
Monocytes Absolute: 1.2 10*3/uL — ABNORMAL HIGH (ref 0.1–1.0)
Monocytes Relative: 12 %
Neutro Abs: 6.2 10*3/uL (ref 1.7–7.7)
Neutrophils Relative %: 61 %
Platelets: 236 10*3/uL (ref 150–400)
RBC: 3.58 MIL/uL — ABNORMAL LOW (ref 3.87–5.11)
RDW: 13.2 % (ref 11.5–15.5)
WBC: 10.2 10*3/uL (ref 4.0–10.5)
nRBC: 0 % (ref 0.0–0.2)

## 2023-04-09 LAB — COMPREHENSIVE METABOLIC PANEL
ALT: 21 U/L (ref 0–44)
AST: 25 U/L (ref 15–41)
Albumin: 3.8 g/dL (ref 3.5–5.0)
Alkaline Phosphatase: 71 U/L (ref 38–126)
Anion gap: 6 (ref 5–15)
BUN: 26 mg/dL — ABNORMAL HIGH (ref 6–20)
CO2: 21 mmol/L — ABNORMAL LOW (ref 22–32)
Calcium: 9.4 mg/dL (ref 8.9–10.3)
Chloride: 111 mmol/L (ref 98–111)
Creatinine, Ser: 1.23 mg/dL — ABNORMAL HIGH (ref 0.44–1.00)
GFR, Estimated: 53 mL/min — ABNORMAL LOW (ref >60)
Glucose, Bld: 222 mg/dL — ABNORMAL HIGH (ref 70–99)
Potassium: 4 mmol/L (ref 3.5–5.1)
Sodium: 138 mmol/L (ref 135–145)
Total Bilirubin: 0.8 mg/dL (ref 0.0–1.2)
Total Protein: 7.7 g/dL (ref 6.5–8.1)

## 2023-04-09 MED ORDER — ONDANSETRON 4 MG PO TBDP
4.0000 mg | ORAL_TABLET | Freq: Once | ORAL | Status: AC
  Administered 2023-04-09: 4 mg via ORAL
  Filled 2023-04-09: qty 1

## 2023-04-09 MED ORDER — OXYCODONE-ACETAMINOPHEN 5-325 MG PO TABS
2.0000 | ORAL_TABLET | Freq: Once | ORAL | Status: AC
  Administered 2023-04-09: 2 via ORAL
  Filled 2023-04-09: qty 2

## 2023-04-09 MED ORDER — HYDROCODONE-ACETAMINOPHEN 5-325 MG PO TABS
1.0000 | ORAL_TABLET | Freq: Once | ORAL | Status: AC
  Administered 2023-04-09: 1 via ORAL
  Filled 2023-04-09: qty 1

## 2023-04-09 MED ORDER — AMOXICILLIN-POT CLAVULANATE 875-125 MG PO TABS
1.0000 | ORAL_TABLET | Freq: Once | ORAL | Status: AC
  Administered 2023-04-09: 1 via ORAL
  Filled 2023-04-09: qty 1

## 2023-04-09 MED ORDER — TETANUS-DIPHTH-ACELL PERTUSSIS 5-2.5-18.5 LF-MCG/0.5 IM SUSY
0.5000 mL | PREFILLED_SYRINGE | Freq: Once | INTRAMUSCULAR | Status: AC
  Administered 2023-04-09: 0.5 mL via INTRAMUSCULAR
  Filled 2023-04-09: qty 0.5

## 2023-04-09 MED ORDER — LIDOCAINE HCL (PF) 2 % IJ SOLN: 10.0000 mL | Freq: Once | INTRAMUSCULAR | Status: AC

## 2023-04-09 MED ORDER — LIDOCAINE HCL (PF) 2 % IJ SOLN
INTRAMUSCULAR | Status: AC
  Administered 2023-04-09: 10 mL via INTRADERMAL
  Filled 2023-04-09: qty 5

## 2023-04-09 NOTE — ED Triage Notes (Signed)
Pt in with dog bite to L hand, dorsal and palm side of hand. Pt states she was breaking up a dog fight and bit by her niece's dog (unknown vaccination status). Pt has 2in lac to back on L hand, puncture wounds to L palm. Pt states she had heavy blood loss at home, bleeding controlled without gauze at this time.

## 2023-04-09 NOTE — ED Provider Notes (Signed)
 [place] EMERGENCY DEPARTMENT AT [facility] Provider Note   CSN: [id number] Arrival date & time: 04/09/23  2002     History Chief Complaint  Patient presents with   Animal Bite    Sandra Parsons is a 53 y.o. female.  Patient past history significant for diabetes presents the emergency department concerns of an animal bite.  She states that she sustained animal bite will try to break up a dog fight earlier this evening.  Unknown of dog is up-to-date on vaccinations.  The dog is her niece's dog.  She reports that her hand was in the dog's mouth for approximately 1 minute.  Endorsing pain to the dorsal aspect of the left hand, and pain to the ulnar aspect of the left hand.  Not on blood thinners.  Does not believe she is up-to-date on Tdap.   Animal Bite      Home Medications Prior to Admission medications   Medication Sig Start Date End Date Taking? Authorizing Provider  amoxicillin-clavulanate (AUGMENTIN) 875-125 MG tablet Take 1 tablet by mouth every 12 (twelve) hours. 04/09/23  Yes Maryanna Shape A, PA-C  ondansetron (ZOFRAN-ODT) 4 MG disintegrating tablet Take 1 tablet (4 mg total) by mouth every 8 (eight) hours as needed for nausea or vomiting. 04/09/23  Yes Smitty Knudsen, PA-C  oxyCODONE-acetaminophen (PERCOCET/ROXICET) 5-325 MG tablet Take 1 tablet by mouth every 6 (six) hours as needed for severe pain (pain score 7-10). 04/09/23  Yes Maryanna Shape A, PA-C  amLODipine (NORVASC) 10 MG tablet TAKE 1 Tablet BY MOUTH ONCE EVERY DAY 01/25/23   Jacquelin Hawking, PA-C  atorvastatin (LIPITOR) 20 MG tablet TAKE 1 Tablet BY MOUTH ONCE EVERY DAY 08/30/22   Jacquelin Hawking, PA-C  cyclobenzaprine (FLEXERIL) 10 MG tablet Take 1 tablet (10 mg total) by mouth 2 (two) times daily as needed for muscle spasms. Patient not taking: Reported on 12/15/2022 08/26/22   Sherian Maroon A, PA  gabapentin (NEURONTIN) 100 MG capsule TAKE 1 CAPSULE BY MOUTH TWICE DAILY AS NEEDED 09/20/22   Jacquelin Hawking, PA-C  hydrALAZINE (APRESOLINE) 25 MG tablet Take 25 mg by mouth 2 (two) times daily. 04/14/22 04/14/23  [provider]  insulin glargine (LANTUS SOLOSTAR) 100 UNIT/ML Solostar Pen Inject 25 Units into the skin daily. 07/23/20   Jacquelin Hawking, PA-C  levothyroxine (SYNTHROID) 75 MCG tablet TAKE 1 Tablet BY MOUTH ONCE EVERY DAY 02/07/23   Jacquelin Hawking, PA-C  metFORMIN (GLUCOPHAGE) 500 MG tablet Take 1 tablet (500 mg total) by mouth 2 (two) times daily with a meal. 08/30/22   Jacquelin Hawking, PA-C  methocarbamol (ROBAXIN) 500 MG tablet Take 1 tablet (500 mg total) by mouth 2 (two) times daily. Patient not taking: Reported on 12/31/2021 11/27/21   Prosperi, Christian H, PA-C  sitaGLIPtin (JANUVIA) 50 MG tablet Take 1 tablet (50 mg total) by mouth daily. 08/30/22   Jacquelin Hawking, PA-C  triamcinolone (KENALOG) 0.025 % ointment Apply 1 application topically at bedtime as needed. 06/14/19   Jacquelin Hawking, PA-C      Allergies    Prednisone    Review of Systems   Review of Systems  Physical Exam Updated Vital Signs BP (!) 174/82 (BP Location: Right Arm)   Pulse 95   Temp 98.2 F (36.8 C) (Oral)   Resp 17   Wt 84.5 kg   LMP 04/26/2016   SpO2 99%   BMI 29.18 kg/m  Physical Exam Vitals and nursing note reviewed.  Constitutional:      General:  She is not in acute distress.    Appearance: She is well-developed.  HENT:     Head: Normocephalic and atraumatic.  Eyes:     Conjunctiva/sclera: Conjunctivae normal.  Cardiovascular:     Rate and Rhythm: Normal rate and regular rhythm.     Heart sounds: No murmur heard. Pulmonary:     Effort: Pulmonary effort is normal. No respiratory distress.     Breath sounds: Normal breath sounds.  Abdominal:     Palpations: Abdomen is soft.     Tenderness: There is no abdominal tenderness.  Musculoskeletal:        General: Swelling, tenderness, deformity and signs of injury present.     Cervical back: Neck supple.     Comments:  Notable swelling overlying the dorsal aspect of the left hand.  There is a significant laceration over this area.  Unable to fully assess motion in this hand due to significant pain.  Patient appears to have preserved extension and flexion of digits 1 through 4 in the left hand but digit 5 difficult to assess due to pain over this area.  Concerning possible penetrating injury to the palmar aspect along the ulnar surface of the left hand.  There is slight deformity overlying the fifth metacarpal.  Skin:    General: Skin is warm and dry.     Capillary Refill: Capillary refill takes less than 2 seconds.  Neurological:     Mental Status: She is alert.  Psychiatric:        Mood and Affect: Mood normal.        ED Results / Procedures / Treatments   Labs (all labs ordered are listed, but only abnormal results are displayed) Labs Reviewed  CBC WITH DIFFERENTIAL/PLATELET - Abnormal; Notable for the following components:      Result Value   RBC 3.58 (*)    Hemoglobin 11.0 (*)    HCT 33.2 (*)    Monocytes Absolute 1.2 (*)    Eosinophils Absolute 0.8 (*)    All other components within normal limits  COMPREHENSIVE METABOLIC PANEL - Abnormal; Notable for the following components:   CO2 21 (*)    Glucose, Bld 222 (*)    BUN 26 (*)    Creatinine, Ser 1.23 (*)    GFR, Estimated 53 (*)    All other components within normal limits    EKG None  Radiology DG Hand 2 View Left Result Date: 04/09/2023 CLINICAL DATA:  Reduction of fifth metacarpal fracture EXAM: LEFT HAND - 2 VIEW COMPARISON:  04/09/2023 FINDINGS: Frontal and lateral views of the left hand are obtained. Interval reduction of the fifth metacarpal head fracture, with persistent 3 mm radial translation of the distal fracture fragment. Joint spaces are well preserved. Soft tissue swelling and laceration again seen within the dorsal ulnar aspect of the hand. IMPRESSION: 1. Interval reduction of the fifth metacarpal head fracture, with  persistent 3 mm of radial translation of the distal fracture fragment. Electronically Signed   By: Sharlet Salina M.D.   On: 04/09/2023 23:45   DG Hand Complete Left Result Date: 04/09/2023 CLINICAL DATA:  Dog bite EXAM: LEFT HAND - COMPLETE 3+ VIEW COMPARISON:  None Available. FINDINGS: Frontal, oblique, and lateral views of the left hand are obtained. There is soft tissue swelling with subcutaneous gas along the ulnar and dorsal aspect of the hand consistent with history of dog bite. There is a displaced fracture of the fifth metacarpal head, with radial displacement of the distal  fracture fragment. No other acute bony abnormalities. Joint spaces are well preserved. No radiopaque foreign body. IMPRESSION: 1. Dog bite ulnar aspect of the hand, with associated displaced fracture of the fifth metacarpal head. Electronically Signed   By: Sharlet Salina M.D.   On: 04/09/2023 20:47    Procedures .Laceration Repair  Date/Time: 04/10/2023 1:31 PM  Performed by: Smitty Knudsen, PA-C Authorized by: Smitty Knudsen, PA-C   Consent:    Consent obtained:  Verbal   Consent given by:  Patient   Risks discussed:  Infection, pain, need for additional repair and poor cosmetic result   Alternatives discussed:  Referral Anesthesia:    Anesthesia method:  Local infiltration   Local anesthetic:  Lidocaine 2% w/o epi Laceration details:    Location:  Hand   Hand location:  L hand, dorsum   Length (cm):  5   Depth (mm):  3 Pre-procedure details:    Preparation:  Patient was prepped and draped in usual sterile fashion and imaging obtained to evaluate for foreign bodies Exploration:    Hemostasis achieved with:  Direct pressure   Imaging obtained: x-ray     Imaging outcome: foreign body not noted     Contaminated: yes   Treatment:    Area cleansed with:  Saline and povidone-iodine   Amount of cleaning:  Extensive   Irrigation solution:  Sterile water   Irrigation volume:  1000cc   Irrigation method:   Pressure wash   Visualized foreign bodies/material removed: no   Skin repair:    Repair method:  Sutures   Suture size:  4-0   Suture material:  Nylon   Number of sutures:  2 Approximation:    Approximation:  Loose Repair type:    Repair type:  Intermediate Post-procedure details:    Dressing:  Non-adherent dressing   Procedure completion:  Tolerated .Reduction of fracture  Date/Time: 04/10/2023 1:33 PM  Performed by: Smitty Knudsen, PA-C Authorized by: Smitty Knudsen, PA-C  Consent: Verbal consent obtained. Risks and benefits: risks, benefits and alternatives were discussed Consent given by: patient Patient understanding: patient states understanding of the procedure being performed Patient consent: the patient's understanding of the procedure matches consent given Procedure consent: procedure consent matches procedure scheduled Relevant documents: relevant documents present and verified Test results: test results available and properly labeled Site marked: the operative site was marked Imaging studies: imaging studies available Patient identity confirmed: verbally with patient and arm band Local anesthesia used: yes Anesthesia: hematoma block  Anesthesia: Local anesthesia used: yes Local Anesthetic: lidocaine 2% without epinephrine Anesthetic total: 5 mL  Sedation: Patient sedated: no  Patient tolerance: patient tolerated the procedure well with no immediate complications Comments: Distal metacarpal fracture with displacement with interval improvement following reduction confirmed on repeat imaging       Medications Ordered in ED Medications  Tdap (BOOSTRIX) injection 0.5 mL (0.5 mLs Intramuscular Given 04/09/23 2120)  oxyCODONE-acetaminophen (PERCOCET/ROXICET) 5-325 MG per tablet 2 tablet (2 tablets Oral Given 04/09/23 2119)  lidocaine HCl (PF) (XYLOCAINE) 2 % injection 10 mL (10 mLs Intradermal Given 04/09/23 2301)  ondansetron (ZOFRAN-ODT) disintegrating tablet  4 mg (4 mg Oral Given 04/09/23 2324)  HYDROcodone-acetaminophen (NORCO/VICODIN) 5-325 MG per tablet 1 tablet (1 tablet Oral Given 04/09/23 2324)  amoxicillin-clavulanate (AUGMENTIN) 875-125 MG per tablet 1 tablet (1 tablet Oral Given 04/09/23 2349)    ED Course/ Medical Decision Making/ A&P  Medical Decision Making Amount and/or Complexity of Data Reviewed Labs: ordered. Radiology: ordered.  Risk Prescription drug management.   This patient presents to the ED for concern of animal bite.  Differential diagnosis includes dog bite, laceration, finger dislocation, finger fracture   Lab Tests:  I Ordered, and personally interpreted labs.  The pertinent results include: CBC with signs of anemia, CMP with slight renal impairment creatinine 1.23 and GFR 53 although this is slightly better than patient's baseline.   Imaging Studies ordered:  I ordered imaging studies including x-ray left hand I independently visualized and interpreted imaging which showed dog bite ulnar aspect of the hand, with associated displaced fracture of the fifth metacarpal head. I agree with the radiologist interpretation   Medicines ordered and prescription drug management:  I ordered medication including Percocet, Zofran, Norco, Augmentin, Tdap for pain, nausea, animal bite, Tdap updated Reevaluation of the patient after these medicines showed that the patient improved I have reviewed the patients home medicines and have made adjustments as needed   Problem List / ED Course:  Patient past history significant for diabetes here with concerns of an animal bite.  She states that she tried to take up a dog fight when she was bit by dog with dog holding on her hand for about a minute.  Not on blood thinners.  States that the dog is her niece's dog but is unsure about vaccination status.  States that there was significant bleeding at home but bleeding well-controlled now in the  emergency department. Initially concern is for large laceration to the dorsal aspect of the left hand.  The ulnar aspect also has a penetrating wound with adipose tissue exposed.  There is some finger deformity at the metacarpal side of the fifth metacarpal.  Difficult to assess range of motion due to significant pain.  Given extent of injuries, will treat with Percocet for pain control and update patient's Tdap as patient does not feel that she is up-to-date on her tetanus.  These injuries are rather severe and concerning for possible penetrating injury and with finding of a finger fracture close to penetrating wound, will consult hand surgery. Given penetrating injury and dislocated fifth metacarpal fracture, will consult with hand surgery. Spoke with Payton Mccallum, PA-C with Dr. Yehuda Budd on call for hand surgery who advised irrigation, loose wound closure, and splinting of the left hand with plans to see patient in clinic on Monday. Discussed recommendations from hand surgery with patient and she stated that she is comfortable with plan and consents for treatment. Suture repair performed after thorough irrigation of the hand wound. Two 4-0 Nylon knots placed for loose approximation of the wound which patient tolerated. Irrigated with 1L of sterile water. Hematoma block performed after discussion of this approach vs sedation which patient would prefer block. Manual reduction attempted with interval improvement of the displacement but persistent displacement of the distal metacarpal of the 5th digit remains. Patient splinted. Dose of Augmentin given prior to discharge as well as updated Tdap. Prescription for Augmentin and Percocet sent to home pharmacy for continued use outpatient. Patient advised on return precautions but will plan on seeing hand surgery on Monday. She is otherwise stable and discharged home with family present to drive patient home.   Social Determinants of Health:  History of hepatitis  B and C  Final Clinical Impression(s) / ED Diagnoses Final diagnoses:  Animal bite  Closed displaced fracture of neck of fifth metacarpal bone of left hand, initial encounter  Rx / DC Orders ED Discharge Orders          Ordered    amoxicillin-clavulanate (AUGMENTIN) 875-125 MG tablet  Every 12 hours        04/10/23 0000    oxyCODONE-acetaminophen (PERCOCET/ROXICET) 5-325 MG tablet  Every 6 hours PRN        04/10/23 0000    ondansetron (ZOFRAN-ODT) 4 MG disintegrating tablet  Every 8 hours PRN        04/10/23 0000              Smitty Knudsen, PA-C 04/10/23 1342    Bethann Berkshire, MD 04/11/23 1105

## 2023-04-10 MED ORDER — AMOXICILLIN-POT CLAVULANATE 875-125 MG PO TABS: 1.0000 | ORAL_TABLET | Freq: Two times a day (BID) | ORAL | 0 refills | Status: DC

## 2023-04-10 MED ORDER — ONDANSETRON 4 MG PO TBDP: 4.0000 mg | ORAL_TABLET | Freq: Three times a day (TID) | ORAL | 0 refills | Status: DC | PRN

## 2023-04-10 MED ORDER — OXYCODONE-ACETAMINOPHEN 5-325 MG PO TABS: 1.0000 | ORAL_TABLET | Freq: Four times a day (QID) | ORAL | 0 refills | Status: DC | PRN

## 2023-04-10 NOTE — Discharge Instructions (Signed)
You were seen today after a dog bite. You unfortunately sustained a fracture to the fifth digit of your hand. This was manually moved back into place and 2 sutures were placed into your hand to help keep the wound margin clear. I spoke with Dr. Yehuda Budd and his PA regarding your care and they will see you in their office on Monday morning in [place]. You can reach out to them Monday morning and finalize a time.  You will be taking Augmentin which is an antibiotic to prevent infection from the bite. I have also sent Percocet for pain control. If any signs of infection develop, please return to the ER.

## 2023-04-11 ENCOUNTER — Telehealth: Payer: Self-pay

## 2023-04-11 NOTE — Telephone Encounter (Signed)
Called hospital follow up with [facility] Client as she was recently seen in [facility] ER for a dog bite to her hand. She answered but states she is currently at the Orthopedic that her hand is broken. I will call her tomorrow as she requests as she will be home then.    Francee Nodal RN [employer]

## 2023-04-12 ENCOUNTER — Ambulatory Visit (HOSPITAL_BASED_OUTPATIENT_CLINIC_OR_DEPARTMENT_OTHER): Payer: Self-pay

## 2023-04-12 ENCOUNTER — Encounter (HOSPITAL_BASED_OUTPATIENT_CLINIC_OR_DEPARTMENT_OTHER)
Admission: RE | Disposition: A | Discharge status: Home / Self Care | Payer: Self-pay | Source: Home / Self Care | Attending: Orthopedic Surgery

## 2023-04-12 ENCOUNTER — Ambulatory Visit (HOSPITAL_BASED_OUTPATIENT_CLINIC_OR_DEPARTMENT_OTHER)
Admission: RE | Admit: 2023-04-12 | Discharge: 2023-04-12 | Disposition: A | Discharge status: Home / Self Care | Payer: Self-pay | Attending: Orthopedic Surgery | Admitting: Orthopedic Surgery

## 2023-04-12 ENCOUNTER — Other Ambulatory Visit: Payer: Self-pay

## 2023-04-12 ENCOUNTER — Ambulatory Visit (HOSPITAL_BASED_OUTPATIENT_CLINIC_OR_DEPARTMENT_OTHER): Payer: Self-pay | Admitting: Anesthesiology

## 2023-04-12 ENCOUNTER — Encounter (HOSPITAL_BASED_OUTPATIENT_CLINIC_OR_DEPARTMENT_OTHER): Payer: Self-pay | Admitting: Orthopedic Surgery

## 2023-04-12 DIAGNOSIS — S62337B Displaced fracture of neck of fifth metacarpal bone, left hand, initial encounter for open fracture: Secondary | ICD-10-CM | POA: Diagnosis present

## 2023-04-12 DIAGNOSIS — Z7984 Long term (current) use of oral hypoglycemic drugs: Secondary | ICD-10-CM | POA: Insufficient documentation

## 2023-04-12 DIAGNOSIS — N189 Chronic kidney disease, unspecified: Secondary | ICD-10-CM | POA: Diagnosis not present

## 2023-04-12 DIAGNOSIS — I129 Hypertensive chronic kidney disease with stage 1 through stage 4 chronic kidney disease, or unspecified chronic kidney disease: Secondary | ICD-10-CM | POA: Insufficient documentation

## 2023-04-12 DIAGNOSIS — E1122 Type 2 diabetes mellitus with diabetic chronic kidney disease: Secondary | ICD-10-CM | POA: Insufficient documentation

## 2023-04-12 DIAGNOSIS — Z794 Long term (current) use of insulin: Secondary | ICD-10-CM | POA: Insufficient documentation

## 2023-04-12 DIAGNOSIS — W540XXA Bitten by dog, initial encounter: Secondary | ICD-10-CM | POA: Insufficient documentation

## 2023-04-12 DIAGNOSIS — Z87891 Personal history of nicotine dependence: Secondary | ICD-10-CM

## 2023-04-12 DIAGNOSIS — N183 Chronic kidney disease, stage 3 unspecified: Secondary | ICD-10-CM

## 2023-04-12 DIAGNOSIS — S62307B Unspecified fracture of fifth metacarpal bone, left hand, initial encounter for open fracture: Secondary | ICD-10-CM

## 2023-04-12 HISTORY — PX: OPEN REDUCTION INTERNAL FIXATION (ORIF) METACARPAL: SHX6234

## 2023-04-12 LAB — GLUCOSE, CAPILLARY: Glucose-Capillary: 189 mg/dL — ABNORMAL HIGH (ref 70–99)

## 2023-04-12 SURGERY — OPEN REDUCTION INTERNAL FIXATION (ORIF) METACARPAL
Anesthesia: Monitor Anesthesia Care | Site: Finger | Laterality: Left

## 2023-04-12 MED ORDER — BACITRACIN ZINC 500 UNIT/GM EX OINT
TOPICAL_OINTMENT | CUTANEOUS | Status: AC
  Filled 2023-04-12: qty 28.35

## 2023-04-12 MED ORDER — MIDAZOLAM HCL 2 MG/2ML IJ SOLN
INTRAMUSCULAR | Status: AC
Start: 2023-04-12 — End: ?
  Filled 2023-04-12: qty 2

## 2023-04-12 MED ORDER — DEXAMETHASONE SODIUM PHOSPHATE 10 MG/ML IJ SOLN
INTRAMUSCULAR | Status: DC | PRN
  Administered 2023-04-12: 10 mg via INTRAVENOUS

## 2023-04-12 MED ORDER — OXYCODONE HCL 5 MG/5ML PO SOLN: 5.0000 mg | Freq: Once | ORAL | Status: DC | PRN

## 2023-04-12 MED ORDER — CEFAZOLIN SODIUM-DEXTROSE 2-4 GM/100ML-% IV SOLN
INTRAVENOUS | Status: AC
  Filled 2023-04-12: qty 100

## 2023-04-12 MED ORDER — BACITRACIN ZINC 500 UNIT/GM EX OINT
TOPICAL_OINTMENT | CUTANEOUS | Status: DC | PRN
  Administered 2023-04-12: 1 via TOPICAL

## 2023-04-12 MED ORDER — LIDOCAINE HCL (PF) 1 % IJ SOLN
INTRAMUSCULAR | Status: AC
  Filled 2023-04-12: qty 30

## 2023-04-12 MED ORDER — SODIUM CHLORIDE 0.9 % IR SOLN
Status: DC | PRN
  Administered 2023-04-12: 3000 mL

## 2023-04-12 MED ORDER — ACETAMINOPHEN 10 MG/ML IV SOLN
INTRAVENOUS | Status: DC | PRN
  Administered 2023-04-12: 1000 mg via INTRAVENOUS

## 2023-04-12 MED ORDER — CEFAZOLIN SODIUM-DEXTROSE 2-4 GM/100ML-% IV SOLN
2.0000 g | INTRAVENOUS | Status: AC
  Administered 2023-04-12: 2 g via INTRAVENOUS

## 2023-04-12 MED ORDER — MIDAZOLAM HCL 2 MG/2ML IJ SOLN
2.0000 mg | Freq: Once | INTRAMUSCULAR | Status: AC
  Administered 2023-04-12: 2 mg via INTRAVENOUS

## 2023-04-12 MED ORDER — OXYCODONE-ACETAMINOPHEN 5-325 MG PO TABS: 1.0000 | ORAL_TABLET | Freq: Four times a day (QID) | ORAL | 0 refills | Status: AC | PRN

## 2023-04-12 MED ORDER — FENTANYL CITRATE (PF) 100 MCG/2ML IJ SOLN
100.0000 ug | Freq: Once | INTRAMUSCULAR | Status: AC
  Administered 2023-04-12: 100 ug via INTRAVENOUS

## 2023-04-12 MED ORDER — AMISULPRIDE (ANTIEMETIC) 5 MG/2ML IV SOLN: 10.0000 mg | Freq: Once | INTRAVENOUS | Status: DC | PRN

## 2023-04-12 MED ORDER — ROPIVACAINE HCL 5 MG/ML IJ SOLN
INTRAMUSCULAR | Status: DC | PRN
  Administered 2023-04-12: 30 mL via PERINEURAL

## 2023-04-12 MED ORDER — LACTATED RINGERS IV SOLN: INTRAVENOUS | Status: DC | PRN

## 2023-04-12 MED ORDER — BUPIVACAINE HCL (PF) 0.5 % IJ SOLN
INTRAMUSCULAR | Status: AC
  Filled 2023-04-12: qty 30

## 2023-04-12 MED ORDER — PROPOFOL 10 MG/ML IV BOLUS
INTRAVENOUS | Status: DC | PRN
  Administered 2023-04-12: 100 ug/kg/min via INTRAVENOUS
  Administered 2023-04-12: 150 ug/kg/min via INTRAVENOUS

## 2023-04-12 MED ORDER — 0.9 % SODIUM CHLORIDE (POUR BTL) OPTIME
TOPICAL | Status: DC | PRN
  Administered 2023-04-12: 1000 mL

## 2023-04-12 MED ORDER — OXYCODONE HCL 5 MG PO TABS: 5.0000 mg | ORAL_TABLET | Freq: Once | ORAL | Status: DC | PRN

## 2023-04-12 MED ORDER — LIDOCAINE 2% (20 MG/ML) 5 ML SYRINGE
INTRAMUSCULAR | Status: DC | PRN
  Administered 2023-04-12: 20 mg via INTRAVENOUS

## 2023-04-12 MED ORDER — ACETAMINOPHEN 10 MG/ML IV SOLN
INTRAVENOUS | Status: AC
  Filled 2023-04-12: qty 100

## 2023-04-12 MED ORDER — LACTATED RINGERS IV SOLN: INTRAVENOUS | Status: DC

## 2023-04-12 MED ORDER — ONDANSETRON HCL 4 MG/2ML IJ SOLN: 4.0000 mg | Freq: Once | INTRAMUSCULAR | Status: DC | PRN

## 2023-04-12 MED ORDER — FENTANYL CITRATE (PF) 100 MCG/2ML IJ SOLN
INTRAMUSCULAR | Status: AC
  Filled 2023-04-12: qty 2

## 2023-04-12 MED ORDER — ACETAMINOPHEN 500 MG PO TABS: 1000.0000 mg | ORAL_TABLET | Freq: Once | ORAL | Status: DC

## 2023-04-12 MED ORDER — HYDROMORPHONE HCL 1 MG/ML IJ SOLN: 0.2500 mg | INTRAMUSCULAR | Status: DC | PRN

## 2023-04-12 SURGICAL SUPPLY — 41 items
BLADE SURG 15 STRL LF DISP TIS (BLADE) ×1 IMPLANT
BNDG ELASTIC 4INX 5YD STR LF (GAUZE/BANDAGES/DRESSINGS) ×1 IMPLANT
BNDG ESMARK 4X9 LF (GAUZE/BANDAGES/DRESSINGS) ×1 IMPLANT
COVER BACK TABLE 60X90IN (DRAPES) ×1 IMPLANT
CUFF TOURN SGL QUICK 18X4 (TOURNIQUET CUFF) ×1 IMPLANT
DRAPE EXTREMITY T 121X128X90 (DISPOSABLE) ×1 IMPLANT
DRAPE OEC MINIVIEW 54X84 (DRAPES) ×1 IMPLANT
DRSG EMULSION OIL 3X3 NADH (GAUZE/BANDAGES/DRESSINGS) ×1 IMPLANT
GAUZE SPONGE 4X4 12PLY STRL (GAUZE/BANDAGES/DRESSINGS) ×1 IMPLANT
GLOVE BIO SURGEON STRL SZ 6 (GLOVE) ×1 IMPLANT
GLOVE BIO SURGEON STRL SZ7.5 (GLOVE) ×1 IMPLANT
GLOVE BIOGEL PI IND STRL 6.5 (GLOVE) ×1 IMPLANT
GLOVE BIOGEL PI IND STRL 7.0 (GLOVE) IMPLANT
GLOVE BIOGEL PI IND STRL 7.5 (GLOVE) ×1 IMPLANT
GOWN STRL REUS W/ TWL LRG LVL3 (GOWN DISPOSABLE) ×1 IMPLANT
GOWN STRL REUS W/TWL XL LVL3 (GOWN DISPOSABLE) ×1 IMPLANT
K-WIRE DBL .045X4 NSTRL (WIRE) ×2
KWIRE DBL .045X4 NSTRL (WIRE) IMPLANT
NDL HYPO 22X1.5 SAFETY MO (MISCELLANEOUS) IMPLANT
NEEDLE HYPO 22X1.5 SAFETY MO (MISCELLANEOUS) IMPLANT
NS IRRIG 1000ML POUR BTL (IV SOLUTION) ×1 IMPLANT
PACK BASIN DAY SURGERY FS (CUSTOM PROCEDURE TRAY) ×1 IMPLANT
PAD CAST 4YDX4 CTTN HI CHSV (CAST SUPPLIES) IMPLANT
PADDING CAST ABS COTTON 4X4 ST (CAST SUPPLIES) ×1 IMPLANT
PADDING CAST SYNTHETIC 4X4 STR (CAST SUPPLIES) ×1 IMPLANT
SET IRRIG Y TYPE TUR BLADDER L (SET/KITS/TRAYS/PACK) IMPLANT
SHEET MEDIUM DRAPE 40X70 STRL (DRAPES) ×1 IMPLANT
SLING ARM FOAM STRAP LRG (SOFTGOODS) IMPLANT
SPLINT FIBERGLASS 3X35 (CAST SUPPLIES) IMPLANT
SPLINT FIBERGLASS 4X30 (CAST SUPPLIES) IMPLANT
SUCTION TUBE FRAZIER 12FR DISP (SUCTIONS) IMPLANT
SUT VIC AB 4-0 PS2 18 (SUTURE) ×1 IMPLANT
SUT VICRYL RAPIDE 4/0 PS 2 (SUTURE) ×2 IMPLANT
SYR 10ML LL (SYRINGE) IMPLANT
SYR BULB EAR ULCER 3OZ GRN STR (SYRINGE) ×1 IMPLANT
TAPE SURG TRANSPORE 1 IN (GAUZE/BANDAGES/DRESSINGS) ×1 IMPLANT
TOWEL GREEN STERILE FF (TOWEL DISPOSABLE) ×2 IMPLANT
TRAY DSU PREP LF (CUSTOM PROCEDURE TRAY) ×1 IMPLANT
TUBE CONNECTING 20X1/4 (TUBING) IMPLANT
UNDERPAD 30X36 HEAVY ABSORB (UNDERPADS AND DIAPERS) ×1 IMPLANT
YANKAUER SUCT BULB TIP NO VENT (SUCTIONS) IMPLANT

## 2023-04-12 NOTE — Anesthesia Procedure Notes (Signed)
Anesthesia Regional Block: Supraclavicular block   Pre-Anesthetic Checklist: , timeout performed,  Correct Patient, Correct Site, Correct Laterality,  Correct Procedure, Correct Position, site marked,  Risks and benefits discussed,  Surgical consent,  Pre-op evaluation,  At surgeon's request and post-op pain management  Laterality: Left  Prep: Maximum Sterile Barrier Precautions used, chloraprep       Needles:  Injection technique: Single-shot  Needle Type: Echogenic Stimulator Needle     Needle Length: 9cm  Needle Gauge: 22     Additional Needles:   Procedures:,,,, ultrasound used (permanent image in chart),,    Narrative:  Start time: 04/12/2023 12:40 PM End time: 04/12/2023 12:45 PM Injection made incrementally with aspirations every 5 mL.  Performed by: Personally  Anesthesiologist: Lannie Fields, DO  Additional Notes: Monitors applied. No increased pain on injection. No increased resistance to injection. Injection made in 5cc increments. Good needle visualization. Patient tolerated procedure well.

## 2023-04-12 NOTE — Transfer of Care (Signed)
Immediate Anesthesia Transfer of Care Note  Patient: Media planner  Procedure(s) Performed: OPEN REDUCTION INTERNAL FIXATION (ORIF) METACARPAL (Left: Finger)  Patient Location: PACU  Anesthesia Type:MAC and Regional  Level of Consciousness: awake  Airway & Oxygen Therapy: Patient Spontanous Breathing and Patient connected to face mask oxygen  Post-op Assessment: Report given to RN and Post -op Vital signs reviewed and stable  Post vital signs: Reviewed and stable  Last Vitals:  Vitals Value Taken Time  BP 152/78 04/12/23 1420  Temp    Pulse 89 04/12/23 1423  Resp 18 04/12/23 1423  SpO2 93 % 04/12/23 1423  Vitals shown include unfiled device data.  Last Pain:  Vitals:   04/12/23 1054  TempSrc: Temporal  PainSc: 7       Patients Stated Pain Goal: 4 (04/12/23 1054)  Complications: No notable events documented.

## 2023-04-12 NOTE — Interval H&P Note (Signed)
History and Physical Interval Note:  04/12/2023 12:41 PM  Sandra Parsons  has presented today for surgery, with the diagnosis of Fracture of neck of metacarpal bone.  The various methods of treatment have been discussed with the patient and family. After consideration of risks, benefits and other options for treatment, the patient has consented to  Procedure(s) with comments: OPEN REDUCTION INTERNAL FIXATION (ORIF) METACARPAL (Left) - block and mac as a surgical intervention.  The patient's history has been reviewed, patient examined, no change in status, stable for surgery.  I have reviewed the patient's chart and labs.  Questions were answered to the patient's satisfaction.     Gomez Cleverly

## 2023-04-12 NOTE — Anesthesia Postprocedure Evaluation (Signed)
Anesthesia Post Note  Patient: Product manager) Performed: OPEN REDUCTION INTERNAL FIXATION (ORIF) METACARPAL (Left: Finger)     Patient location during evaluation: PACU Anesthesia Type: Regional and MAC Level of consciousness: awake and alert, oriented and patient cooperative Pain management: pain level controlled Vital Signs Assessment: post-procedure vital signs reviewed and stable Respiratory status: spontaneous breathing, nonlabored ventilation and respiratory function stable Cardiovascular status: blood pressure returned to baseline and stable Postop Assessment: no apparent nausea or vomiting Anesthetic complications: no   No notable events documented.  Last Vitals:  Vitals:   04/12/23 1420 04/12/23 1430  BP:  (!) 174/85  Pulse:  88  Resp:  (!) 21  Temp: (!) 36.2 C   SpO2: 96% 97%    Last Pain:  Vitals:   04/12/23 1420  TempSrc:   PainSc: 0-No pain                 Lannie Fields

## 2023-04-12 NOTE — Progress Notes (Signed)
Assisted Dr. Doroteo Glassman with left, interscalene , ultrasound guided block. Side rails up, monitors on throughout procedure. See vital signs in flow sheet. Tolerated Procedure well.

## 2023-04-12 NOTE — Anesthesia Preprocedure Evaluation (Addendum)
Anesthesia Evaluation  Patient identified by MRN, date of birth, ID band Patient awake    Reviewed: Allergy & Precautions, NPO status , Patient's Chart, lab work & pertinent test results  History of Anesthesia Complications (+) PONV and history of anesthetic complications (did well w/ last surgery carpal tunnel under GA w/ sevo)  Airway Mallampati: III  TM Distance: >3 FB Neck ROM: Full    Dental  (+) Edentulous Upper, Partial Lower   Pulmonary former smoker   Pulmonary exam normal breath sounds clear to auscultation       Cardiovascular hypertension (154/107 preop, per pt normally lower. has not had any meds today), Pt. on medications Normal cardiovascular exam Rhythm:Regular Rate:Normal     Neuro/Psych  PSYCHIATRIC DISORDERS  Depression    negative neurological ROS     GI/Hepatic negative GI ROS,,,(+)       marijuana use, Hepatitis -, B  Endo/Other  diabetes, Well Controlled, Type 2, Insulin Dependent, Oral Hypoglycemic AgentsHypothyroidism    Renal/GU Renal InsufficiencyRenal diseaseCr 1.23  negative genitourinary   Musculoskeletal negative musculoskeletal ROS (+)    Abdominal   Peds  Hematology  (+) Blood dyscrasia, anemia Hb 11   Anesthesia Other Findings   Reproductive/Obstetrics negative OB ROS                              Anesthesia Physical Anesthesia Plan  ASA: 3  Anesthesia Plan: MAC and Regional   Post-op Pain Management: Regional block* and Tylenol PO (pre-op)*   Induction: Intravenous  PONV Risk Score and Plan: 3 and Propofol infusion, TIVA, Midazolam and Treatment may vary due to age or medical condition  Airway Management Planned: Natural Airway and Simple Face Mask  Additional Equipment: None  Intra-op Plan:   Post-operative Plan:   Informed Consent: I have reviewed the patients History and Physical, chart, labs and discussed the procedure including the  risks, benefits and alternatives for the proposed anesthesia with the patient or authorized representative who has indicated his/her understanding and acceptance.     Dental advisory given  Plan Discussed with: CRNA  Anesthesia Plan Comments: (No decadron)        Anesthesia Quick Evaluation

## 2023-04-12 NOTE — H&P (Signed)
Preoperative History & Physical Exam  Surgeon: Philipp Ovens, MD  Diagnosis: Open fracture of left fifth metacarpal   Planned Procedure: Open reduction internal fixation left fifth metacarpal, irrigation and debridement of open fracture  History of Present Illness:   Patient is a 53 y.o. female with symptoms consistent with open left fifth metacarpal fracture who presents for surgical intervention. The risks, benefits and alternatives of surgical intervention were discussed and informed consent was obtained prior to surgery.  Past Medical History:  Past Medical History:  Diagnosis Date   CKD (chronic kidney disease)    Complication of anesthesia    Depression    Diabetes mellitus without complication (HCC)    Hepatitis B    per patient- diagnosed at age 58   Hepatitis C    Hyperlipidemia    Hypertension    Not currently on BP meds   Pinched nerve    PONV (postoperative nausea and vomiting)    Thyroid disease     Past Surgical History:  Past Surgical History:  Procedure Laterality Date   BACK SURGERY     x2   CARPAL TUNNEL RELEASE Right 12/08/2020   Procedure: CARPAL TUNNEL RELEASE;  Surgeon: [name], MD;  Location: [facility];  Service: Orthopedics;  Laterality: Right;   CYSTECTOMY     L forearm   TRIGGER FINGER RELEASE Left 09/01/2020   Procedure: LEFT LONG FINGER  A-1 PULLEY RELEASE;  Surgeon: [name], MD;  Location: [facility];  Service: Orthopedics;  Laterality: Left;  Left long finger trigger release   TRIGGER FINGER RELEASE Right 12/08/2020   Procedure: RELEASE TRIGGER FINGER/A-1 PULLEY;  Surgeon: [name], MD;  Location: [facility];  Service: Orthopedics;  Laterality: Right;  Right index and long finger trigger release    Medications:  Prior to Admission medications   Medication Sig Start Date End Date Taking? Authorizing Provider  amLODipine (NORVASC) 10 MG tablet TAKE 1 Tablet BY MOUTH ONCE EVERY DAY 01/25/23  Yes Jacquelin Hawking, PA-C   amoxicillin-clavulanate (AUGMENTIN) 875-125 MG tablet Take 1 tablet by mouth every 12 (twelve) hours. 04/09/23  Yes Maryanna Shape A, PA-C  atorvastatin (LIPITOR) 20 MG tablet TAKE 1 Tablet BY MOUTH ONCE EVERY DAY 08/30/22  Yes Jacquelin Hawking, PA-C  hydrALAZINE (APRESOLINE) 25 MG tablet Take 25 mg by mouth 2 (two) times daily. 04/14/22 04/14/23 Yes [provider]  insulin glargine (LANTUS SOLOSTAR) 100 UNIT/ML Solostar Pen Inject 25 Units into the skin daily. 07/23/20  Yes Jacquelin Hawking, PA-C  levothyroxine (SYNTHROID) 75 MCG tablet TAKE 1 Tablet BY MOUTH ONCE EVERY DAY 02/07/23  Yes Jacquelin Hawking, PA-C  metFORMIN (GLUCOPHAGE) 500 MG tablet Take 1 tablet (500 mg total) by mouth 2 (two) times daily with a meal. 08/30/22  Yes Jacquelin Hawking, PA-C  oxyCODONE-acetaminophen (PERCOCET/ROXICET) 5-325 MG tablet Take 1 tablet by mouth every 6 (six) hours as needed for severe pain (pain score 7-10). 04/09/23  Yes Maryanna Shape A, PA-C  sitaGLIPtin (JANUVIA) 50 MG tablet Take 1 tablet (50 mg total) by mouth daily. 08/30/22  Yes Jacquelin Hawking, PA-C  cyclobenzaprine (FLEXERIL) 10 MG tablet Take 1 tablet (10 mg total) by mouth 2 (two) times daily as needed for muscle spasms. Patient not taking: Reported on 12/15/2022 08/26/22   Sherian Maroon A, PA  gabapentin (NEURONTIN) 100 MG capsule TAKE 1 CAPSULE BY MOUTH TWICE DAILY AS NEEDED 09/20/22   Jacquelin Hawking, PA-C  methocarbamol (ROBAXIN) 500 MG tablet Take 1 tablet (500 mg total) by  mouth 2 (two) times daily. Patient not taking: Reported on 12/31/2021 11/27/21   Prosperi, Christian H, PA-C  ondansetron (ZOFRAN-ODT) 4 MG disintegrating tablet Take 1 tablet (4 mg total) by mouth every 8 (eight) hours as needed for nausea or vomiting. 04/09/23   Smitty Knudsen, PA-C  triamcinolone (KENALOG) 0.025 % ointment Apply 1 application topically at bedtime as needed. 06/14/19   Jacquelin Hawking, PA-C    Allergies:  Prednisone  Review of Systems: Negative except  per HPI.  Physical Exam: Alert and oriented, NAD Head and neck: no masses, normal alignment CV: pulse intact Pulm: no increased work of breathing, respirations even and unlabored Abdomen: non-distended Extremities: extremities warm and well perfused  LABS: Recent Results (from the past 2160 hours)  Lipid panel     Status: Abnormal   Collection Time: 04/08/23  3:27 PM  Result Value Ref Range   Cholesterol 181 0 - 200 mg/dL   Triglycerides 469 <629 mg/dL   HDL 48 >52 mg/dL   Total CHOL/HDL Ratio 3.8 RATIO   VLDL 29 0 - 40 mg/dL   LDL Cholesterol 841 (H) 0 - 99 mg/dL    Comment:        Total Cholesterol/HDL:CHD Risk Coronary Heart Disease Risk Table                     Men   Women  1/2 Average Risk   3.4   3.3  Average Risk       5.0   4.4  2 X Average Risk   9.6   7.1  3 X Average Risk  23.4   11.0        Use the calculated Patient Ratio above and the CHD Risk Table to determine the patient's CHD Risk.        ATP III CLASSIFICATION (LDL):  <100     mg/dL   Optimal  324-401  mg/dL   Near or Above                    Optimal  130-159  mg/dL   Borderline  027-253  mg/dL   High  >664     mg/dL   Very High Performed at [facility], [address], [place], [place] [address]   Hemoglobin A1c     Status: Abnormal   Collection Time: 04/08/23  3:27 PM  Result Value Ref Range   Hgb A1c MFr Bld 7.9 (H) 4.8 - 5.6 %    Comment: (NOTE) Pre diabetes:          5.7%-6.4%  Diabetes:              >6.4%  Glycemic control for   <7.0% adults with diabetes    Mean Plasma Glucose 180.03 mg/dL    Comment: Performed at [facility], [address], [place], [place] [address]  Comprehensive metabolic panel     Status: Abnormal   Collection Time: 04/08/23  3:27 PM  Result Value Ref Range   Sodium 135 135 - 145 mmol/L   Potassium 3.7 3.5 - 5.1 mmol/L   Chloride 105 98 - 111 mmol/L   CO2 22 22 - 32 mmol/L   Glucose, Bld 108 (H) 70 - 99 mg/dL    Comment: Glucose reference range  applies only to samples taken after fasting for at least 8 hours.   BUN 35 (H) 6 - 20 mg/dL   Creatinine, Ser 6.38 (H) 0.44 - 1.00 mg/dL  Calcium 9.1 8.9 - 10.3 mg/dL   Total Protein 8.0 6.5 - 8.1 g/dL   Albumin 4.2 3.5 - 5.0 g/dL   AST 27 15 - 41 U/L   ALT 23 0 - 44 U/L   Alkaline Phosphatase 74 38 - 126 U/L   Total Bilirubin 0.7 0.0 - 1.2 mg/dL   GFR, Estimated 38 (L) >60 mL/min    Comment: (NOTE) Calculated using the CKD-EPI Creatinine Equation (2021)    Anion gap 8 5 - 15    Comment: Performed at [facility], [address], [place], [place] [address]  TSH     Status: Abnormal   Collection Time: 04/08/23  3:27 PM  Result Value Ref Range   TSH 38.902 (H) 0.350 - 4.500 uIU/mL    Comment: Performed by a 3rd Generation assay with a functional sensitivity of <=0.01 uIU/mL. Performed at [facility], [address] [place], [place] [address]   Renal function panel     Status: Abnormal   Collection Time: 04/08/23  3:29 PM  Result Value Ref Range   Sodium 136 135 - 145 mmol/L   Potassium 3.7 3.5 - 5.1 mmol/L   Chloride 105 98 - 111 mmol/L   CO2 22 22 - 32 mmol/L   Glucose, Bld 110 (H) 70 - 99 mg/dL    Comment: Glucose reference range applies only to samples taken after fasting for at least 8 hours.   BUN 35 (H) 6 - 20 mg/dL   Creatinine, Ser 0.98 (H) 0.44 - 1.00 mg/dL   Calcium 9.1 8.9 - 11.9 mg/dL   Phosphorus 3.7 2.5 - 4.6 mg/dL   Albumin 4.0 3.5 - 5.0 g/dL   GFR, Estimated 39 (L) >60 mL/min    Comment: (NOTE) Calculated using the CKD-EPI Creatinine Equation (2021)    Anion gap 9 5 - 15    Comment: Performed at [facility], [address], [place], [place] [address]  CBC     Status: Abnormal   Collection Time: 04/08/23  3:29 PM  Result Value Ref Range   WBC 8.6 4.0 - 10.5 K/uL   RBC 3.63 (L) 3.87 - 5.11 MIL/uL   Hemoglobin 11.4 (L) 12.0 - 15.0 g/dL   HCT 95.6 (L) 21.3 - 08.6 %   MCV 93.1 80.0 - 100.0 fL   MCH 31.4 26.0 - 34.0 pg   MCHC 33.7 30.0 - 36.0 g/dL   RDW  57.8 46.9 - 62.9 %   Platelets 255 150 - 400 K/uL   nRBC 0.0 0.0 - 0.2 %    Comment: Performed at [facility], [address] [place], [place] [address]  Protein / creatinine ratio, urine     Status: Abnormal   Collection Time: 04/08/23  3:30 PM  Result Value Ref Range   Creatinine, Urine 257 mg/dL   Total Protein, Urine 144 mg/dL    Comment: NO NORMAL RANGE ESTABLISHED FOR THIS TEST   Protein Creatinine Ratio 0.56 (H) 0.00 - 0.15 mg/mg[Cre]    Comment: Performed at [facility], [address], [place], [place] [address]  CBC with Differential     Status: Abnormal   Collection Time: 04/09/23  9:48 PM  Result Value Ref Range   WBC 10.2 4.0 - 10.5 K/uL   RBC 3.58 (L) 3.87 - 5.11 MIL/uL   Hemoglobin 11.0 (L) 12.0 - 15.0 g/dL   HCT 10.2 (L) 72.5 - 36.6 %   MCV 92.7 80.0 - 100.0 fL   MCH 30.7 26.0 - 34.0 pg  MCHC 33.1 30.0 - 36.0 g/dL   RDW 16.1 09.6 - 04.5 %   Platelets 236 150 - 400 K/uL   nRBC 0.0 0.0 - 0.2 %   Neutrophils Relative % 61 %   Neutro Abs 6.2 1.7 - 7.7 K/uL   Lymphocytes Relative 18 %   Lymphs Abs 1.9 0.7 - 4.0 K/uL   Monocytes Relative 12 %   Monocytes Absolute 1.2 (H) 0.1 - 1.0 K/uL   Eosinophils Relative 8 %   Eosinophils Absolute 0.8 (H) 0.0 - 0.5 K/uL   Basophils Relative 1 %   Basophils Absolute 0.1 0.0 - 0.1 K/uL   Immature Granulocytes 0 %   Abs Immature Granulocytes 0.01 0.00 - 0.07 K/uL    Comment: Performed at [facility], [address], [place], [place] [address]  Comprehensive metabolic panel     Status: Abnormal   Collection Time: 04/09/23  9:48 PM  Result Value Ref Range   Sodium 138 135 - 145 mmol/L   Potassium 4.0 3.5 - 5.1 mmol/L   Chloride 111 98 - 111 mmol/L   CO2 21 (L) 22 - 32 mmol/L   Glucose, Bld 222 (H) 70 - 99 mg/dL    Comment: Glucose reference range applies only to samples taken after fasting for at least 8 hours.   BUN 26 (H) 6 - 20 mg/dL   Creatinine, Ser 1.91 (H) 0.44 - 1.00 mg/dL   Calcium 9.4 8.9 - 47.8 mg/dL   Total  Protein 7.7 6.5 - 8.1 g/dL   Albumin 3.8 3.5 - 5.0 g/dL   AST 25 15 - 41 U/L   ALT 21 0 - 44 U/L   Alkaline Phosphatase 71 38 - 126 U/L   Total Bilirubin 0.8 0.0 - 1.2 mg/dL   GFR, Estimated 53 (L) >60 mL/min    Comment: (NOTE) Calculated using the CKD-EPI Creatinine Equation (2021)    Anion gap 6 5 - 15    Comment: Performed at [facility], [address] [place], [place] [address]  Glucose, capillary     Status: Abnormal   Collection Time: 04/12/23 10:56 AM  Result Value Ref Range   Glucose-Capillary 189 (H) 70 - 99 mg/dL    Comment: Glucose reference range applies only to samples taken after fasting for at least 8 hours.     Complete History and Physical exam available in the office notes  Sandra Parsons Demetris Capell

## 2023-04-12 NOTE — Discharge Instructions (Addendum)
  Orthopaedic Hand Surgery Discharge Instructions  WEIGHT BEARING STATUS: Non weight bearing on operative extremity  DRESSING CARE: Please keep your dressing/splint/cast clean and dry until your follow-up appointment. You may shower by placing a waterproof covering over your dressing/splint/cast. Contact your surgeon if your splint/cast gets wet. It will need to be changed to prevent skin breakdown.  PAIN CONTROL: First line medications for post operative pain control are Tylenol (acetaminophen) and Motrin (ibuprofen) if you are able to take these medications. If you have been prescribed a medication these can be taken as breakthrough pain medications. Please note that some narcotic pain medication has acetaminophen added and you should never consume more than 4,000mg  of acetaminophen in 24-hour period. Please note that if you are given Toradol (ketorolac) you should not take similar medications such as ibuprofen or naproxen.  DISCHARGE MEDICATIONS: If you have been prescribed medication it was sent electronically to your pharmacy. No changes have been made to your home medications.  ICE/ELEVATION: Ice and elevate your injured extremity as needed. Avoid direct contact of ice with skin.   BANDAGE FEELS TOO TIGHT: If your bandage feels too tight, first make sure you are elevating your fingers as much as possible. The outer layer of the bandage can be unwrapped and reapplied more loosely. If no improvement, you may carefully cut the inner layer longitudinally until the pressure has resolved and then rewrap the outer layer. If you are not comfortable with these instructions, please call the office and the bandage can be changed for you.   FOLLOW UP: You will be called after surgery with an appointment date and time, however if you have not received a phone call within 3 days, please call during regular office hours at [phone] to schedule a post operative appointment.  Please Seek Medical Attention  if: Call MD for: pain or pressure in chest, jaw, arm, back, neck  Call MD for: temperature greater than 101 F for more than 24 hrs Call MD for: difficulty breathing Call MD for: incision redness, bleeding, drainage  Call MD for: palpitations or feeling that the heart is racing  Call MD for: increased swelling in arm, leg, ankle, or abdomen  Call MD for: lightheadedness, dizziness, fainting Call 911 or go to ER for any medical emergency if you are not able to get in touch with your doctor   J. Standley Dakins, MD Orthopaedic Hand Surgeon [facility] Office number: [phone] [address] [place], [place] [address]  No Tylenol until 7:20pm

## 2023-04-12 NOTE — Op Note (Signed)
OPERATIVE NOTE  DATE OF PROCEDURE: 04/12/2023  SURGEONS:  Primary: Gomez Cleverly, MD  PREOPERATIVE DIAGNOSIS: Fracture of neck of metacarpal bone, left hand, open fracture, dog bite, traumatic laceration 8 cm length dorsal hand, 4cm volar fifth digit 4 cm  POSTOPERATIVE DIAGNOSIS: Same  NAME OF PROCEDURE:   ***  ANESTHESIA: Regional  SKIN PREPARATION: Hibiclens  ESTIMATED BLOOD LOSS: Minimal***  IMPLANTS: ***  INDICATIONS:  Sandra Parsons is a 53 y.o. female who has the above preoperative diagnosis. The patient has decided to proceed with surgical intervention.  Risks, benefits and alternatives of operative management were discussed including, but not limited to, risks of anesthesia complications, infection, pain, persistent symptoms, stiffness, need for future surgery.  The patient understands, agrees and elects to proceed with surgery.    DESCRIPTION OF PROCEDURE: The patient was met in the pre-operative area and their identity was verified.  The operative location and laterality was also verified and marked.  The patient was brought to the OR and was placed supine on the table.  After repeat patient identification with the operative team anesthesia was provided and the patient was prepped and draped in the usual sterile fashion.  A final timeout was performed verifying the correction patient, procedure, location and laterality.  ***   Philipp Ovens, MD

## 2023-04-13 ENCOUNTER — Encounter (HOSPITAL_BASED_OUTPATIENT_CLINIC_OR_DEPARTMENT_OTHER): Payer: Self-pay | Admitting: Orthopedic Surgery

## 2023-04-15 ENCOUNTER — Telehealth: Payer: Self-pay

## 2023-04-15 NOTE — Telephone Encounter (Signed)
Called for hospital follow up today. Noted client of [facility] was seen in ER on 04/09/23 for a dog bite, from this incident was also referred to Ortho due to a fracture, she had surgery on 04/12/23 she is not sure how long she will be out of work, possibly at least 2 months. She states her niece's dog and hers were fighting over a treat and she got close and the niece's dog bit her. In addition to surgery she also has the dog bit that needs to heal.  She is uncertain how long this process will take to heal and when she would be back to work.  Medications: she currently has all of her medications, but her Med[employer]ist expired 03/23/23 and [facility] expired 04/13/23. We discussed documents needed to renew and needed to apply for [employer]. She will need to call her boss to get her last 4 paystubs, her bank statements are online on her phone and she did file 2023 taxes and will have her ID. We discussed trying to call today her boss to start that process and to get her MedAssist renewed. Do not want her running out of medications and she states understanding.  She asked about Medicaid and we talked about seeing how long she would be out of work and that [place] or [place] could help her navigate that. She asked about emergency Medicaid and I was able to explain about emergency Medicaid covers only time during a life/death emergency while in ICU and for the time a person was listed as critical, life threatening. Discussed we could look at regular Medicaid once she is in to renew. She states she will try to get her within the next two weeks. She has the number to call if she has any questions for [facility].  She missed her last [facility] appointment, encouraged her to call next week to reschedule so that provider there could also follow her for her health and medication management.  She states she follows up with her surgeon within the next 2 weeks but uncertain of date and appointment not showing in EPIC. She is  unsure if the surgeon was a [facility] provider.  Will continue to follow up as needed. Discussed the food market at [employer] if that would help her while she is out of work.  Francee Nodal RN [employer]

## 2023-04-25 ENCOUNTER — Telehealth: Payer: Self-pay

## 2023-04-25 DIAGNOSIS — S62337B Displaced fracture of neck of fifth metacarpal bone, left hand, initial encounter for open fracture: Secondary | ICD-10-CM | POA: Diagnosis not present

## 2023-04-25 DIAGNOSIS — G8918 Other acute postprocedural pain: Secondary | ICD-10-CM | POA: Diagnosis not present

## 2023-04-25 NOTE — Telephone Encounter (Signed)
Called today to follow up with client who had a visit today with her Orthopedist. She states they said it was healing well and placed her in a cast today.  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  She is still awaiting about her [facility] appointment is tentative for this week.  She states no needs today and is aware to call if she has any questions or needs.  Sandra Nodal RN [employer]

## 2023-04-27 ENCOUNTER — Other Ambulatory Visit: Payer: Self-pay | Admitting: Physician Assistant

## 2023-04-27 ENCOUNTER — Ambulatory Visit: Payer: Self-pay | Admitting: Physician Assistant

## 2023-04-27 ENCOUNTER — Encounter: Payer: Self-pay | Admitting: Physician Assistant

## 2023-04-27 VITALS — BP 144/83 | HR 74 | Temp 97.2°F

## 2023-04-27 DIAGNOSIS — E039 Hypothyroidism, unspecified: Secondary | ICD-10-CM

## 2023-04-27 DIAGNOSIS — E785 Hyperlipidemia, unspecified: Secondary | ICD-10-CM

## 2023-04-27 DIAGNOSIS — I10 Essential (primary) hypertension: Secondary | ICD-10-CM

## 2023-04-27 DIAGNOSIS — E1165 Type 2 diabetes mellitus with hyperglycemia: Secondary | ICD-10-CM

## 2023-04-27 DIAGNOSIS — N189 Chronic kidney disease, unspecified: Secondary | ICD-10-CM

## 2023-04-27 MED ORDER — AMLODIPINE BESYLATE 10 MG PO TABS: 10.0000 mg | ORAL_TABLET | Freq: Every day | ORAL | 1 refills | Status: DC

## 2023-04-27 MED ORDER — HYDRALAZINE HCL 25 MG PO TABS: 25.0000 mg | ORAL_TABLET | Freq: Two times a day (BID) | ORAL | 1 refills | Status: DC

## 2023-04-27 MED ORDER — LEVOTHYROXINE SODIUM 100 MCG PO TABS: 100.0000 ug | ORAL_TABLET | Freq: Every day | ORAL | 1 refills | Status: DC

## 2023-04-27 MED ORDER — ATORVASTATIN CALCIUM 20 MG PO TABS: ORAL_TABLET | ORAL | 1 refills | Status: DC

## 2023-04-27 MED ORDER — LANTUS SOLOSTAR 100 UNIT/ML ~~LOC~~ SOPN: 28.0000 [IU] | PEN_INJECTOR | Freq: Every day | SUBCUTANEOUS | Status: DC

## 2023-04-27 MED ORDER — SITAGLIPTIN PHOSPHATE 50 MG PO TABS: 50.0000 mg | ORAL_TABLET | Freq: Every day | ORAL | 1 refills | Status: DC

## 2023-04-27 MED ORDER — METFORMIN HCL 500 MG PO TABS: 500.0000 mg | ORAL_TABLET | Freq: Two times a day (BID) | ORAL | 1 refills | Status: DC

## 2023-04-27 MED ORDER — CARVEDILOL 6.25 MG PO TABS: 6.2500 mg | ORAL_TABLET | Freq: Two times a day (BID) | ORAL | 1 refills | Status: DC

## 2023-04-27 NOTE — Patient Instructions (Signed)
 Dr Carrolyn Clan Thursday February 27 at 3:00pm

## 2023-04-27 NOTE — Progress Notes (Signed)
 BP (!) 144/83   Pulse 74   Temp (!) 97.2 F (36.2 C)   LMP 04/26/2016   SpO2 97%    Subjective:    Patient ID: Sandra Parsons, female    DOB: 1970/11/01, 53 y.o.   MRN: [id number]  HPI: Sandra Parsons is a 53 y.o. female presenting on 04/27/2023 for Diabetes, Hypertension, and Hyperlipidemia   HPI    Chief Complaint  Patient presents with   Diabetes   Hypertension   Hyperlipidemia    Pt had surgery recently due to Dog bite.  She had f/u with orthopedics Monday/2 d ago She Missed last appt with nephrologist due to hand surgery  She says her bs have been up last 3 months or so   Relevant past medical, surgical, family and social history reviewed and updated as indicated. Interim medical history since our last visit reviewed. Allergies and medications reviewed and updated.   Current Outpatient Medications:    amLODipine  (NORVASC ) 10 MG tablet, TAKE 1 Tablet BY MOUTH ONCE EVERY DAY, Disp: 90 tablet, Rfl: 0   atorvastatin  (LIPITOR) 20 MG tablet, TAKE 1 Tablet BY MOUTH ONCE EVERY DAY, Disp: 90 tablet, Rfl: 1   carvedilol  (COREG ) 6.25 MG tablet, Take 6.25 mg by mouth 2 (two) times daily with a meal., Disp: , Rfl:    insulin glargine  (LANTUS  SOLOSTAR) 100 UNIT/ML Solostar Pen, Inject 25 Units into the skin daily., Disp: 15 mL, Rfl: 0   levothyroxine  (SYNTHROID ) 75 MCG tablet, TAKE 1 Tablet BY MOUTH ONCE EVERY DAY, Disp: 30 tablet, Rfl: 1   metFORMIN  (GLUCOPHAGE ) 500 MG tablet, Take 1 tablet (500 mg total) by mouth 2 (two) times daily with a meal., Disp: 180 tablet, Rfl: 1   sitaGLIPtin  (JANUVIA ) 50 MG tablet, Take 1 tablet (50 mg total) by mouth daily., Disp: 90 tablet, Rfl: 1   cyclobenzaprine  (FLEXERIL ) 10 MG tablet, Take 1 tablet (10 mg total) by mouth 2 (two) times daily as needed for muscle spasms. (Patient not taking: Reported on 12/15/2022), Disp: 20 tablet, Rfl: 0   gabapentin  (NEURONTIN ) 100 MG capsule, TAKE 1 CAPSULE BY MOUTH TWICE DAILY AS NEEDED, Disp: 60 capsule,  Rfl: 0   hydrALAZINE  (APRESOLINE ) 25 MG tablet, Take 25 mg by mouth 2 (two) times daily. (Patient not taking: Reported on 04/27/2023), Disp: , Rfl:    methocarbamol  (ROBAXIN ) 500 MG tablet, Take 1 tablet (500 mg total) by mouth 2 (two) times daily. (Patient not taking: Reported on 12/31/2021), Disp: 20 tablet, Rfl: 0   ondansetron  (ZOFRAN -ODT) 4 MG disintegrating tablet, Take 1 tablet (4 mg total) by mouth every 8 (eight) hours as needed for nausea or vomiting., Disp: 20 tablet, Rfl: 0   triamcinolone  (KENALOG ) 0.025 % ointment, Apply 1 application topically at bedtime as needed., Disp: 15 g, Rfl: 1    Review of Systems  Per HPI unless specifically indicated above     Objective:    BP (!) 144/83   Pulse 74   Temp (!) 97.2 F (36.2 C)   LMP 04/26/2016   SpO2 97%   Wt Readings from Last 3 Encounters:  04/12/23 192 lb 14.4 oz (87.5 kg)  04/09/23 186 lb 4.6 oz (84.5 kg)  12/15/22 186 lb 4 oz (84.5 kg)    Physical Exam Vitals reviewed.  Constitutional:      General: She is not in acute distress.    Appearance: She is well-developed. She is not toxic-appearing.  HENT:     Head: Normocephalic and atraumatic.  Cardiovascular:  Rate and Rhythm: Normal rate and regular rhythm.  Pulmonary:     Effort: Pulmonary effort is normal.     Breath sounds: Normal breath sounds.  Abdominal:     General: Bowel sounds are normal.     Palpations: Abdomen is soft. There is no mass.     Tenderness: There is no abdominal tenderness.  Musculoskeletal:     Cervical back: Neck supple.     Right lower leg: No edema.     Left lower leg: No edema.     Comments: Cast left hand/wrist  Lymphadenopathy:     Cervical: No cervical adenopathy.  Skin:    General: Skin is warm and dry.  Neurological:     Mental Status: She is alert and oriented to person, place, and time.  Psychiatric:        Behavior: Behavior normal.           Assessment & Plan:    Encounter Diagnoses  Name Primary?    Uncontrolled type 2 diabetes mellitus with hyperglycemia (HCC) Yes   Hypothyroidism, unspecified type    Hyperlipidemia, unspecified hyperlipidemia type    Chronic kidney disease, unspecified CKD stage    Essential hypertension      -reviewed labs with pt -Restart hydralazine  -increase levothyroxine  -Increase lantus  to 28u every day -monitor bs.  Contact office for fbs > 200 -follow up with Nephrology and orthopedics per their recomendations -F/u here  3 months. She is to contact office sooner prn

## 2023-05-13 ENCOUNTER — Other Ambulatory Visit (HOSPITAL_COMMUNITY)
Admission: RE | Admit: 2023-05-13 | Discharge: 2023-05-13 | Disposition: A | Discharge status: Home / Self Care | Payer: Self-pay | Source: Ambulatory Visit | Attending: Nephrology | Admitting: Nephrology

## 2023-05-13 DIAGNOSIS — N189 Chronic kidney disease, unspecified: Secondary | ICD-10-CM | POA: Diagnosis not present

## 2023-05-13 DIAGNOSIS — E1122 Type 2 diabetes mellitus with diabetic chronic kidney disease: Secondary | ICD-10-CM | POA: Diagnosis not present

## 2023-05-13 DIAGNOSIS — D638 Anemia in other chronic diseases classified elsewhere: Secondary | ICD-10-CM | POA: Insufficient documentation

## 2023-05-13 DIAGNOSIS — R801 Persistent proteinuria, unspecified: Secondary | ICD-10-CM | POA: Insufficient documentation

## 2023-05-13 LAB — PROTEIN / CREATININE RATIO, URINE
Creatinine, Urine: 487 mg/dL
Protein Creatinine Ratio: 1.85 mg/mg{creat} — ABNORMAL HIGH (ref 0.00–0.15)
Total Protein, Urine: 900 mg/dL

## 2023-05-13 LAB — CBC
HCT: 35.7 % — ABNORMAL LOW (ref 36.0–46.0)
Hemoglobin: 11.9 g/dL — ABNORMAL LOW (ref 12.0–15.0)
MCH: 31.3 pg (ref 26.0–34.0)
MCHC: 33.3 g/dL (ref 30.0–36.0)
MCV: 93.9 fL (ref 80.0–100.0)
Platelets: 256 10*3/uL (ref 150–400)
RBC: 3.8 MIL/uL — ABNORMAL LOW (ref 3.87–5.11)
RDW: 13.5 % (ref 11.5–15.5)
WBC: 9.7 10*3/uL (ref 4.0–10.5)
nRBC: 0 % (ref 0.0–0.2)

## 2023-05-13 LAB — RENAL FUNCTION PANEL
Albumin: 4.2 g/dL (ref 3.5–5.0)
Anion gap: 9 (ref 5–15)
BUN: 23 mg/dL — ABNORMAL HIGH (ref 6–20)
CO2: 23 mmol/L (ref 22–32)
Calcium: 9.5 mg/dL (ref 8.9–10.3)
Chloride: 106 mmol/L (ref 98–111)
Creatinine, Ser: 1.35 mg/dL — ABNORMAL HIGH (ref 0.44–1.00)
GFR, Estimated: 48 mL/min — ABNORMAL LOW (ref >60)
Glucose, Bld: 161 mg/dL — ABNORMAL HIGH (ref 70–99)
Phosphorus: 3.7 mg/dL (ref 2.5–4.6)
Potassium: 3.8 mmol/L (ref 3.5–5.1)
Sodium: 138 mmol/L (ref 135–145)

## 2023-05-16 DIAGNOSIS — G8918 Other acute postprocedural pain: Secondary | ICD-10-CM | POA: Diagnosis not present

## 2023-05-16 DIAGNOSIS — S62337B Displaced fracture of neck of fifth metacarpal bone, left hand, initial encounter for open fracture: Secondary | ICD-10-CM | POA: Diagnosis not present

## 2023-05-17 ENCOUNTER — Other Ambulatory Visit: Payer: Self-pay | Admitting: Physician Assistant

## 2023-05-17 MED ORDER — ATORVASTATIN CALCIUM 20 MG PO TABS: ORAL_TABLET | ORAL | 1 refills | Status: DC

## 2023-05-17 MED ORDER — AMLODIPINE BESYLATE 10 MG PO TABS
10.0000 mg | ORAL_TABLET | Freq: Every day | ORAL | 1 refills | Status: DC
Start: 2023-05-17 — End: 2023-08-18

## 2023-05-17 MED ORDER — LEVOTHYROXINE SODIUM 100 MCG PO TABS: 100.0000 ug | ORAL_TABLET | Freq: Every day | ORAL | 1 refills | Status: DC

## 2023-05-26 DIAGNOSIS — S62337B Displaced fracture of neck of fifth metacarpal bone, left hand, initial encounter for open fracture: Secondary | ICD-10-CM | POA: Diagnosis not present

## 2023-06-03 ENCOUNTER — Telehealth: Payer: Self-pay

## 2023-06-03 NOTE — Telephone Encounter (Signed)
 Called [facility] client to discuss that she has been approved for [facility] Medicaid, plan [facility].  She was at work, so briefly discussed she will need to find a new provider and that we keep a list of providers accepting new patients and we could also provide her with a copy of her approval that has her ID number and phone number to customer service for [facility]. She states if she can come by after work today to discuss further she will , if not it would be Monday. She states she is excited, but will miss her provider at [facility] also.  Will leave information for her on my door so any staff member may be able to assist her as needed.   Francee Nodal RN [employer]

## 2023-06-08 DIAGNOSIS — S62337D Displaced fracture of neck of fifth metacarpal bone, left hand, subsequent encounter for fracture with routine healing: Secondary | ICD-10-CM | POA: Diagnosis not present

## 2023-06-13 ENCOUNTER — Ambulatory Visit (HOSPITAL_COMMUNITY): Attending: Occupational Therapy | Admitting: Occupational Therapy

## 2023-07-11 DIAGNOSIS — M79642 Pain in left hand: Secondary | ICD-10-CM | POA: Diagnosis not present

## 2023-07-11 DIAGNOSIS — S62337B Displaced fracture of neck of fifth metacarpal bone, left hand, initial encounter for open fracture: Secondary | ICD-10-CM | POA: Diagnosis not present

## 2023-07-20 ENCOUNTER — Other Ambulatory Visit: Payer: Self-pay

## 2023-07-20 ENCOUNTER — Encounter (HOSPITAL_COMMUNITY): Payer: Self-pay | Admitting: Occupational Therapy

## 2023-07-20 ENCOUNTER — Ambulatory Visit (HOSPITAL_COMMUNITY): Attending: Occupational Therapy | Admitting: Occupational Therapy

## 2023-07-20 DIAGNOSIS — M25642 Stiffness of left hand, not elsewhere classified: Secondary | ICD-10-CM | POA: Insufficient documentation

## 2023-07-20 DIAGNOSIS — R29898 Other symptoms and signs involving the musculoskeletal system: Secondary | ICD-10-CM | POA: Insufficient documentation

## 2023-07-20 DIAGNOSIS — R600 Localized edema: Secondary | ICD-10-CM | POA: Insufficient documentation

## 2023-07-20 DIAGNOSIS — M79642 Pain in left hand: Secondary | ICD-10-CM | POA: Diagnosis present

## 2023-07-20 NOTE — Patient Instructions (Signed)

## 2023-07-20 NOTE — Therapy (Signed)
 OUTPATIENT OCCUPATIONAL THERAPY ORTHO EVALUATION  Patient Name: Sandra Parsons MRN: [id number] DOB:[birth year], 53 y.o., female Today's Date: 07/20/2023    END OF SESSION:  OT End of Session - 07/20/23 1502     Visit Number 1    Number of Visits 16    Date for OT Re-Evaluation 09/18/23   progress note 08/18/23   Authorization Type [employer]    Authorization Time Period 30 visit limit combined PT/OT/ST    Authorization - Visit Number 0    Authorization - Number of Visits 30    OT Start Time 1433    OT Stop Time 1500    OT Time Calculation (min) 27 min    Activity Tolerance Patient tolerated treatment well    Behavior During Therapy WFL for tasks assessed/performed             Past Medical History:  Diagnosis Date   CKD (chronic kidney disease)    Complication of anesthesia    Depression    Diabetes mellitus without complication (HCC)    Hepatitis B    per patient- diagnosed at age 4   Hepatitis C    Hyperlipidemia    Hypertension    Not currently on BP meds   Pinched nerve    PONV (postoperative nausea and vomiting)    Thyroid  disease    Past Surgical History:  Procedure Laterality Date   BACK SURGERY     x2   CARPAL TUNNEL RELEASE Right 12/08/2020   Procedure: CARPAL TUNNEL RELEASE;  Surgeon: Tonita Frater, MD;  Location: [facility];  Service: Orthopedics;  Laterality: Right;   CYSTECTOMY     L forearm   OPEN REDUCTION INTERNAL FIXATION (ORIF) METACARPAL Left 04/12/2023   Procedure: OPEN REDUCTION INTERNAL FIXATION (ORIF) METACARPAL;  Surgeon: Ltanya Rummer, MD;  Location: [facility];  Service: Orthopedics;  Laterality: Left;  block and mac   TRIGGER FINGER RELEASE Left 09/01/2020   Procedure: LEFT LONG FINGER  A-1 PULLEY RELEASE;  Surgeon: Tonita Frater, MD;  Location: [facility];  Service: Orthopedics;  Laterality: Left;  Left long finger trigger release   TRIGGER FINGER RELEASE Right 12/08/2020   Procedure: RELEASE TRIGGER FINGER/A-1 PULLEY;   Surgeon: Tonita Frater, MD;  Location: [facility];  Service: Orthopedics;  Laterality: Right;  Right index and long finger trigger release   Patient Active Problem List   Diagnosis Date Noted   CKD (chronic kidney disease) stage 3, GFR 30-59 ml/min (HCC) 04/26/2022   Hypothyroidism 04/26/2022   Positive occult stool blood test 06/03/2021   Anemia 06/03/2021   Diabetic retinopathy (HCC) 04/28/2020   Positive hepatitis C antibody test 12/03/2019   Non-intractable vomiting 12/03/2019   PCP: None REFERRING PROVIDER: Dr. Ltanya Rummer  ONSET DATE: 04/12/23  REFERRING DIAG: H96.222L (ICD-10-CM) - Displaced fracture of neck of fifth metacarpal bone, left hand, initial encounter for open fracture  THERAPY DIAG:  Pain in left hand  Stiffness of left hand, not elsewhere classified  Other symptoms and signs involving the musculoskeletal system  Localized edema  Rationale for Evaluation and Treatment: Rehabilitation  SUBJECTIVE:   SUBJECTIVE STATEMENT: S: "I was supposed to begin therapy a while ago but when they removed the pins it unset the bone." Pt accompanied by: self  PERTINENT HISTORY: Pt is a 53 y/o female who sustained a dog bite on 04/09/23 and underwent fifth metacarpal ORIF on 04/12/23. Due to nature of injury, dog bite, unable to fixate with plates internally. External pins  were removed, however bone shifted therefore pt was placed back in a cast for an additional 4 weeks. Pt's bone now showing early healing.   Left fifth metacarpal neck open fracture I&D Left fifth metacarpal neck open fracture ORIF Left hand adjacent tissue transfer <10 sq cm for primary closure of soft tissue defect due to dog bite 4 view radiographs of the left hand with intraoperative interpreted Eischen.  PRECAUTIONS: Other: [place]  Handbook    WEIGHT BEARING RESTRICTIONS: Yes NWB  PAIN:  Are you having pain? No  FALLS: Has patient fallen in last 6 months? No  PLOF: Independent  PATIENT GOALS: To  be able to use her left hand.   NEXT MD VISIT: May 29th  OBJECTIVE:  Note: Objective measures were completed at Evaluation unless otherwise noted.  HAND DOMINANCE: Right  ADLs: Pt reports difficulty with using left hand during ADLs, currently in a splint and cannot lift or move objects. Pt cannot play the guitar right now. Pt cannot make a full grasp, has difficulty with incorporating LUE into grooming, dressing, bathing, etc.    FUNCTIONAL OUTCOME MEASURES: Quick Dash: 72.73  UPPER EXTREMITY ROM:      Active ROM Left eval  Thumb MCP (0-60) 42  Thumb IP (0-80) 62  Thumb Opposition to Small Finger -2.5cm   Index MCP (0-90) 42   Index PIP (0-100)  88  Index DIP (0-70)  30  Long MCP (0-90)  42  Long PIP (0-100)  80  Long DIP (0-70)  26  Ring MCP (0-90)  30  Ring PIP (0-100)  36  Ring DIP (0-70)  18  Little MCP (0-90)  20  Little PIP (0-100)  40  Little DIP (0-70)  12  (Blank rows = not tested)  HAND FUNCTION: TBD  COORDINATION: 9 Hole Peg test: Right: 20.42 sec; Left: 26.05 sec  SENSATION: Pt reports numbness in 4th and 5th digits from wrist to fingertips  EDEMA:   Left   Right Palm 21.5cm 20.25  MCPs 19.5cm 20.5cm   COGNITION: Overall cognitive status: Within functional limits for tasks assessed   TREATMENT DATE:                                                                                                                                 PATIENT EDUCATION: Education details: finger A/ROM Person educated: Patient Education method: Programmer, multimedia, Demonstration, and Handouts Education comprehension: verbalized understanding and returned demonstration  HOME EXERCISE PROGRAM: Eval: finger A/ROM  GOALS: Goals reviewed with patient? Yes  SHORT TERM GOALS: Target date: 08/18/23  Pt will be provided with and educated on HEP to improve mobility in the left hand required for use during ADLs as non-dominant.   Goal status: INITIAL  2.  Pt will reduce  edema in the left hand to minimal amounts to improve mobility required for achieving a full fist.   Goal status: INITIAL  3.  Pt will register grip strength  in the left hand at 10# and pinch strength at 4# or greater to improve ability to hold lightweight objects such as a toothbrush or hairbrush during grooming tasks.   Goal status: INITIAL    LONG TERM GOALS: Target date: 09/18/23  Pt will decrease pain in the left hand to 3/10 or less to improve ability to sleep for 2+ consecutive hours without waking due to pain.   Goal status: INITIAL  2.  Pt will increase A/ROM of the left digits by 10+ degrees to improve ability to make a fist required for grasping objects during ADLs.   Goal status: INITIAL  3.  Pt will increase grip strength to 30# and pinch strength to 8# or greater to improve ability to grasp and maintain hold on a guitar.   Goal status: INITIAL  4.  Pt will demonstrate ability to make 75% or greater fist to improve ability to use left hand as non-dominant during ADLs and household tasks.   Goal status: INITIAL     ASSESSMENT:  CLINICAL IMPRESSION: Patient is a 53 y.o. female who was seen today for occupational therapy evaluation s/p left fifth metacarpal fracture and I&D, external fixation on 04/12/23. Pt with complications upon pin removal, required additional 4 weeks in a cast.  Pt presents with edema and poor functional use of the left hand. Pt provided with XS edema glove for the left hand today. Pt demonstrates good form and carryover with HEP.    PERFORMANCE DEFICITS: in functional skills including ADLs, IADLs, coordination, dexterity, sensation, edema, ROM, strength, pain, fascial restrictions, and UE functional use  IMPAIRMENTS: are limiting patient from ADLs, IADLs, rest and sleep, work, and leisure.   COMORBIDITIES: has no other co-morbidities that affects occupational performance. Patient will benefit from skilled OT to address above impairments and  improve overall function.  MODIFICATION OR ASSISTANCE TO COMPLETE EVALUATION: No modification of tasks or assist necessary to complete an evaluation.  OT OCCUPATIONAL PROFILE AND HISTORY: Problem focused assessment: Including review of records relating to presenting problem.  CLINICAL DECISION MAKING: LOW - limited treatment options, no task modification necessary  REHAB POTENTIAL: Good  EVALUATION COMPLEXITY: Low      PLAN:  OT FREQUENCY: 2x/week  OT DURATION: 8 weeks  PLANNED INTERVENTIONS: 97168 OT Re-evaluation, 97535 self care/ADL training, 16109 therapeutic exercise, 97530 therapeutic activity, 97112 neuromuscular re-education, 97140 manual therapy, 97035 ultrasound, 97014 electrical stimulation unattended, patient/family education, and DME and/or AE instructions  RECOMMENDED OTHER SERVICES: None at this time  CONSULTED AND AGREED WITH PLAN OF CARE: Patient  PLAN FOR NEXT SESSION: Follow up on HEP, edema management techniques, A/ROM, grasp development    Lafonda Piety, OTR/L  [phone] 07/20/2023, 3:04 PM

## 2023-07-26 ENCOUNTER — Ambulatory Visit: Payer: Self-pay | Admitting: Physician Assistant

## 2023-08-04 ENCOUNTER — Encounter (HOSPITAL_COMMUNITY): Payer: Self-pay | Admitting: Occupational Therapy

## 2023-08-04 ENCOUNTER — Ambulatory Visit (HOSPITAL_COMMUNITY): Attending: Occupational Therapy | Admitting: Occupational Therapy

## 2023-08-04 DIAGNOSIS — M25642 Stiffness of left hand, not elsewhere classified: Secondary | ICD-10-CM | POA: Insufficient documentation

## 2023-08-04 DIAGNOSIS — R29898 Other symptoms and signs involving the musculoskeletal system: Secondary | ICD-10-CM | POA: Insufficient documentation

## 2023-08-04 DIAGNOSIS — R600 Localized edema: Secondary | ICD-10-CM | POA: Diagnosis present

## 2023-08-04 DIAGNOSIS — M79642 Pain in left hand: Secondary | ICD-10-CM | POA: Insufficient documentation

## 2023-08-04 NOTE — Therapy (Signed)
 OUTPATIENT OCCUPATIONAL THERAPY ORTHO TREATMENT  Patient Name: Sandra Parsons MRN: [id number] DOB:04-Jan-1971, 53 y.o., female Today's Date: 08/04/2023    END OF SESSION:  OT End of Session - 08/04/23 1428     Visit Number 2    Number of Visits 16    Date for OT Re-Evaluation 09/18/23   progress note 08/18/23   Authorization Type [employer] Medicaid    Authorization Time Period 30 visit limit combined PT/OT/ST    Authorization - Visit Number 1    Authorization - Number of Visits 30    OT Start Time 1401    OT Stop Time 1440    OT Time Calculation (min) 39 min    Activity Tolerance Patient tolerated treatment well    Behavior During Therapy WFL for tasks assessed/performed              Past Medical History:  Diagnosis Date   CKD (chronic kidney disease)    Complication of anesthesia    Depression    Diabetes mellitus without complication (HCC)    Hepatitis B    per patient- diagnosed at age 54   Hepatitis C    Hyperlipidemia    Hypertension    Not currently on BP meds   Pinched nerve    PONV (postoperative nausea and vomiting)    Thyroid  disease    Past Surgical History:  Procedure Laterality Date   BACK SURGERY     x2   CARPAL TUNNEL RELEASE Right 12/08/2020   Procedure: CARPAL TUNNEL RELEASE;  Surgeon: Tonita Frater, MD;  Location: [facility];  Service: Orthopedics;  Laterality: Right;   CYSTECTOMY     L forearm   OPEN REDUCTION INTERNAL FIXATION (ORIF) METACARPAL Left 04/12/2023   Procedure: OPEN REDUCTION INTERNAL FIXATION (ORIF) METACARPAL;  Surgeon: Ltanya Rummer, MD;  Location: [facility];  Service: Orthopedics;  Laterality: Left;  block and mac   TRIGGER FINGER RELEASE Left 09/01/2020   Procedure: LEFT LONG FINGER  A-1 PULLEY RELEASE;  Surgeon: Tonita Frater, MD;  Location: [facility];  Service: Orthopedics;  Laterality: Left;  Left long finger trigger release   TRIGGER FINGER RELEASE Right 12/08/2020   Procedure: RELEASE TRIGGER FINGER/A-1 PULLEY;   Surgeon: Tonita Frater, MD;  Location: [facility];  Service: Orthopedics;  Laterality: Right;  Right index and long finger trigger release   Patient Active Problem List   Diagnosis Date Noted   CKD (chronic kidney disease) stage 3, GFR 30-59 ml/min (HCC) 04/26/2022   Hypothyroidism 04/26/2022   Positive occult stool blood test 06/03/2021   Anemia 06/03/2021   Diabetic retinopathy (HCC) 04/28/2020   Positive hepatitis C antibody test 12/03/2019   Non-intractable vomiting 12/03/2019   PCP: None REFERRING PROVIDER: Dr. Ltanya Rummer  ONSET DATE: 04/12/23  REFERRING DIAG: G29.528U (ICD-10-CM) - Displaced fracture of neck of fifth metacarpal bone, left hand, initial encounter for open fracture  THERAPY DIAG:  Pain in left hand  Stiffness of left hand, not elsewhere classified  Other symptoms and signs involving the musculoskeletal system  Localized edema  Rationale for Evaluation and Treatment: Rehabilitation  SUBJECTIVE:   SUBJECTIVE STATEMENT: S: "The middle finger is the only one that won't come up off the table."   PERTINENT HISTORY: Pt is a 53 y/o female who sustained a dog bite on 04/09/23 and underwent fifth metacarpal ORIF on 04/12/23. Due to nature of injury, dog bite, unable to fixate with plates internally. External pins were removed, however bone shifted therefore pt  was placed back in a cast for an additional 4 weeks. Pt's bone now showing early healing.   Left fifth metacarpal neck open fracture I&D Left fifth metacarpal neck open fracture ORIF Left hand adjacent tissue transfer <10 sq cm for primary closure of soft tissue defect due to dog bite 4 view radiographs of the left hand with intraoperative interpreted Eischen.  PRECAUTIONS: Other: [place]  Handbook   WEIGHT BEARING RESTRICTIONS: Yes NWB  PAIN:  Are you having pain? No  FALLS: Has patient fallen in last 6 months? No  PLOF: Independent  PATIENT GOALS: To be able to use her left hand.   NEXT MD VISIT:  May 29th  OBJECTIVE:  Note: Objective measures were completed at Evaluation unless otherwise noted.  HAND DOMINANCE: Right  ADLs: Pt reports difficulty with using left hand during ADLs, currently in a splint and cannot lift or move objects. Pt cannot play the guitar right now. Pt cannot make a full grasp, has difficulty with incorporating LUE into grooming, dressing, bathing, etc.    FUNCTIONAL OUTCOME MEASURES: Quick Dash: 72.73  UPPER EXTREMITY ROM:      Active ROM Left eval  Thumb MCP (0-60) 42  Thumb IP (0-80) 62  Thumb Opposition to Small Finger -2.5cm   Index MCP (0-90) 42   Index PIP (0-100)  88  Index DIP (0-70)  30  Long MCP (0-90)  42  Long PIP (0-100)  80  Long DIP (0-70)  26  Ring MCP (0-90)  30  Ring PIP (0-100)  36  Ring DIP (0-70)  18  Little MCP (0-90)  20  Little PIP (0-100)  40  Little DIP (0-70)  12  (Blank rows = not tested)  HAND FUNCTION: Grip strength: Right: 66 lbs; Left: 16 lbs, Lateral pinch: Right: 14 lbs, Left: 14 lbs, and 3 point pinch: Right: 12 lbs, Left: 8 lbs  COORDINATION: 9 Hole Peg test: Right: 20.42 sec; Left: 26.05 sec  SENSATION: Pt reports numbness in 4th and 5th digits from wrist to fingertips  EDEMA:   Left   Right Palm 21.5cm 20.25  MCPs 19.5cm 20.5cm   TREATMENT DATE:                                                                                                                            08/04/23 -Retrograde massage and manual techniques to decrease edema in left digits and dorsal hand and promote improved mobility -Gentle passive stretching to left digits for flexion, 5 reps with 5" holds -A/ROM: MCP flexion, composite digit flexion/extension, 10 reps -Theraputty: yellow-flatten, using pvc pipe to cut circles into putty, rolling, gripping-pronated and supinated, pinching-3 point -Sponges: 23, 23, 23    PATIENT EDUCATION: Education details: flexion glove; theraputty grip and pinch-yellow Person educated:  Patient Education method: Explanation, Demonstration, and Handouts Education comprehension: verbalized understanding and returned demonstration  HOME EXERCISE PROGRAM: Eval: finger A/ROM 08/04/23: flexion glove wear; theraputty grip and pinch-yellow  GOALS: Goals reviewed  with patient? Yes  SHORT TERM GOALS: Target date: 08/18/23  Pt will be provided with and educated on HEP to improve mobility in the left hand required for use during ADLs as non-dominant.   Goal status:  IN PROGRESS  2.  Pt will reduce edema in the left hand to minimal amounts to improve mobility required for achieving a full fist.   Goal status:  IN PROGRESS  3.  Pt will register grip strength in the left hand at 10# and pinch strength at 4# or greater to improve ability to hold lightweight objects such as a toothbrush or hairbrush during grooming tasks.   Goal status:  IN PROGRESS    LONG TERM GOALS: Target date: 09/18/23  Pt will decrease pain in the left hand to 3/10 or less to improve ability to sleep for 2+ consecutive hours without waking due to pain.   Goal status:  IN PROGRESS  2.  Pt will increase A/ROM of the left digits by 10+ degrees to improve ability to make a fist required for grasping objects during ADLs.   Goal status:  IN PROGRESS  3.  Pt will increase grip strength to 30# and pinch strength to 8# or greater to improve ability to grasp and maintain hold on a guitar.   Goal status:  IN PROGRESS  4.  Pt will demonstrate ability to make 75% or greater fist to improve ability to use left hand as non-dominant during ADLs and household tasks.   Goal status:  IN PROGRESS     ASSESSMENT:  CLINICAL IMPRESSION: Pt reports she has been completing her exercises and has been wearing her edema glove. Pt has improved ROM and less edema compared to evaluation. Initiated retrograde massage and gentle passive stretching to the digits, pt noted to have moderate extensor tightness limiting fist.  Initiated grip strengthening with yellow theraputty and added to HEP. Grasp development tasks completed for functional use. Provided pt with flexion glove and adjusted for appropriate fit. Verbal cuing for form and technique during session.    PERFORMANCE DEFICITS: in functional skills including ADLs, IADLs, coordination, dexterity, sensation, edema, ROM, strength, pain, fascial restrictions, and UE functional use      PLAN:  OT FREQUENCY: 2x/week  OT DURATION: 8 weeks  PLANNED INTERVENTIONS: 97168 OT Re-evaluation, 97535 self care/ADL training, 16109 therapeutic exercise, 97530 therapeutic activity, 97112 neuromuscular re-education, 97140 manual therapy, 97035 ultrasound, 97014 electrical stimulation unattended, patient/family education, and DME and/or AE instructions  CONSULTED AND AGREED WITH PLAN OF CARE: Patient  PLAN FOR NEXT SESSION: Follow up on HEP, edema management techniques, A/ROM, grasp development    Lafonda Piety, OTR/L  [phone] 08/04/2023, 2:43 PM

## 2023-08-04 NOTE — Patient Instructions (Signed)

## 2023-08-05 ENCOUNTER — Ambulatory Visit (HOSPITAL_COMMUNITY): Admitting: Occupational Therapy

## 2023-08-05 ENCOUNTER — Encounter (HOSPITAL_COMMUNITY): Payer: Self-pay | Admitting: Occupational Therapy

## 2023-08-05 DIAGNOSIS — R600 Localized edema: Secondary | ICD-10-CM

## 2023-08-05 DIAGNOSIS — M79642 Pain in left hand: Secondary | ICD-10-CM

## 2023-08-05 DIAGNOSIS — M25642 Stiffness of left hand, not elsewhere classified: Secondary | ICD-10-CM

## 2023-08-05 DIAGNOSIS — R29898 Other symptoms and signs involving the musculoskeletal system: Secondary | ICD-10-CM

## 2023-08-05 NOTE — Therapy (Signed)
 OUTPATIENT OCCUPATIONAL THERAPY ORTHO TREATMENT  Patient Name: Sandra Parsons MRN: [id number] DOB:05-May-1970, 53 y.o., female Today's Date: 08/05/2023    END OF SESSION:  OT End of Session - 08/05/23 1420     Visit Number 3    Number of Visits 16    Date for OT Re-Evaluation 09/18/23   progress note 08/18/23   Authorization Type [employer]    Authorization Time Period 30 visit limit combined PT/OT/ST    Authorization - Visit Number 2    Authorization - Number of Visits 30    OT Start Time 1403   pt arrived late   OT Stop Time 1426    OT Time Calculation (min) 23 min    Activity Tolerance Patient tolerated treatment well    Behavior During Therapy WFL for tasks assessed/performed               Past Medical History:  Diagnosis Date   CKD (chronic kidney disease)    Complication of anesthesia    Depression    Diabetes mellitus without complication (HCC)    Hepatitis B    per patient- diagnosed at age 42   Hepatitis C    Hyperlipidemia    Hypertension    Not currently on BP meds   Pinched nerve    PONV (postoperative nausea and vomiting)    Thyroid  disease    Past Surgical History:  Procedure Laterality Date   BACK SURGERY     x2   CARPAL TUNNEL RELEASE Right 12/08/2020   Procedure: CARPAL TUNNEL RELEASE;  Surgeon: Tonita Frater, MD;  Location: [facility];  Service: Orthopedics;  Laterality: Right;   CYSTECTOMY     L forearm   OPEN REDUCTION INTERNAL FIXATION (ORIF) METACARPAL Left 04/12/2023   Procedure: OPEN REDUCTION INTERNAL FIXATION (ORIF) METACARPAL;  Surgeon: Ltanya Rummer, MD;  Location: [facility];  Service: Orthopedics;  Laterality: Left;  block and mac   TRIGGER FINGER RELEASE Left 09/01/2020   Procedure: LEFT LONG FINGER  A-1 PULLEY RELEASE;  Surgeon: Tonita Frater, MD;  Location: [facility];  Service: Orthopedics;  Laterality: Left;  Left long finger trigger release   TRIGGER FINGER RELEASE Right 12/08/2020   Procedure: RELEASE TRIGGER  FINGER/A-1 PULLEY;  Surgeon: Tonita Frater, MD;  Location: [facility];  Service: Orthopedics;  Laterality: Right;  Right index and long finger trigger release   Patient Active Problem List   Diagnosis Date Noted   CKD (chronic kidney disease) stage 3, GFR 30-59 ml/min (HCC) 04/26/2022   Hypothyroidism 04/26/2022   Positive occult stool blood test 06/03/2021   Anemia 06/03/2021   Diabetic retinopathy (HCC) 04/28/2020   Positive hepatitis C antibody test 12/03/2019   Non-intractable vomiting 12/03/2019   PCP: None REFERRING PROVIDER: Dr. Ltanya Rummer  ONSET DATE: 04/12/23  REFERRING DIAG: M57.846N (ICD-10-CM) - Displaced fracture of neck of fifth metacarpal bone, left hand, initial encounter for open fracture  THERAPY DIAG:  Pain in left hand  Stiffness of left hand, not elsewhere classified  Other symptoms and signs involving the musculoskeletal system  Localized edema  Rationale for Evaluation and Treatment: Rehabilitation  SUBJECTIVE:   SUBJECTIVE STATEMENT: S: "It's hurting some today."    PERTINENT HISTORY: Pt is a 53 y/o female who sustained a dog bite on 04/09/23 and underwent fifth metacarpal ORIF on 04/12/23. Due to nature of injury, dog bite, unable to fixate with plates internally. External pins were removed, however bone shifted therefore pt was placed back in  a cast for an additional 4 weeks. Pt's bone now showing early healing.   Left fifth metacarpal neck open fracture I&D Left fifth metacarpal neck open fracture ORIF Left hand adjacent tissue transfer <10 sq cm for primary closure of soft tissue defect due to dog bite 4 view radiographs of the left hand with intraoperative interpreted Eischen.  PRECAUTIONS: Other: [place]  Handbook   WEIGHT BEARING RESTRICTIONS: Yes NWB  PAIN:  Are you having pain? Yes: NPRS scale: 5/10 Pain location: left hand Pain description: sore Aggravating factors: use, movement Relieving factors: rest  FALLS: Has patient fallen in  last 6 months? No  PLOF: Independent  PATIENT GOALS: To be able to use her left hand.   NEXT MD VISIT: May 29th  OBJECTIVE:  Note: Objective measures were completed at Evaluation unless otherwise noted.  HAND DOMINANCE: Right  ADLs: Pt reports difficulty with using left hand during ADLs, currently in a splint and cannot lift or move objects. Pt cannot play the guitar right now. Pt cannot make a full grasp, has difficulty with incorporating LUE into grooming, dressing, bathing, etc.    FUNCTIONAL OUTCOME MEASURES: Quick Dash: 72.73  UPPER EXTREMITY ROM:      Active ROM Left eval  Thumb MCP (0-60) 42  Thumb IP (0-80) 62  Thumb Opposition to Small Finger -2.5cm   Index MCP (0-90) 42   Index PIP (0-100)  88  Index DIP (0-70)  30  Long MCP (0-90)  42  Long PIP (0-100)  80  Long DIP (0-70)  26  Ring MCP (0-90)  30  Ring PIP (0-100)  36  Ring DIP (0-70)  18  Little MCP (0-90)  20  Little PIP (0-100)  40  Little DIP (0-70)  12  (Blank rows = not tested)  HAND FUNCTION: Grip strength: Right: 66 lbs; Left: 16 lbs, Lateral pinch: Right: 14 lbs, Left: 14 lbs, and 3 point pinch: Right: 12 lbs, Left: 8 lbs  COORDINATION: 9 Hole Peg test: Right: 20.42 sec; Left: 26.05 sec  SENSATION: Pt reports numbness in 4th and 5th digits from wrist to fingertips  EDEMA:   Left   Right Palm 21.5cm 20.25  MCPs 19.5cm 20.5cm   TREATMENT DATE:                                                                                                                            08/05/23 -A/ROM: MCP flexion, PIP flexion, composite digit flexion/extension, 10 reps; 2 sets of composite fist -Opposition to 5th digit then full extension, 10 reps -Sponges: 23, 23 -Pinch task: pt using red clothespin and 3 point pinch to grasp and stack 4 towers of 5 sponges -Theraputty: yellow-flatten, rolling, gripping-pronated and supinated, pinching-3 point   08/04/23 -Retrograde massage and manual techniques to  decrease edema in left digits and dorsal hand and promote improved mobility -Gentle passive stretching to left digits for flexion, 5 reps with 5" holds -A/ROM: MCP flexion, composite digit flexion/extension,  10 reps -Theraputty: yellow-flatten, using pvc pipe to cut circles into putty, rolling, gripping-pronated and supinated, pinching-3 point -Sponges: 23, 23, 23    PATIENT EDUCATION: Education details: reviewed HEP Person educated: Patient Education method: Programmer, multimedia, Demonstration, and Handouts Education comprehension: verbalized understanding and returned demonstration  HOME EXERCISE PROGRAM: Eval: finger A/ROM 08/04/23: flexion glove wear; theraputty grip and pinch-yellow  GOALS: Goals reviewed with patient? Yes  SHORT TERM GOALS: Target date: 08/18/23  Pt will be provided with and educated on HEP to improve mobility in the left hand required for use during ADLs as non-dominant.   Goal status:  IN PROGRESS  2.  Pt will reduce edema in the left hand to minimal amounts to improve mobility required for achieving a full fist.   Goal status:  IN PROGRESS  3.  Pt will register grip strength in the left hand at 10# and pinch strength at 4# or greater to improve ability to hold lightweight objects such as a toothbrush or hairbrush during grooming tasks.   Goal status:  IN PROGRESS    LONG TERM GOALS: Target date: 09/18/23  Pt will decrease pain in the left hand to 3/10 or less to improve ability to sleep for 2+ consecutive hours without waking due to pain.   Goal status:  IN PROGRESS  2.  Pt will increase A/ROM of the left digits by 10+ degrees to improve ability to make a fist required for grasping objects during ADLs.   Goal status:  IN PROGRESS  3.  Pt will increase grip strength to 30# and pinch strength to 8# or greater to improve ability to grasp and maintain hold on a guitar.   Goal status:  IN PROGRESS  4.  Pt will demonstrate ability to make 75% or greater fist  to improve ability to use left hand as non-dominant during ADLs and household tasks.   Goal status:  IN PROGRESS     ASSESSMENT:  CLINICAL IMPRESSION: Pt reports she was able to wear the flexion glove one time for about 10 minutes. Pt completing A/ROM today, did not complete any passive stretching as pt with soreness and mild edema at 5th MCP. Completed gentle grip activities working on grasp development and improved grip. Rest breaks provided as needed, verbal cuing for form and technique. At end of session pt with improved grasp, making approximately 75% of a full fist.     PERFORMANCE DEFICITS: in functional skills including ADLs, IADLs, coordination, dexterity, sensation, edema, ROM, strength, pain, fascial restrictions, and UE functional use      PLAN:  OT FREQUENCY: 2x/week  OT DURATION: 8 weeks  PLANNED INTERVENTIONS: 97168 OT Re-evaluation, 97535 self care/ADL training, 16109 therapeutic exercise, 97530 therapeutic activity, 97112 neuromuscular re-education, 97140 manual therapy, 97035 ultrasound, 97014 electrical stimulation unattended, patient/family education, and DME and/or AE instructions  CONSULTED AND AGREED WITH PLAN OF CARE: Patient  PLAN FOR NEXT SESSION: Follow up on HEP, edema management techniques, A/ROM, grasp development    Lafonda Piety, OTR/L  [phone] 08/05/2023, 2:26 PM

## 2023-08-11 ENCOUNTER — Ambulatory Visit (HOSPITAL_COMMUNITY): Admitting: Occupational Therapy

## 2023-08-11 ENCOUNTER — Encounter (HOSPITAL_COMMUNITY): Payer: Self-pay | Admitting: Occupational Therapy

## 2023-08-11 DIAGNOSIS — M25642 Stiffness of left hand, not elsewhere classified: Secondary | ICD-10-CM

## 2023-08-11 DIAGNOSIS — R600 Localized edema: Secondary | ICD-10-CM

## 2023-08-11 DIAGNOSIS — M79642 Pain in left hand: Secondary | ICD-10-CM

## 2023-08-11 DIAGNOSIS — R29898 Other symptoms and signs involving the musculoskeletal system: Secondary | ICD-10-CM

## 2023-08-11 NOTE — Therapy (Signed)
 OUTPATIENT OCCUPATIONAL THERAPY ORTHO TREATMENT  Patient Name: Sandra Parsons MRN: [id number] DOB:17-Aug-1970, 53 y.o., female Today's Date: 08/11/2023    END OF SESSION:  OT End of Session - 08/11/23 1439     Visit Number 4    Number of Visits 16    Date for OT Re-Evaluation 09/18/23   progress note 08/18/23   Authorization Type [employer] Medicaid    Authorization Time Period 30 visit limit combined PT/OT/ST    Authorization - Visit Number 3    Authorization - Number of Visits 30    OT Start Time 1401    OT Stop Time 1439    OT Time Calculation (min) 38 min    Activity Tolerance Patient tolerated treatment well    Behavior During Therapy WFL for tasks assessed/performed                Past Medical History:  Diagnosis Date   CKD (chronic kidney disease)    Complication of anesthesia    Depression    Diabetes mellitus without complication (HCC)    Hepatitis B    per patient- diagnosed at age 59   Hepatitis C    Hyperlipidemia    Hypertension    Not currently on BP meds   Pinched nerve    PONV (postoperative nausea and vomiting)    Thyroid  disease    Past Surgical History:  Procedure Laterality Date   BACK SURGERY     x2   CARPAL TUNNEL RELEASE Right 12/08/2020   Procedure: CARPAL TUNNEL RELEASE;  Surgeon: Tonita Frater, MD;  Location: [facility];  Service: Orthopedics;  Laterality: Right;   CYSTECTOMY     L forearm   OPEN REDUCTION INTERNAL FIXATION (ORIF) METACARPAL Left 04/12/2023   Procedure: OPEN REDUCTION INTERNAL FIXATION (ORIF) METACARPAL;  Surgeon: Ltanya Rummer, MD;  Location: [facility];  Service: Orthopedics;  Laterality: Left;  block and mac   TRIGGER FINGER RELEASE Left 09/01/2020   Procedure: LEFT LONG FINGER  A-1 PULLEY RELEASE;  Surgeon: Tonita Frater, MD;  Location: [facility];  Service: Orthopedics;  Laterality: Left;  Left long finger trigger release   TRIGGER FINGER RELEASE Right 12/08/2020   Procedure: RELEASE TRIGGER FINGER/A-1 PULLEY;   Surgeon: Tonita Frater, MD;  Location: [facility];  Service: Orthopedics;  Laterality: Right;  Right index and long finger trigger release   Patient Active Problem List   Diagnosis Date Noted   CKD (chronic kidney disease) stage 3, GFR 30-59 ml/min (HCC) 04/26/2022   Hypothyroidism 04/26/2022   Positive occult stool blood test 06/03/2021   Anemia 06/03/2021   Diabetic retinopathy (HCC) 04/28/2020   Positive hepatitis C antibody test 12/03/2019   Non-intractable vomiting 12/03/2019   PCP: None REFERRING PROVIDER: Dr. Ltanya Rummer  ONSET DATE: 04/12/23  REFERRING DIAG: W09.811B (ICD-10-CM) - Displaced fracture of neck of fifth metacarpal bone, left hand, initial encounter for open fracture  THERAPY DIAG:  Pain in left hand  Stiffness of left hand, not elsewhere classified  Other symptoms and signs involving the musculoskeletal system  Localized edema  Rationale for Evaluation and Treatment: Rehabilitation  SUBJECTIVE:   SUBJECTIVE STATEMENT: S: "The glove causes severe pain."   PERTINENT HISTORY: Pt is a 53 y/o female who sustained a dog bite on 04/09/23 and underwent fifth metacarpal ORIF on 04/12/23. Due to nature of injury, dog bite, unable to fixate with plates internally. External pins were removed, however bone shifted therefore pt was placed back in a cast for  an additional 4 weeks. Pt's bone now showing early healing.   Left fifth metacarpal neck open fracture I&D Left fifth metacarpal neck open fracture ORIF Left hand adjacent tissue transfer <10 sq cm for primary closure of soft tissue defect due to dog bite 4 view radiographs of the left hand with intraoperative interpreted Eischen.  PRECAUTIONS: Other: [place]  Handbook   WEIGHT BEARING RESTRICTIONS: Yes NWB  PAIN:  Are you having pain? Yes: NPRS scale: 6/10 Pain location: left hand Pain description: sore Aggravating factors: use, movement Relieving factors: rest  FALLS: Has patient fallen in last 6 months?  No  PLOF: Independent  PATIENT GOALS: To be able to use her left hand.   NEXT MD VISIT: May 29th  OBJECTIVE:  Note: Objective measures were completed at Evaluation unless otherwise noted.  HAND DOMINANCE: Right  ADLs: Pt reports difficulty with using left hand during ADLs, currently in a splint and cannot lift or move objects. Pt cannot play the guitar right now. Pt cannot make a full grasp, has difficulty with incorporating LUE into grooming, dressing, bathing, etc.    FUNCTIONAL OUTCOME MEASURES: Quick Dash: 72.73  UPPER EXTREMITY ROM:      Active ROM Left eval  Thumb MCP (0-60) 42  Thumb IP (0-80) 62  Thumb Opposition to Small Finger -2.5cm   Index MCP (0-90) 42   Index PIP (0-100)  88  Index DIP (0-70)  30  Long MCP (0-90)  42  Long PIP (0-100)  80  Long DIP (0-70)  26  Ring MCP (0-90)  30  Ring PIP (0-100)  36  Ring DIP (0-70)  18  Little MCP (0-90)  20  Little PIP (0-100)  40  Little DIP (0-70)  12  (Blank rows = not tested)  HAND FUNCTION: Grip strength: Right: 66 lbs; Left: 16 lbs, Lateral pinch: Right: 14 lbs, Left: 14 lbs, and 3 point pinch: Right: 12 lbs, Left: 8 lbs  COORDINATION: 9 Hole Peg test: Right: 20.42 sec; Left: 26.05 sec  SENSATION: Pt reports numbness in 4th and 5th digits from wrist to fingertips  EDEMA:   Left   Right Palm 21.5cm 20.25  MCPs 19.5cm 20.5cm   TREATMENT DATE:                                                                                                                            08/11/23 -Retrograde massage and manual techniques to decrease edema in left digits and dorsal hand and promote improved mobility -Gentle passive stretching to left digits for flexion, 5 reps with 5" holds -A/ROM: MCP flexion, PIP flexion, composite digit flexion/extension, 10 reps -Sponges: 23, 23 -Coin manipulation: pt holding sequence chips in left palm, working on translating to fingertips to place on tabletop, maintaining hold on  remaining chips. Mod difficulty initially, dropping coins on initial trials. Completed 5 rounds -Grip strengthening: large and medium beads with gripper at 25#, small beads at 20#, vertical position -Pinch task: pt  using green clothespin and 3 point pinch to grasp and stack 4 towers of 5 sponges -Theraputty: red-flatten, rolling, gripping-pronated and supinated, pinching-3 point and lateral   08/05/23 -A/ROM: MCP flexion, PIP flexion, composite digit flexion/extension, 10 reps; 2 sets of composite fist -Opposition to 5th digit then full extension, 10 reps -Sponges: 23, 23 -Pinch task: pt using red clothespin and 3 point pinch to grasp and stack 4 towers of 5 sponges -Theraputty: yellow-flatten, rolling, gripping-pronated and supinated, pinching-3 point   08/04/23 -Retrograde massage and manual techniques to decrease edema in left digits and dorsal hand and promote improved mobility -Gentle passive stretching to left digits for flexion, 5 reps with 5" holds -A/ROM: MCP flexion, composite digit flexion/extension, 10 reps -Theraputty: yellow-flatten, using pvc pipe to cut circles into putty, rolling, gripping-pronated and supinated, pinching-3 point -Sponges: 23, 23, 23    PATIENT EDUCATION: Education details: reviewed HEP Person educated: Patient Education method: Programmer, multimedia, Demonstration, and Handouts Education comprehension: verbalized understanding and returned demonstration  HOME EXERCISE PROGRAM: Eval: finger A/ROM 08/04/23: flexion glove wear; theraputty grip and pinch-yellow  GOALS: Goals reviewed with patient? Yes  SHORT TERM GOALS: Target date: 08/18/23  Pt will be provided with and educated on HEP to improve mobility in the left hand required for use during ADLs as non-dominant.   Goal status:  IN PROGRESS  2.  Pt will reduce edema in the left hand to minimal amounts to improve mobility required for achieving a full fist.   Goal status:  IN PROGRESS  3.  Pt will  register grip strength in the left hand at 10# and pinch strength at 4# or greater to improve ability to hold lightweight objects such as a toothbrush or hairbrush during grooming tasks.   Goal status:  IN PROGRESS    LONG TERM GOALS: Target date: 09/18/23  Pt will decrease pain in the left hand to 3/10 or less to improve ability to sleep for 2+ consecutive hours without waking due to pain.   Goal status:  IN PROGRESS  2.  Pt will increase A/ROM of the left digits by 10+ degrees to improve ability to make a fist required for grasping objects during ADLs.   Goal status:  IN PROGRESS  3.  Pt will increase grip strength to 30# and pinch strength to 8# or greater to improve ability to grasp and maintain hold on a guitar.   Goal status:  IN PROGRESS  4.  Pt will demonstrate ability to make 75% or greater fist to improve ability to use left hand as non-dominant during ADLs and household tasks.   Goal status:  IN PROGRESS     ASSESSMENT:  CLINICAL IMPRESSION: Pt reports severe pain when wearing flexion glove, advised pt not to wear at this time. Pt with mild edema and stiffness today, manual techniques completed prior to exercises. Pt with 75% fist after passive stretching and digit A/ROM. Progressed grip strengthening using hand gripper and all bead sizes. Pt with mild fatigue, able to increase pinch resistance to using green clothespin today. At end of session pt with 80-85% fist. Verbal cuing for form and technique during session.    PERFORMANCE DEFICITS: in functional skills including ADLs, IADLs, coordination, dexterity, sensation, edema, ROM, strength, pain, fascial restrictions, and UE functional use      PLAN:  OT FREQUENCY: 2x/week  OT DURATION: 8 weeks  PLANNED INTERVENTIONS: 97168 OT Re-evaluation, 97535 self care/ADL training, 45409 therapeutic exercise, 97530 therapeutic activity, 97112 neuromuscular re-education, 97140 manual therapy, [id number]  ultrasound, 16109  electrical stimulation unattended, patient/family education, and DME and/or AE instructions  CONSULTED AND AGREED WITH PLAN OF CARE: Patient  PLAN FOR NEXT SESSION: Follow up on HEP, edema management techniques as needed, A/ROM, grasp development, hand strengthening    Lafonda Piety, OTR/L  [phone] 08/11/2023, 2:40 PM

## 2023-08-12 ENCOUNTER — Encounter (HOSPITAL_COMMUNITY): Payer: Self-pay | Admitting: Occupational Therapy

## 2023-08-12 ENCOUNTER — Ambulatory Visit (HOSPITAL_COMMUNITY): Admitting: Occupational Therapy

## 2023-08-12 DIAGNOSIS — M79642 Pain in left hand: Secondary | ICD-10-CM

## 2023-08-12 DIAGNOSIS — M25642 Stiffness of left hand, not elsewhere classified: Secondary | ICD-10-CM

## 2023-08-12 DIAGNOSIS — R600 Localized edema: Secondary | ICD-10-CM

## 2023-08-12 DIAGNOSIS — R29898 Other symptoms and signs involving the musculoskeletal system: Secondary | ICD-10-CM

## 2023-08-12 NOTE — Therapy (Signed)
 OUTPATIENT OCCUPATIONAL THERAPY ORTHO TREATMENT AND REASSESSMENT  Patient Name: Sandra Parsons MRN: [id number] DOB:1970/04/12, 53 y.o., female Today's Date: 08/12/2023    END OF SESSION:  OT End of Session - 08/12/23 1513     Visit Number 5    Number of Visits 16    Date for OT Re-Evaluation 09/18/23    Authorization Type [employer]    Authorization Time Period 30 visit limit combined PT/OT/ST    Authorization - Visit Number 4    Authorization - Number of Visits 30    OT Start Time 1435    OT Stop Time 1525    OT Time Calculation (min) 50 min    Activity Tolerance Patient tolerated treatment well    Behavior During Therapy WFL for tasks assessed/performed                 Past Medical History:  Diagnosis Date   CKD (chronic kidney disease)    Complication of anesthesia    Depression    Diabetes mellitus without complication (HCC)    Hepatitis B    per patient- diagnosed at age 66   Hepatitis C    Hyperlipidemia    Hypertension    Not currently on BP meds   Pinched nerve    PONV (postoperative nausea and vomiting)    Thyroid  disease    Past Surgical History:  Procedure Laterality Date   BACK SURGERY     x2   CARPAL TUNNEL RELEASE Right 12/08/2020   Procedure: CARPAL TUNNEL RELEASE;  Surgeon: Tonita Frater, MD;  Location: [facility];  Service: Orthopedics;  Laterality: Right;   CYSTECTOMY     L forearm   OPEN REDUCTION INTERNAL FIXATION (ORIF) METACARPAL Left 04/12/2023   Procedure: OPEN REDUCTION INTERNAL FIXATION (ORIF) METACARPAL;  Surgeon: Ltanya Rummer, MD;  Location: [facility];  Service: Orthopedics;  Laterality: Left;  block and mac   TRIGGER FINGER RELEASE Left 09/01/2020   Procedure: LEFT LONG FINGER  A-1 PULLEY RELEASE;  Surgeon: Tonita Frater, MD;  Location: [facility];  Service: Orthopedics;  Laterality: Left;  Left long finger trigger release   TRIGGER FINGER RELEASE Right 12/08/2020   Procedure: RELEASE TRIGGER FINGER/A-1 PULLEY;   Surgeon: Tonita Frater, MD;  Location: [facility];  Service: Orthopedics;  Laterality: Right;  Right index and long finger trigger release   Patient Active Problem List   Diagnosis Date Noted   CKD (chronic kidney disease) stage 3, GFR 30-59 ml/min (HCC) 04/26/2022   Hypothyroidism 04/26/2022   Positive occult stool blood test 06/03/2021   Anemia 06/03/2021   Diabetic retinopathy (HCC) 04/28/2020   Positive hepatitis C antibody test 12/03/2019   Non-intractable vomiting 12/03/2019   PCP: None REFERRING PROVIDER: Dr. Ltanya Rummer  ONSET DATE: 04/12/23  REFERRING DIAG: W09.811B (ICD-10-CM) - Displaced fracture of neck of fifth metacarpal bone, left hand, initial encounter for open fracture  THERAPY DIAG:  Pain in left hand  Stiffness of left hand, not elsewhere classified  Other symptoms and signs involving the musculoskeletal system  Localized edema  Rationale for Evaluation and Treatment: Rehabilitation  SUBJECTIVE:   SUBJECTIVE STATEMENT: S: "My hand was hurting from therapy yesterday"   PERTINENT HISTORY: Pt is a 53 y/o female who sustained a dog bite on 04/09/23 and underwent fifth metacarpal ORIF on 04/12/23. Due to nature of injury, dog bite, unable to fixate with plates internally. External pins were removed, however bone shifted therefore pt was placed back in a cast  for an additional 4 weeks. Pt's bone now showing early healing.   Left fifth metacarpal neck open fracture I&D Left fifth metacarpal neck open fracture ORIF Left hand adjacent tissue transfer <10 sq cm for primary closure of soft tissue defect due to dog bite 4 view radiographs of the left hand with intraoperative interpreted Eischen.  PRECAUTIONS: Other: [place]  Handbook   WEIGHT BEARING RESTRICTIONS: Yes NWB  PAIN:  Are you having pain? Yes: NPRS scale: 5 /10 Pain location: left hand Pain description: sore Aggravating factors: use, movement Relieving factors: rest  FALLS: Has patient fallen in  last 6 months? No  PLOF: Independent  PATIENT GOALS: To be able to use her left hand.   NEXT MD VISIT: May 29th  OBJECTIVE:  Note: Objective measures were completed at Evaluation unless otherwise noted.  HAND DOMINANCE: Right  ADLs: Pt reports difficulty with using left hand during ADLs, currently in a splint and cannot lift or move objects. Pt cannot play the guitar right now. Pt cannot make a full grasp, has difficulty with incorporating LUE into grooming, dressing, bathing, etc.    FUNCTIONAL OUTCOME MEASURES: Quick Dash: 72.73  UPPER EXTREMITY ROM:      Active ROM Left eval Left  5/23  Thumb MCP (0-60) 42 42  Thumb IP (0-80) 62 76  Thumb Opposition to Small Finger -2.5cm  touching  Index MCP (0-90) 42  60  Index PIP (0-100)  88 90  Index DIP (0-70)  30 40  Long MCP (0-90)  42 59  Long PIP (0-100)  80 81  Long DIP (0-70)  26 48  Ring MCP (0-90)  30 48  Ring PIP (0-100)  36 88  Ring DIP (0-70)  18 40  Little MCP (0-90)  20 40  Little PIP (0-100)  40 80  Little DIP (0-70)  12 54  (Blank rows = not tested)  HAND FUNCTION: Grip strength: Right: 66 lbs; Left: 16 lbs, Lateral pinch: Right: 14 lbs, Left: 14 lbs, and 3 point pinch: Right: 12 lbs, Left: 8 lbs Grip Strength: Right: 60 lbs, Left: 18, Lateral pinch: Right: 18 lbs, Left: 16 and 3 point pinch Right: 16 lbs  Left: 12 lbs  COORDINATION: 9 Hole Peg test: Right: 20.42 sec; Left: 26.05 sec  SENSATION: Pt reports numbness in 4th and 5th digits from wrist to fingertips  EDEMA:   Left   Right Palm 21.5cm 20.25  MCPs 19.5cm 20.5cm   TREATMENT DATE:                                                                                                                            08/12/23 -Retrograde massage and manual techniques to decrease edema in left digits and dorsal hand and promote improved mobility -Gentle passive stretching to left digits for flexion, 5 reps with 5" holds -A/ROM: MCP flexion, PIP flexion,  composite digit flexion/extension, 10 reps -Pinch task: pt using green clothespin and 3 point pinch to  grasp and place onto rod  -Theraputty: red-flatten and cookie cutter to address grip strength, 3 tip pinch across log   08/11/23 -Retrograde massage and manual techniques to decrease edema in left digits and dorsal hand and promote improved mobility -Gentle passive stretching to left digits for flexion, 5 reps with 5" holds -A/ROM: MCP flexion, PIP flexion, composite digit flexion/extension, 10 reps -Sponges: 23, 23 -Coin manipulation: pt holding sequence chips in left palm, working on translating to fingertips to place on tabletop, maintaining hold on remaining chips. Mod difficulty initially, dropping coins on initial trials. Completed 5 rounds -Grip strengthening: large and medium beads with gripper at 25#, small beads at 20#, vertical position -Pinch task: pt using green clothespin and 3 point pinch to grasp and stack 4 towers of 5 sponges -Theraputty: red-flatten, rolling, gripping-pronated and supinated, pinching-3 point and lateral   08/05/23 -A/ROM: MCP flexion, PIP flexion, composite digit flexion/extension, 10 reps; 2 sets of composite fist -Opposition to 5th digit then full extension, 10 reps -Sponges: 23, 23 -Pinch task: pt using red clothespin and 3 point pinch to grasp and stack 4 towers of 5 sponges -Theraputty: yellow-flatten, rolling, gripping-pronated and supinated, pinching-3 point    PATIENT EDUCATION: Education details: reviewed HEP Person educated: Patient Education method: Programmer, multimedia, Demonstration, and Handouts Education comprehension: verbalized understanding and returned demonstration  HOME EXERCISE PROGRAM: Eval: finger A/ROM 08/04/23: flexion glove wear; theraputty grip and pinch-yellow  GOALS: Goals reviewed with patient? Yes  SHORT TERM GOALS: Target date: 08/18/23  Pt will be provided with and educated on HEP to improve mobility in the left hand  required for use during ADLs as non-dominant.   Goal status:  IN PROGRESS  2.  Pt will reduce edema in the left hand to minimal amounts to improve mobility required for achieving a full fist.   Goal status:  IN PROGRESS  3.  Pt will register grip strength in the left hand at 10# and pinch strength at 4# or greater to improve ability to hold lightweight objects such as a toothbrush or hairbrush during grooming tasks.   Goal status:  IN PROGRESS    LONG TERM GOALS: Target date: 09/18/23  Pt will decrease pain in the left hand to 3/10 or less to improve ability to sleep for 2+ consecutive hours without waking due to pain.   Goal status:  IN PROGRESS  2.  Pt will increase A/ROM of the left digits by 10+ degrees to improve ability to make a fist required for grasping objects during ADLs.   Goal status:  IN PROGRESS  3.  Pt will increase grip strength to 30# and pinch strength to 8# or greater to improve ability to grasp and maintain hold on a guitar.   Goal status:  IN PROGRESS  4.  Pt will demonstrate ability to make 75% or greater fist to improve ability to use left hand as non-dominant during ADLs and household tasks.   Goal status:  IN PROGRESS     ASSESSMENT:  CLINICAL IMPRESSION: Pt stated that she has not been wearing the flexion glove since last appt. She stated that she was having pain from yesterdays appointment, she reports that she is sore within the forearm and shoulder as well- most likely not using these muscles for a period of time. Reassessment completed- noted improvements with ROM and strength with LUE. Continued with manual techniques to hand and forearm, noted fascial restrictions in forearm. Targeted grip and pinch strength this session with pinch clips and putty.  She stated that she will see the ortho MD next week to follow up.    PERFORMANCE DEFICITS: in functional skills including ADLs, IADLs, coordination, dexterity, sensation, edema, ROM, strength, pain,  fascial restrictions, and UE functional use      PLAN:  OT FREQUENCY: 2x/week  OT DURATION: 8 weeks  PLANNED INTERVENTIONS: 97168 OT Re-evaluation, 97535 self care/ADL training, 69629 therapeutic exercise, 97530 therapeutic activity, 97112 neuromuscular re-education, 97140 manual therapy, 97035 ultrasound, 97014 electrical stimulation unattended, patient/family education, and DME and/or AE instructions  CONSULTED AND AGREED WITH PLAN OF CARE: Patient  PLAN FOR NEXT SESSION: Follow up on HEP, edema management techniques as needed, A/ROM, grasp development, hand strengthening    Azell Boll, OTR/L  [phone] 08/12/2023, 3:33 PM

## 2023-08-17 ENCOUNTER — Encounter (HOSPITAL_COMMUNITY): Admitting: Occupational Therapy

## 2023-08-17 DIAGNOSIS — S62337B Displaced fracture of neck of fifth metacarpal bone, left hand, initial encounter for open fracture: Secondary | ICD-10-CM | POA: Diagnosis not present

## 2023-08-18 ENCOUNTER — Encounter: Payer: Self-pay | Admitting: Internal Medicine

## 2023-08-18 ENCOUNTER — Ambulatory Visit: Payer: Self-pay | Admitting: Internal Medicine

## 2023-08-18 VITALS — BP 135/77 | HR 83 | Ht 67.0 in | Wt 194.0 lb

## 2023-08-18 DIAGNOSIS — E1139 Type 2 diabetes mellitus with other diabetic ophthalmic complication: Secondary | ICD-10-CM | POA: Insufficient documentation

## 2023-08-18 DIAGNOSIS — Z1211 Encounter for screening for malignant neoplasm of colon: Secondary | ICD-10-CM

## 2023-08-18 DIAGNOSIS — E039 Hypothyroidism, unspecified: Secondary | ICD-10-CM | POA: Diagnosis not present

## 2023-08-18 DIAGNOSIS — R768 Other specified abnormal immunological findings in serum: Secondary | ICD-10-CM | POA: Diagnosis not present

## 2023-08-18 DIAGNOSIS — S62397D Other fracture of fifth metacarpal bone, left hand, subsequent encounter for fracture with routine healing: Secondary | ICD-10-CM | POA: Diagnosis not present

## 2023-08-18 DIAGNOSIS — E11319 Type 2 diabetes mellitus with unspecified diabetic retinopathy without macular edema: Secondary | ICD-10-CM | POA: Diagnosis not present

## 2023-08-18 DIAGNOSIS — Z794 Long term (current) use of insulin: Secondary | ICD-10-CM | POA: Diagnosis not present

## 2023-08-18 DIAGNOSIS — N1832 Chronic kidney disease, stage 3b: Secondary | ICD-10-CM

## 2023-08-18 DIAGNOSIS — I10 Essential (primary) hypertension: Secondary | ICD-10-CM | POA: Insufficient documentation

## 2023-08-18 DIAGNOSIS — H6123 Impacted cerumen, bilateral: Secondary | ICD-10-CM | POA: Diagnosis not present

## 2023-08-18 DIAGNOSIS — E782 Mixed hyperlipidemia: Secondary | ICD-10-CM | POA: Insufficient documentation

## 2023-08-18 DIAGNOSIS — S62307D Unspecified fracture of fifth metacarpal bone, left hand, subsequent encounter for fracture with routine healing: Secondary | ICD-10-CM | POA: Insufficient documentation

## 2023-08-18 MED ORDER — CARVEDILOL 6.25 MG PO TABS: 6.2500 mg | ORAL_TABLET | Freq: Two times a day (BID) | ORAL | 1 refills | Status: DC

## 2023-08-18 MED ORDER — METFORMIN HCL 500 MG PO TABS: 500.0000 mg | ORAL_TABLET | Freq: Two times a day (BID) | ORAL | 1 refills | Status: DC

## 2023-08-18 MED ORDER — AMLODIPINE BESYLATE 10 MG PO TABS: 10.0000 mg | ORAL_TABLET | Freq: Every day | ORAL | 3 refills | Status: AC

## 2023-08-18 MED ORDER — ATORVASTATIN CALCIUM 20 MG PO TABS: 20.0000 mg | ORAL_TABLET | Freq: Every day | ORAL | 1 refills | Status: DC

## 2023-08-18 MED ORDER — HYDRALAZINE HCL 25 MG PO TABS: 25.0000 mg | ORAL_TABLET | Freq: Two times a day (BID) | ORAL | 1 refills | Status: DC

## 2023-08-18 MED ORDER — LANTUS SOLOSTAR 100 UNIT/ML ~~LOC~~ SOPN: 28.0000 [IU] | PEN_INJECTOR | Freq: Every day | SUBCUTANEOUS | 2 refills | Status: DC

## 2023-08-18 MED ORDER — OZEMPIC (0.25 OR 0.5 MG/DOSE) 2 MG/3ML ~~LOC~~ SOPN: PEN_INJECTOR | SUBCUTANEOUS | 1 refills | Status: AC

## 2023-08-18 NOTE — Assessment & Plan Note (Signed)
 Followed by hand surgery Undergoing occupational therapy

## 2023-08-18 NOTE — Assessment & Plan Note (Signed)
 Has bilateral ear fullness and decreased hearing Bilateral ear irrigation done today

## 2023-08-18 NOTE — Assessment & Plan Note (Signed)
 Lab Results  Component Value Date   HGBA1C 7.9 (H) 04/08/2023   Uncontrolled due to diet noncompliance On metformin  500 mg twice daily and Lantus  28 units nightly Added Ozempic  - plan to increase dose as tolerated Advised to follow diabetic diet On statin F/u CMP and lipid panel Diabetic eye exam: Advised to follow up with Ophthalmology for diabetic eye exam, sees clinic in [place]

## 2023-08-18 NOTE — Assessment & Plan Note (Signed)
 Chart review suggests history of hep C antibody positive, HCVRNA was undetectable Also has history of hepatitis B, HBsAb positive now Was evaluated by GI in the past

## 2023-08-18 NOTE — Progress Notes (Signed)
 New Patient Office Visit  Subjective:  Patient ID: Sandra Parsons, female    DOB: [birth year]  Age: 53 y.o. MRN: [id number]  CC:  Chief Complaint  Patient presents with   Establish Care    Establish care reports pain on hand due to dog bite incident in January , would like to discuss diabetes management.     HPI Sandra Parsons is a 53 y.o. female with past medical history of HTN, type II DM, CKD, HLD, hypothyroidism and lumbar spondylosis who presents for establishing care.  HTN: BP is well-controlled. Takes medications regularly. Patient denies headache, dizziness, chest pain, dyspnea or palpitations.  Type II DM: She reports having hyperglycemic profile recently, blood glucose in 200s in the mornings recently.  She admits that she needs to improve her diet.  She has gained about 24 lbs in the last 6 months.  She is currently taking metformin  500 mg twice daily and Lantus  28 U at bedtime.  Denies polyuria or polyphagia currently.  Hypothyroidism: Her last TSH was elevated, after which her dose of levothyroxine  was increased to 100 mcg.  Her appetite and weight has been increasing in the last 6 months, but she attributes it to improvement in her dentition and noncompliance to low-carb diet.  HLD: She takes atorvastatin  20 mg QD.  CKD: Followed by nephrology.  She has a history of proteinuria as well.  Denies dysuria, hematuria, urinary hesitancy or resistance.  She had a dog bite in 01/25, which led to displaced fracture of left fifth metatarsal bone, which required ORIF.  Followed by hand surgery -Dr. Spears and is undergoing occupational therapy as well.  She still has pain of left hand, takes Aleve occasionally.  She reports bilateral ear fullness and hearing difficulty for the last 2 weeks.  Denies ear discharge, fever or chills.    Past Medical History:  Diagnosis Date   CKD (chronic kidney disease)    Complication of anesthesia    Depression    Diabetes mellitus without  complication (HCC)    Hepatitis B    per patient- diagnosed at age 39   Hepatitis C    Hyperlipidemia    Hypertension    Not currently on BP meds   Pinched nerve    PONV (postoperative nausea and vomiting)    Thyroid  disease     Past Surgical History:  Procedure Laterality Date   BACK SURGERY     x2   CARPAL TUNNEL RELEASE Right 12/08/2020   Procedure: CARPAL TUNNEL RELEASE;  Surgeon: Tonita Frater, MD;  Location: [facility];  Service: Orthopedics;  Laterality: Right;   CYSTECTOMY     L forearm   OPEN REDUCTION INTERNAL FIXATION (ORIF) METACARPAL Left 04/12/2023   Procedure: OPEN REDUCTION INTERNAL FIXATION (ORIF) METACARPAL;  Surgeon: Ltanya Rummer, MD;  Location: [facility];  Service: Orthopedics;  Laterality: Left;  block and mac   TRIGGER FINGER RELEASE Left 09/01/2020   Procedure: LEFT LONG FINGER  A-1 PULLEY RELEASE;  Surgeon: Tonita Frater, MD;  Location: [facility];  Service: Orthopedics;  Laterality: Left;  Left long finger trigger release   TRIGGER FINGER RELEASE Right 12/08/2020   Procedure: RELEASE TRIGGER FINGER/A-1 PULLEY;  Surgeon: Tonita Frater, MD;  Location: [facility];  Service: Orthopedics;  Laterality: Right;  Right index and long finger trigger release    Family History  Problem Relation Age of Onset   Cancer Mother        multiple myeloma   Diabetes  Mother    Heart failure Father    Diabetes Maternal Grandmother    Colon cancer Neg Hx     Social History   Socioeconomic History   Marital status: Single    Spouse name: Not on file   Number of children: Not on file   Years of education: Not on file   Highest education level: Not on file  Occupational History   Not on file  Tobacco Use   Smoking status: Former    Current packs/day: 0.00    Average packs/day: 0.3 packs/day for 31.0 years (7.8 ttl pk-yrs)    Types: Cigarettes    Start date: 06/21/1987    Quit date: 06/21/2018    Years since quitting: 5.1   Smokeless tobacco: Never  Vaping Use    Vaping status: Never Used  Substance and Sexual Activity   Alcohol use: No   Drug use: Yes    Types: Marijuana    Comment: once a day; history of methamphetamine and cocaine use with last use about 4 years ago-documented 12/03/2019   Sexual activity: Not on file  Other Topics Concern   Not on file  Social History Narrative   Not on file   Social Drivers of Health   Financial Resource Strain: Not on file  Food Insecurity: Not on file  Transportation Needs: Not on file  Physical Activity: Not on file  Stress: Not on file  Social Connections: Not on file  Intimate Partner Violence: Not on file    ROS Review of Systems  Constitutional:  Negative for chills and fever.  HENT:  Negative for congestion, sinus pressure, sinus pain and sore throat.        Ear fullness  Eyes:  Negative for pain and discharge.  Respiratory:  Negative for cough and shortness of breath.   Cardiovascular:  Negative for chest pain and palpitations.  Gastrointestinal:  Negative for abdominal pain, diarrhea, nausea and vomiting.  Endocrine: Negative for polydipsia and polyuria.  Genitourinary:  Negative for dysuria and hematuria.  Musculoskeletal:  Negative for neck pain and neck stiffness.       Left hand pain  Skin:  Negative for rash.  Neurological:  Negative for dizziness and weakness.  Psychiatric/Behavioral:  Negative for agitation and behavioral problems.     Objective:   Today's Vitals: BP 135/77   Pulse 83   Ht 5\' 7"  (1.702 m)   Wt 194 lb (88 kg)   LMP 04/26/2016   SpO2 96%   BMI 30.38 kg/m   Physical Exam Vitals reviewed.  Constitutional:      General: She is not in acute distress.    Appearance: She is obese. She is not diaphoretic.  HENT:     Head: Normocephalic and atraumatic.     Right Ear: There is impacted cerumen.     Left Ear: There is impacted cerumen.     Nose: Nose normal.     Mouth/Throat:     Mouth: Mucous membranes are moist.  Eyes:     General: No scleral  icterus.    Extraocular Movements: Extraocular movements intact.  Cardiovascular:     Rate and Rhythm: Normal rate and regular rhythm.     Heart sounds: Normal heart sounds. No murmur heard. Pulmonary:     Breath sounds: Normal breath sounds. No wheezing or rales.  Musculoskeletal:     Cervical back: Neck supple. No tenderness.     Right lower leg: No edema.     Left lower  leg: No edema.  Skin:    General: Skin is warm.     Findings: No rash.     Comments: Scar over left hand - well-healing  Neurological:     General: No focal deficit present.     Mental Status: She is alert and oriented to person, place, and time.  Psychiatric:        Mood and Affect: Mood normal.        Behavior: Behavior normal.     Assessment & Plan:   Problem List Items Addressed This Visit       Cardiovascular and Mediastinum   Essential hypertension - Primary   BP Readings from Last 1 Encounters:  08/18/23 135/77   Well-controlled with amlodipine  10 mg QD, Coreg  6.25 mg twice daily and hydralazine  25 mg twice daily Counseled for compliance with the medications Advised DASH diet and moderate exercise/walking, at least 150 mins/week       Relevant Medications   hydrALAZINE  (APRESOLINE ) 25 MG tablet   carvedilol  (COREG ) 6.25 MG tablet   atorvastatin  (LIPITOR) 20 MG tablet   amLODipine  (NORVASC ) 10 MG tablet     Endocrine   Hypothyroidism   Lab Results  Component Value Date   TSH 38.902 (H) 04/08/2023   Levothyroxine  dose was increased by previous PCP after checking TSH On levothyroxine  100 mcg QD currently Check TSH and free T4      Relevant Medications   carvedilol  (COREG ) 6.25 MG tablet   Other Relevant Orders   TSH + free T4   Type 2 diabetes mellitus with ophthalmic complication (HCC)   Lab Results  Component Value Date   HGBA1C 7.9 (H) 04/08/2023   Uncontrolled due to diet noncompliance On metformin  500 mg twice daily and Lantus  28 units nightly Added Ozempic - plan to  increase dose as tolerated Advised to follow diabetic diet On statin F/u CMP and lipid panel Diabetic eye exam: Advised to follow up with Ophthalmology for diabetic eye exam, sees clinic in [place]       Relevant Medications   Semaglutide,0.25 or 0.5MG /DOS, (OZEMPIC, 0.25 OR 0.5 MG/DOSE,) 2 MG/3ML SOPN   metFORMIN  (GLUCOPHAGE ) 500 MG tablet   atorvastatin  (LIPITOR) 20 MG tablet   insulin glargine  (LANTUS  SOLOSTAR) 100 UNIT/ML Solostar Pen   Other Relevant Orders   Microalbumin / creatinine urine ratio   CMP14+EGFR   Hemoglobin A1c     Nervous and Auditory   Bilateral impacted cerumen   Has bilateral ear fullness and decreased hearing Bilateral ear irrigation done today        Musculoskeletal and Integument   Closed displaced fracture of fifth metacarpal bone of left hand with routine healing   Followed by hand surgery Undergoing occupational therapy        Genitourinary   CKD (chronic kidney disease) stage 3, GFR 30-59 ml/min (HCC)   Last BMP reviewed, GFR stays around 45 Advised to maintain adequate hydration Avoid nephrotoxic agents Followed by nephrology-Dr. Carrolyn Clan      Relevant Orders   CMP14+EGFR   CBC with Differential/Platelet     Other   Positive hepatitis C antibody test   Chart review suggests history of hep C antibody positive, HCVRNA was undetectable Also has history of hepatitis B, HBsAb positive now Was evaluated by GI in the past      Mixed hyperlipidemia   On atorvastatin  20 mg QD      Relevant Medications   hydrALAZINE  (APRESOLINE ) 25 MG tablet   carvedilol  (COREG ) 6.25 MG  tablet   atorvastatin  (LIPITOR) 20 MG tablet   amLODipine  (NORVASC ) 10 MG tablet   Other Visit Diagnoses       Colon cancer screening       Relevant Orders   Ambulatory referral to Gastroenterology       Outpatient Encounter Medications as of 08/18/2023  Medication Sig   gabapentin  (NEURONTIN ) 100 MG capsule TAKE 1 CAPSULE BY MOUTH TWICE DAILY AS NEEDED    levothyroxine  (SYNTHROID ) 100 MCG tablet Take 1 tablet (100 mcg total) by mouth daily.   methocarbamol  (ROBAXIN ) 500 MG tablet Take 1 tablet (500 mg total) by mouth 2 (two) times daily.   ondansetron  (ZOFRAN -ODT) 4 MG disintegrating tablet Take 1 tablet (4 mg total) by mouth every 8 (eight) hours as needed for nausea or vomiting.   Semaglutide ,0.25 or 0.5MG /DOS, (OZEMPIC , 0.25 OR 0.5 MG/DOSE,) 2 MG/3ML SOPN Inject 0.25 mg into the skin every 7 (seven) days for 28 days, THEN 0.5 mg every 7 (seven) days.   triamcinolone  (KENALOG ) 0.025 % ointment Apply 1 application topically at bedtime as needed.   [DISCONTINUED] amLODipine  (NORVASC ) 10 MG tablet Take 1 tablet (10 mg total) by mouth daily.   [DISCONTINUED] atorvastatin  (LIPITOR) 20 MG tablet TAKE 1 Tablet BY MOUTH ONCE EVERY DAY   [DISCONTINUED] carvedilol  (COREG ) 6.25 MG tablet Take 1 tablet (6.25 mg total) by mouth 2 (two) times daily with a meal.   [DISCONTINUED] cyclobenzaprine  (FLEXERIL ) 10 MG tablet Take 1 tablet (10 mg total) by mouth 2 (two) times daily as needed for muscle spasms.   [DISCONTINUED] hydrALAZINE  (APRESOLINE ) 25 MG tablet Take 1 tablet (25 mg total) by mouth 2 (two) times daily.   [DISCONTINUED] insulin glargine  (LANTUS  SOLOSTAR) 100 UNIT/ML Solostar Pen Inject 28 Units into the skin daily.   [DISCONTINUED] metFORMIN  (GLUCOPHAGE ) 500 MG tablet Take 1 tablet (500 mg total) by mouth 2 (two) times daily with a meal.   [DISCONTINUED] sitaGLIPtin  (JANUVIA ) 50 MG tablet Take 1 tablet (50 mg total) by mouth daily.   amLODipine  (NORVASC ) 10 MG tablet Take 1 tablet (10 mg total) by mouth daily.   atorvastatin  (LIPITOR) 20 MG tablet Take 1 tablet (20 mg total) by mouth daily.   carvedilol  (COREG ) 6.25 MG tablet Take 1 tablet (6.25 mg total) by mouth 2 (two) times daily with a meal.   hydrALAZINE  (APRESOLINE ) 25 MG tablet Take 1 tablet (25 mg total) by mouth 2 (two) times daily.   insulin glargine  (LANTUS  SOLOSTAR) 100 UNIT/ML Solostar Pen  Inject 28 Units into the skin daily.   metFORMIN  (GLUCOPHAGE ) 500 MG tablet Take 1 tablet (500 mg total) by mouth 2 (two) times daily with a meal.   No facility-administered encounter medications on file as of 08/18/2023.    Follow-up: Return in about 2 months (around 10/18/2023) for DM and HTN.   Meldon Sport, MD

## 2023-08-18 NOTE — Assessment & Plan Note (Signed)
 Last BMP reviewed, GFR stays around 45 Advised to maintain adequate hydration Avoid nephrotoxic agents Followed by nephrology-Dr. Carrolyn Clan

## 2023-08-18 NOTE — Assessment & Plan Note (Signed)
 Lab Results  Component Value Date   TSH 38.902 (H) 04/08/2023   Levothyroxine  dose was increased by previous PCP after checking TSH On levothyroxine  100 mcg QD currently Check TSH and free T4

## 2023-08-18 NOTE — Assessment & Plan Note (Signed)
 BP Readings from Last 1 Encounters:  08/18/23 135/77   Well-controlled with amlodipine  10 mg QD, Coreg  6.25 mg twice daily and hydralazine  25 mg twice daily Counseled for compliance with the medications Advised DASH diet and moderate exercise/walking, at least 150 mins/week

## 2023-08-18 NOTE — Assessment & Plan Note (Signed)
-   On atorvastatin 20mg  QD

## 2023-08-18 NOTE — Patient Instructions (Addendum)
 Please start taking Ozempic  0.25 mg once weekly for 4 weeks and then 0.50 mg once weekly.  Please continue to take medications as prescribed.  Please continue to follow low carb diet and perform moderate exercise/walking at least 150 mins/week.

## 2023-08-19 ENCOUNTER — Other Ambulatory Visit (HOSPITAL_COMMUNITY): Payer: Self-pay

## 2023-08-19 ENCOUNTER — Telehealth: Payer: Self-pay | Admitting: Pharmacy Technician

## 2023-08-19 ENCOUNTER — Ambulatory Visit: Payer: Self-pay | Admitting: Internal Medicine

## 2023-08-19 LAB — CBC WITH DIFFERENTIAL/PLATELET
Basophils Absolute: 0.1 10*3/uL (ref 0.0–0.2)
Basos: 2 %
EOS (ABSOLUTE): 0.8 10*3/uL — ABNORMAL HIGH (ref 0.0–0.4)
Eos: 12 %
Hematocrit: 36.4 % (ref 34.0–46.6)
Hemoglobin: 11.9 g/dL (ref 11.1–15.9)
Immature Grans (Abs): 0 10*3/uL (ref 0.0–0.1)
Immature Granulocytes: 0 %
Lymphocytes Absolute: 1.8 10*3/uL (ref 0.7–3.1)
Lymphs: 25 %
MCH: 30.9 pg (ref 26.6–33.0)
MCHC: 32.7 g/dL (ref 31.5–35.7)
MCV: 95 fL (ref 79–97)
Monocytes Absolute: 0.8 10*3/uL (ref 0.1–0.9)
Monocytes: 11 %
Neutrophils Absolute: 3.6 10*3/uL (ref 1.4–7.0)
Neutrophils: 50 %
Platelets: 258 10*3/uL (ref 150–450)
RBC: 3.85 x10E6/uL (ref 3.77–5.28)
RDW: 13.4 % (ref 11.7–15.4)
WBC: 7 10*3/uL (ref 3.4–10.8)

## 2023-08-19 LAB — CMP14+EGFR
ALT: 23 IU/L (ref 0–32)
AST: 20 IU/L (ref 0–40)
Albumin: 4.2 g/dL (ref 3.8–4.9)
Alkaline Phosphatase: 100 IU/L (ref 44–121)
BUN/Creatinine Ratio: 20 (ref 9–23)
BUN: 24 mg/dL (ref 6–24)
Bilirubin Total: <0.2 mg/dL (ref 0.0–1.2)
CO2: 21 mmol/L (ref 20–29)
Calcium: 9.7 mg/dL (ref 8.7–10.2)
Chloride: 108 mmol/L — ABNORMAL HIGH (ref 96–106)
Creatinine, Ser: 1.18 mg/dL — ABNORMAL HIGH (ref 0.57–1.00)
Globulin, Total: 3 g/dL (ref 1.5–4.5)
Glucose: 210 mg/dL — ABNORMAL HIGH (ref 70–99)
Potassium: 4.4 mmol/L (ref 3.5–5.2)
Sodium: 142 mmol/L (ref 134–144)
Total Protein: 7.2 g/dL (ref 6.0–8.5)
eGFR: 56 mL/min/{1.73_m2} — ABNORMAL LOW (ref >59)

## 2023-08-19 LAB — TSH+FREE T4
Free T4: 1.1 ng/dL (ref 0.82–1.77)
TSH: 7.24 u[IU]/mL — ABNORMAL HIGH (ref 0.450–4.500)

## 2023-08-19 LAB — HEMOGLOBIN A1C
Est. average glucose Bld gHb Est-mCnc: 174 mg/dL
Hgb A1c MFr Bld: 7.7 % — ABNORMAL HIGH (ref 4.8–5.6)

## 2023-08-19 MED ORDER — LEVOTHYROXINE SODIUM 100 MCG PO TABS
100.0000 ug | ORAL_TABLET | Freq: Every day | ORAL | 1 refills | Status: AC
Start: 2023-08-19 — End: ?

## 2023-08-19 NOTE — Telephone Encounter (Signed)
 Pharmacy Patient Advocate Encounter   Received notification from CoverMyMeds that prior authorization for Ozempic (0.25 or 0.5 MG/DOSE) 2MG /3ML pen-injectors is required/requested.   Insurance verification completed.   The patient is insured through [facility] MEDICAID .   Per test claim: PA required; PA submitted to above mentioned insurance via CoverMyMeds Key/confirmation #/EOC [id number] Status is pending

## 2023-08-19 NOTE — Telephone Encounter (Signed)
 Pharmacy Patient Advocate Encounter  Received notification from [facility] MEDICAID that Prior Authorization for Ozempic (0.25 or 0.5 MG/DOSE) 2MG /3ML pen-injectors has been APPROVED from 08/19/2023 to 08/18/2024.   PA #/Case ID/Reference #:  [id number]

## 2023-08-19 NOTE — Addendum Note (Signed)
 Addended byCleola Dach on: 08/19/2023 07:04 AM   Modules accepted: Orders

## 2023-08-20 LAB — MICROALBUMIN / CREATININE URINE RATIO
Creatinine, Urine: 194.2 mg/dL
Microalb/Creat Ratio: 733 mg/g{creat} — ABNORMAL HIGH (ref 0–29)
Microalbumin, Urine: 1423 ug/mL

## 2023-08-22 ENCOUNTER — Encounter (INDEPENDENT_AMBULATORY_CARE_PROVIDER_SITE_OTHER): Payer: Self-pay | Admitting: *Deleted

## 2023-08-23 ENCOUNTER — Encounter (INDEPENDENT_AMBULATORY_CARE_PROVIDER_SITE_OTHER): Payer: Self-pay | Admitting: *Deleted

## 2023-08-23 ENCOUNTER — Encounter (HOSPITAL_COMMUNITY): Payer: Self-pay | Admitting: Occupational Therapy

## 2023-08-23 ENCOUNTER — Ambulatory Visit (HOSPITAL_COMMUNITY): Attending: Occupational Therapy | Admitting: Occupational Therapy

## 2023-08-23 DIAGNOSIS — R29898 Other symptoms and signs involving the musculoskeletal system: Secondary | ICD-10-CM | POA: Diagnosis present

## 2023-08-23 DIAGNOSIS — M25642 Stiffness of left hand, not elsewhere classified: Secondary | ICD-10-CM | POA: Diagnosis present

## 2023-08-23 DIAGNOSIS — M79642 Pain in left hand: Secondary | ICD-10-CM | POA: Insufficient documentation

## 2023-08-23 DIAGNOSIS — R600 Localized edema: Secondary | ICD-10-CM | POA: Insufficient documentation

## 2023-08-23 NOTE — Patient Instructions (Signed)
Tendon Gliding Exercises: Complete 10X, hold for 3-5 seconds, 1-2x per day  1) Straight: begin with wrist in extended position and fingers straight    2) Hook: Bend your fingers making them look like a hook while keeping your thumb straight.      3) Fist: Make your hand into a fist.      4) Straight Fist: Bend your fingers straight down into a straight fist.      5) Table Top: Straighten your fingers straight out making them look like a table top.       

## 2023-08-23 NOTE — Therapy (Signed)
 OUTPATIENT OCCUPATIONAL THERAPY ORTHO TREATMENT   Patient Name: Sandra Parsons MRN: [id number] DOB:12-Dec-1970, 53 y.o., female Today's Date: 08/23/2023    END OF SESSION:  OT End of Session - 08/23/23 1454     Visit Number 6    Number of Visits 16    Date for OT Re-Evaluation 09/18/23    Authorization Type UHC Medicaid    Authorization Time Period 30 visit limit combined PT/OT/ST    Authorization - Visit Number 5    Authorization - Number of Visits 30    OT Start Time 1435    OT Stop Time 1513    OT Time Calculation (min) 38 min    Activity Tolerance Patient tolerated treatment well    Behavior During Therapy WFL for tasks assessed/performed                  Past Medical History:  Diagnosis Date   CKD (chronic kidney disease)    Complication of anesthesia    Depression    Diabetes mellitus without complication (HCC)    Hepatitis B    per patient- diagnosed at age 51   Hepatitis C    Hyperlipidemia    Hypertension    Not currently on BP meds   Pinched nerve    PONV (postoperative nausea and vomiting)    Thyroid  disease    Past Surgical History:  Procedure Laterality Date   BACK SURGERY     x2   CARPAL TUNNEL RELEASE Right 12/08/2020   Procedure: CARPAL TUNNEL RELEASE;  Surgeon: Tonita Frater, MD;  Location: [facility];  Service: Orthopedics;  Laterality: Right;   CYSTECTOMY     L forearm   OPEN REDUCTION INTERNAL FIXATION (ORIF) METACARPAL Left 04/12/2023   Procedure: OPEN REDUCTION INTERNAL FIXATION (ORIF) METACARPAL;  Surgeon: Ltanya Rummer, MD;  Location: [facility];  Service: Orthopedics;  Laterality: Left;  block and mac   TRIGGER FINGER RELEASE Left 09/01/2020   Procedure: LEFT LONG FINGER  A-1 PULLEY RELEASE;  Surgeon: Tonita Frater, MD;  Location: [facility];  Service: Orthopedics;  Laterality: Left;  Left long finger trigger release   TRIGGER FINGER RELEASE Right 12/08/2020   Procedure: RELEASE TRIGGER FINGER/A-1 PULLEY;  Surgeon: Tonita Frater, MD;  Location: [facility];  Service: Orthopedics;  Laterality: Right;  Right index and long finger trigger release   Patient Active Problem List   Diagnosis Date Noted   Essential hypertension 08/18/2023   Type 2 diabetes mellitus with ophthalmic complication (HCC) 08/18/2023   Mixed hyperlipidemia 08/18/2023   Bilateral impacted cerumen 08/18/2023   Closed displaced fracture of fifth metacarpal bone of left hand with routine healing 08/18/2023   CKD (chronic kidney disease) stage 3, GFR 30-59 ml/min (HCC) 04/26/2022   Hypothyroidism 04/26/2022   Positive occult stool blood test 06/03/2021   Anemia 06/03/2021   Diabetic retinopathy (HCC) 04/28/2020   Positive hepatitis C antibody test 12/03/2019   PCP: None REFERRING PROVIDER: Dr. Ltanya Rummer  ONSET DATE: 04/12/23  REFERRING DIAG: N82.956O (ICD-10-CM) - Displaced fracture of neck of fifth metacarpal bone, left hand, initial encounter for open fracture  THERAPY DIAG:  Pain in left hand  Stiffness of left hand, not elsewhere classified  Other symptoms and signs involving the musculoskeletal system  Localized edema  Rationale for Evaluation and Treatment: Rehabilitation  SUBJECTIVE:   SUBJECTIVE STATEMENT: S: "The doctor said my hand still isn't healed up."   PERTINENT HISTORY: Pt is a 53 y/o female who sustained  a dog bite on 04/09/23 and underwent fifth metacarpal ORIF on 04/12/23. Due to nature of injury, dog bite, unable to fixate with plates internally. External pins were removed, however bone shifted therefore pt was placed back in a cast for an additional 4 weeks. Pt's bone now showing early healing.   Left fifth metacarpal neck open fracture I&D Left fifth metacarpal neck open fracture ORIF Left hand adjacent tissue transfer <10 sq cm for primary closure of soft tissue defect due to dog bite 4 view radiographs of the left hand with intraoperative interpreted Eischen.  PRECAUTIONS: Other: [place]   Handbook   WEIGHT BEARING RESTRICTIONS: Yes NWB  PAIN:  Are you having pain? Yes: NPRS scale: 5 /10 Pain location: left hand Pain description: sore Aggravating factors: use, movement Relieving factors: rest  FALLS: Has patient fallen in last 6 months? No  PLOF: Independent  PATIENT GOALS: To be able to use her left hand.   NEXT MD VISIT: July   OBJECTIVE:  Note: Objective measures were completed at Evaluation unless otherwise noted.  HAND DOMINANCE: Right  ADLs: Pt reports difficulty with using left hand during ADLs, currently in a splint and cannot lift or move objects. Pt cannot play the guitar right now. Pt cannot make a full grasp, has difficulty with incorporating LUE into grooming, dressing, bathing, etc.    FUNCTIONAL OUTCOME MEASURES: Quick Dash: 72.73  UPPER EXTREMITY ROM:      Active ROM Left eval Left  5/23  Thumb MCP (0-60) 42 42  Thumb IP (0-80) 62 76  Thumb Opposition to Small Finger -2.5cm  touching  Index MCP (0-90) 42  60  Index PIP (0-100)  88 90  Index DIP (0-70)  30 40  Long MCP (0-90)  42 59  Long PIP (0-100)  80 81  Long DIP (0-70)  26 48  Ring MCP (0-90)  30 48  Ring PIP (0-100)  36 88  Ring DIP (0-70)  18 40  Little MCP (0-90)  20 40  Little PIP (0-100)  40 80  Little DIP (0-70)  12 54  (Blank rows = not tested)  HAND FUNCTION: Grip strength: Right: 66 lbs; Left: 16 lbs, Lateral pinch: Right: 14 lbs, Left: 14 lbs, and 3 point pinch: Right: 12 lbs, Left: 8 lbs Grip Strength: Right: 60 lbs, Left: 18, Lateral pinch: Right: 18 lbs, Left: 16 and 3 point pinch Right: 16 lbs  Left: 12 lbs  COORDINATION: 9 Hole Peg test: Right: 20.42 sec; Left: 26.05 sec  SENSATION: Pt reports numbness in 4th and 5th digits from wrist to fingertips  EDEMA:   Left   Right Palm 21.5cm 20.25  MCPs 19.5cm 20.5cm   TREATMENT DATE:                                                                                                                             08/23/23 -Retrograde massage and manual techniques to decrease edema in left digits and  dorsal hand and promote improved mobility -A/ROM: MCP flexion, PIP flexion, composite digit flexion/extension, 10 reps -Tendon glides: 10 reps -Sponges: 23, 23 -Grip strengthening: large and medium beads with gripper at 25#, vertical gripper -Pinch task: pt using green clothespin and 3 point pinch to grasp and stack 4 towers of 5 sponges, removed and replaced in bucket with lateral pinch -Coin manipulation: pt holding coins in palm, working to translating to fingertips and drop into slotted container. Pt dropping coins intermittently during 3 rounds  08/12/23 -Retrograde massage and manual techniques to decrease edema in left digits and dorsal hand and promote improved mobility -Gentle passive stretching to left digits for flexion, 5 reps with 5" holds -A/ROM: MCP flexion, PIP flexion, composite digit flexion/extension, 10 reps -Pinch task: pt using green clothespin and 3 point pinch to grasp and place onto rod  -Theraputty: red-flatten and cookie cutter to address grip strength, 3 tip pinch across log   08/11/23 -Retrograde massage and manual techniques to decrease edema in left digits and dorsal hand and promote improved mobility -Gentle passive stretching to left digits for flexion, 5 reps with 5" holds -A/ROM: MCP flexion, PIP flexion, composite digit flexion/extension, 10 reps -Sponges: 23, 23 -Coin manipulation: pt holding sequence chips in left palm, working on translating to fingertips to place on tabletop, maintaining hold on remaining chips. Mod difficulty initially, dropping coins on initial trials. Completed 5 rounds -Grip strengthening: large and medium beads with gripper at 25#, small beads at 20#, vertical position -Pinch task: pt using green clothespin and 3 point pinch to grasp and stack 4 towers of 5 sponges -Theraputty: red-flatten, rolling, gripping-pronated and supinated, pinching-3  point and lateral     PATIENT EDUCATION: Education details: tendon glides Person educated: Patient Education method: Programmer, multimedia, Demonstration, and Handouts Education comprehension: verbalized understanding and returned demonstration  HOME EXERCISE PROGRAM: Eval: finger A/ROM 08/04/23: flexion glove wear; theraputty grip and pinch-yellow 08/23/23: tendon glides  GOALS: Goals reviewed with patient? Yes  SHORT TERM GOALS: Target date: 08/18/23  Pt will be provided with and educated on HEP to improve mobility in the left hand required for use during ADLs as non-dominant.   Goal status:  IN PROGRESS  2.  Pt will reduce edema in the left hand to minimal amounts to improve mobility required for achieving a full fist.   Goal status:  IN PROGRESS  3.  Pt will register grip strength in the left hand at 10# and pinch strength at 4# or greater to improve ability to hold lightweight objects such as a toothbrush or hairbrush during grooming tasks.   Goal status:  IN PROGRESS    LONG TERM GOALS: Target date: 09/18/23  Pt will decrease pain in the left hand to 3/10 or less to improve ability to sleep for 2+ consecutive hours without waking due to pain.   Goal status:  IN PROGRESS  2.  Pt will increase A/ROM of the left digits by 10+ degrees to improve ability to make a fist required for grasping objects during ADLs.   Goal status:  IN PROGRESS  3.  Pt will increase grip strength to 30# and pinch strength to 8# or greater to improve ability to grasp and maintain hold on a guitar.   Goal status:  IN PROGRESS  4.  Pt will demonstrate ability to make 75% or greater fist to improve ability to use left hand as non-dominant during ADLs and household tasks.   Goal status:  IN PROGRESS  ASSESSMENT:  CLINICAL IMPRESSION: Pt reports she still does not have healing in the hand but her bones have not moved anymore. Continued with edema management to work on intrinsics and edema around  MCPs. Completed A/ROM for digits to promote grasp and improved ROM. Attempted to increase hand gripper resistance to 29#, however pt unable due to pain. Updated tendon glides to HEP. Added coin manipulation activity today promoting grasp and digit isolation. Pt able to make 75% of fist at end of session compared to 50% at beginning of session.    PERFORMANCE DEFICITS: in functional skills including ADLs, IADLs, coordination, dexterity, sensation, edema, ROM, strength, pain, fascial restrictions, and UE functional use      PLAN:  OT FREQUENCY: 2x/week  OT DURATION: 8 weeks  PLANNED INTERVENTIONS: 97168 OT Re-evaluation, 97535 self care/ADL training, 81191 therapeutic exercise, 97530 therapeutic activity, 97112 neuromuscular re-education, 97140 manual therapy, 97035 ultrasound, 97014 electrical stimulation unattended, patient/family education, and DME and/or AE instructions  CONSULTED AND AGREED WITH PLAN OF CARE: Patient  PLAN FOR NEXT SESSION: Follow up on HEP, edema management techniques as needed, A/ROM, grasp development, hand strengthening    Lafonda Piety, OTR/L  [phone] 08/23/2023, 3:14 PM

## 2023-08-25 ENCOUNTER — Encounter (HOSPITAL_COMMUNITY): Payer: Self-pay | Admitting: Occupational Therapy

## 2023-08-25 ENCOUNTER — Ambulatory Visit (HOSPITAL_COMMUNITY): Admitting: Occupational Therapy

## 2023-08-25 DIAGNOSIS — R600 Localized edema: Secondary | ICD-10-CM

## 2023-08-25 DIAGNOSIS — M79642 Pain in left hand: Secondary | ICD-10-CM | POA: Diagnosis not present

## 2023-08-25 DIAGNOSIS — M25642 Stiffness of left hand, not elsewhere classified: Secondary | ICD-10-CM

## 2023-08-25 DIAGNOSIS — R29898 Other symptoms and signs involving the musculoskeletal system: Secondary | ICD-10-CM

## 2023-08-25 NOTE — Therapy (Signed)
 OUTPATIENT OCCUPATIONAL THERAPY ORTHO TREATMENT   Patient Name: Sandra Parsons MRN: [id number] DOB:1970-12-26, 53 y.o., female Today's Date: 08/25/2023    END OF SESSION:  OT End of Session - 08/25/23 1554     Visit Number 7    Number of Visits 16    Date for OT Re-Evaluation 09/18/23    Authorization Type [employer] Medicaid    Authorization Time Period 30 visit limit combined PT/OT/ST    Authorization - Visit Number 6    Authorization - Number of Visits 30    OT Start Time 1533   pt arrived late   OT Stop Time 1600    OT Time Calculation (min) 27 min    Activity Tolerance Patient tolerated treatment well    Behavior During Therapy WFL for tasks assessed/performed                   Past Medical History:  Diagnosis Date   CKD (chronic kidney disease)    Complication of anesthesia    Depression    Diabetes mellitus without complication (HCC)    Hepatitis B    per patient- diagnosed at age 6   Hepatitis C    Hyperlipidemia    Hypertension    Not currently on BP meds   Pinched nerve    PONV (postoperative nausea and vomiting)    Thyroid  disease    Past Surgical History:  Procedure Laterality Date   BACK SURGERY     x2   CARPAL TUNNEL RELEASE Right 12/08/2020   Procedure: CARPAL TUNNEL RELEASE;  Surgeon: Tonita Frater, MD;  Location: [facility];  Service: Orthopedics;  Laterality: Right;   CYSTECTOMY     L forearm   OPEN REDUCTION INTERNAL FIXATION (ORIF) METACARPAL Left 04/12/2023   Procedure: OPEN REDUCTION INTERNAL FIXATION (ORIF) METACARPAL;  Surgeon: Ltanya Rummer, MD;  Location: [facility];  Service: Orthopedics;  Laterality: Left;  block and mac   TRIGGER FINGER RELEASE Left 09/01/2020   Procedure: LEFT LONG FINGER  A-1 PULLEY RELEASE;  Surgeon: Tonita Frater, MD;  Location: [facility];  Service: Orthopedics;  Laterality: Left;  Left long finger trigger release   TRIGGER FINGER RELEASE Right 12/08/2020   Procedure: RELEASE TRIGGER FINGER/A-1 PULLEY;   Surgeon: Tonita Frater, MD;  Location: [facility];  Service: Orthopedics;  Laterality: Right;  Right index and long finger trigger release   Patient Active Problem List   Diagnosis Date Noted   Essential hypertension 08/18/2023   Type 2 diabetes mellitus with ophthalmic complication (HCC) 08/18/2023   Mixed hyperlipidemia 08/18/2023   Bilateral impacted cerumen 08/18/2023   Closed displaced fracture of fifth metacarpal bone of left hand with routine healing 08/18/2023   CKD (chronic kidney disease) stage 3, GFR 30-59 ml/min (HCC) 04/26/2022   Hypothyroidism 04/26/2022   Positive occult stool blood test 06/03/2021   Anemia 06/03/2021   Diabetic retinopathy (HCC) 04/28/2020   Positive hepatitis C antibody test 12/03/2019   PCP: None REFERRING PROVIDER: Dr. Ltanya Rummer  ONSET DATE: 04/12/23  REFERRING DIAG: I69.629B (ICD-10-CM) - Displaced fracture of neck of fifth metacarpal bone, left hand, initial encounter for open fracture  THERAPY DIAG:  Pain in left hand  Stiffness of left hand, not elsewhere classified  Other symptoms and signs involving the musculoskeletal system  Localized edema  Rationale for Evaluation and Treatment: Rehabilitation  SUBJECTIVE:   SUBJECTIVE STATEMENT: S: "This spot is super sensitive."    PERTINENT HISTORY: Pt is a 53 y/o female  who sustained a dog bite on 04/09/23 and underwent fifth metacarpal ORIF on 04/12/23. Due to nature of injury, dog bite, unable to fixate with plates internally. External pins were removed, however bone shifted therefore pt was placed back in a cast for an additional 4 weeks. Pt's bone now showing early healing.   Left fifth metacarpal neck open fracture I&D Left fifth metacarpal neck open fracture ORIF Left hand adjacent tissue transfer <10 sq cm for primary closure of soft tissue defect due to dog bite 4 view radiographs of the left hand with intraoperative interpreted Eischen.  PRECAUTIONS: Other: [place]   Handbook   WEIGHT BEARING RESTRICTIONS: Yes NWB  PAIN:  Are you having pain? Yes: NPRS scale:  5/10 Pain location: left hand Pain description: sore Aggravating factors: use, movement Relieving factors: rest  FALLS: Has patient fallen in last 6 months? No  PLOF: Independent  PATIENT GOALS: To be able to use her left hand.   NEXT MD VISIT: July 11th  OBJECTIVE:  Note: Objective measures were completed at Evaluation unless otherwise noted.  HAND DOMINANCE: Right  ADLs: Pt reports difficulty with using left hand during ADLs, currently in a splint and cannot lift or move objects. Pt cannot play the guitar right now. Pt cannot make a full grasp, has difficulty with incorporating LUE into grooming, dressing, bathing, etc.    FUNCTIONAL OUTCOME MEASURES: Quick Dash: 72.73  UPPER EXTREMITY ROM:      Active ROM Left eval Left  5/23  Thumb MCP (0-60) 42 42  Thumb IP (0-80) 62 76  Thumb Opposition to Small Finger -2.5cm  touching  Index MCP (0-90) 42  60  Index PIP (0-100)  88 90  Index DIP (0-70)  30 40  Long MCP (0-90)  42 59  Long PIP (0-100)  80 81  Long DIP (0-70)  26 48  Ring MCP (0-90)  30 48  Ring PIP (0-100)  36 88  Ring DIP (0-70)  18 40  Little MCP (0-90)  20 40  Little PIP (0-100)  40 80  Little DIP (0-70)  12 54  (Blank rows = not tested)  HAND FUNCTION: Grip strength: Right: 66 lbs; Left: 16 lbs, Lateral pinch: Right: 14 lbs, Left: 14 lbs, and 3 point pinch: Right: 12 lbs, Left: 8 lbs Grip Strength: Right: 60 lbs, Left: 18, Lateral pinch: Right: 18 lbs, Left: 16 and 3 point pinch Right: 16 lbs  Left: 12 lbs  COORDINATION: 9 Hole Peg test: Right: 20.42 sec; Left: 26.05 sec  SENSATION: Pt reports numbness in 4th and 5th digits from wrist to fingertips  EDEMA:   Left   Right Palm 21.5cm 20.25  MCPs 19.5cm 20.5cm   TREATMENT DATE:                                                                                                                             08/25/23 -A/ROM: MCP flexion, PIP flexion, composite digit flexion/extension, 10 reps -  Tendon glides: 10 reps -Grip strengthening: large and medium beads with gripper at 29#, vertical gripper -Grooved pegboard: pt holding pegs in palm and working on translating to fingertips to place into pegboard. Pt attempting to maintain hold of remaining pegs, dropping occasionally. Increased time to complete task. Removing pegs one at a time and keeping in palm, working on full grasp. Pt with mod difficulty on removal.  -Pinch task: pt using green clothespin and 3 point pinch to grasp and stack 4 towers of 5 sponges, removed and replaced in bucket with lateral pinch  08/23/23 -Retrograde massage and manual techniques to decrease edema in left digits and dorsal hand and promote improved mobility -A/ROM: MCP flexion, PIP flexion, composite digit flexion/extension, 10 reps -Tendon glides: 10 reps -Sponges: 23, 23 -Grip strengthening: large and medium beads with gripper at 25#, vertical gripper -Pinch task: pt using green clothespin and 3 point pinch to grasp and stack 4 towers of 5 sponges, removed and replaced in bucket with lateral pinch -Coin manipulation: pt holding coins in palm, working to translating to fingertips and drop into slotted container. Pt dropping coins intermittently during 3 rounds  08/12/23 -Retrograde massage and manual techniques to decrease edema in left digits and dorsal hand and promote improved mobility -Gentle passive stretching to left digits for flexion, 5 reps with 5" holds -A/ROM: MCP flexion, PIP flexion, composite digit flexion/extension, 10 reps -Pinch task: pt using green clothespin and 3 point pinch to grasp and place onto rod  -Theraputty: red-flatten and cookie cutter to address grip strength, 3 tip pinch across log    PATIENT EDUCATION: Education details: discussed contacting MD if sore spot on hand continues or worsens Person educated: Patient Education method:  Explanation, Demonstration, and Handouts Education comprehension: verbalized understanding and returned demonstration  HOME EXERCISE PROGRAM: Eval: finger A/ROM 08/04/23: flexion glove wear; theraputty grip and pinch-yellow 08/23/23: tendon glides  GOALS: Goals reviewed with patient? Yes  SHORT TERM GOALS: Target date: 08/18/23  Pt will be provided with and educated on HEP to improve mobility in the left hand required for use during ADLs as non-dominant.   Goal status:  IN PROGRESS  2.  Pt will reduce edema in the left hand to minimal amounts to improve mobility required for achieving a full fist.   Goal status:  IN PROGRESS  3.  Pt will register grip strength in the left hand at 10# and pinch strength at 4# or greater to improve ability to hold lightweight objects such as a toothbrush or hairbrush during grooming tasks.   Goal status:  IN PROGRESS    LONG TERM GOALS: Target date: 09/18/23  Pt will decrease pain in the left hand to 3/10 or less to improve ability to sleep for 2+ consecutive hours without waking due to pain.   Goal status:  IN PROGRESS  2.  Pt will increase A/ROM of the left digits by 10+ degrees to improve ability to make a fist required for grasping objects during ADLs.   Goal status:  IN PROGRESS  3.  Pt will increase grip strength to 30# and pinch strength to 8# or greater to improve ability to grasp and maintain hold on a guitar.   Goal status:  IN PROGRESS  4.  Pt will demonstrate ability to make 75% or greater fist to improve ability to use left hand as non-dominant during ADLs and household tasks.   Goal status:  IN PROGRESS     ASSESSMENT:  CLINICAL IMPRESSION: Pt reports she has  a spot on her 5th digit MCP that is very painful. OT noting raised white spot with redness around it, looks similar to when a stitch is attempting to work it's way out of the skin. Pt very sensitive in this area, educated on watching for signs of infection and to contact  MD if note any heat, worsening edema or redness, drainage, or if the pain does not resolve. Session focusing on A/ROM and development of grasp. Added grooved pegboard task today, increased hand gripper resistance with min difficulty. Verbal cuing for form and technique.    PERFORMANCE DEFICITS: in functional skills including ADLs, IADLs, coordination, dexterity, sensation, edema, ROM, strength, pain, fascial restrictions, and UE functional use      PLAN:  OT FREQUENCY: 2x/week  OT DURATION: 8 weeks  PLANNED INTERVENTIONS: 97168 OT Re-evaluation, 97535 self care/ADL training, 25366 therapeutic exercise, 97530 therapeutic activity, 97112 neuromuscular re-education, 97140 manual therapy, 97035 ultrasound, 97014 electrical stimulation unattended, patient/family education, and DME and/or AE instructions  CONSULTED AND AGREED WITH PLAN OF CARE: Patient  PLAN FOR NEXT SESSION: Follow up on HEP, edema management techniques as needed, A/ROM, grasp development, hand strengthening    Lafonda Piety, OTR/L  [phone] 08/25/2023, 4:01 PM

## 2023-08-29 ENCOUNTER — Other Ambulatory Visit: Payer: Self-pay

## 2023-08-29 ENCOUNTER — Encounter (HOSPITAL_COMMUNITY): Payer: Self-pay | Admitting: Occupational Therapy

## 2023-08-29 ENCOUNTER — Ambulatory Visit (HOSPITAL_COMMUNITY): Admitting: Occupational Therapy

## 2023-08-29 DIAGNOSIS — M25642 Stiffness of left hand, not elsewhere classified: Secondary | ICD-10-CM

## 2023-08-29 DIAGNOSIS — M79642 Pain in left hand: Secondary | ICD-10-CM | POA: Diagnosis not present

## 2023-08-29 DIAGNOSIS — R600 Localized edema: Secondary | ICD-10-CM

## 2023-08-29 DIAGNOSIS — R29898 Other symptoms and signs involving the musculoskeletal system: Secondary | ICD-10-CM

## 2023-08-29 MED ORDER — GABAPENTIN 100 MG PO CAPS: 100.0000 mg | ORAL_CAPSULE | Freq: Two times a day (BID) | ORAL | 2 refills | Status: DC

## 2023-08-29 NOTE — Therapy (Signed)
 OUTPATIENT OCCUPATIONAL THERAPY ORTHO TREATMENT   Patient Name: Sandra Parsons MRN: [id number] DOB:1971/02/20, 53 y.o., female Today's Date: 08/29/2023    END OF SESSION:  OT End of Session - 08/29/23 1605     Visit Number 8    Number of Visits 16    Date for OT Re-Evaluation 09/18/23    Authorization Type [employer]    Authorization Time Period 30 visit limit combined PT/OT/ST    Authorization - Visit Number 7    Authorization - Number of Visits 30    OT Start Time 1427    OT Stop Time 1512    OT Time Calculation (min) 45 min    Activity Tolerance Patient tolerated treatment well    Behavior During Therapy WFL for tasks assessed/performed                    Past Medical History:  Diagnosis Date   CKD (chronic kidney disease)    Complication of anesthesia    Depression    Diabetes mellitus without complication (HCC)    Hepatitis B    per patient- diagnosed at age 65   Hepatitis C    Hyperlipidemia    Hypertension    Not currently on BP meds   Pinched nerve    PONV (postoperative nausea and vomiting)    Thyroid  disease    Past Surgical History:  Procedure Laterality Date   BACK SURGERY     x2   CARPAL TUNNEL RELEASE Right 12/08/2020   Procedure: CARPAL TUNNEL RELEASE;  Surgeon: Tonita Frater, MD;  Location: [facility];  Service: Orthopedics;  Laterality: Right;   CYSTECTOMY     L forearm   OPEN REDUCTION INTERNAL FIXATION (ORIF) METACARPAL Left 04/12/2023   Procedure: OPEN REDUCTION INTERNAL FIXATION (ORIF) METACARPAL;  Surgeon: Ltanya Rummer, MD;  [facility]:  [facility];  Service: Orthopedics;  Laterality: Left;  block and mac   TRIGGER FINGER RELEASE Left 09/01/2020   Procedure: LEFT LONG FINGER  A-1 PULLEY RELEASE;  Surgeon: Tonita Frater, MD;  Location: [facility];  Service: Orthopedics;  Laterality: Left;  Left long finger trigger release   TRIGGER FINGER RELEASE Right 12/08/2020   Procedure: RELEASE TRIGGER FINGER/A-1 PULLEY;  Surgeon:  Tonita Frater, MD;  Location: [facility];  Service: Orthopedics;  Laterality: Right;  Right index and long finger trigger release   Patient Active Problem List   Diagnosis Date Noted   Essential hypertension 08/18/2023   Type 2 diabetes mellitus with ophthalmic complication (HCC) 08/18/2023   Mixed hyperlipidemia 08/18/2023   Bilateral impacted cerumen 08/18/2023   Closed displaced fracture of fifth metacarpal bone of left hand with routine healing 08/18/2023   CKD (chronic kidney disease) stage 3, GFR 30-59 ml/min (HCC) 04/26/2022   Hypothyroidism 04/26/2022   Positive occult stool blood test 06/03/2021   Anemia 06/03/2021   Diabetic retinopathy (HCC) 04/28/2020   Positive hepatitis C antibody test 12/03/2019   PCP: None REFERRING PROVIDER: Dr. Ltanya Rummer  ONSET DATE: 04/12/23  REFERRING DIAG: W29.562Z (ICD-10-CM) - Displaced fracture of neck of fifth metacarpal bone, left hand, initial encounter for open fracture  THERAPY DIAG:  Pain in left hand  Stiffness of left hand, not elsewhere classified  Other symptoms and signs involving the musculoskeletal system  Localized edema  Rationale for Evaluation and Treatment: Rehabilitation  SUBJECTIVE:   SUBJECTIVE STATEMENT: S: "It is feeling much better."   PERTINENT HISTORY: Pt is a 53 y/o female who sustained a dog  bite on 04/09/23 and underwent fifth metacarpal ORIF on 04/12/23. Due to nature of injury, dog bite, unable to fixate with plates internally. External pins were removed, however bone shifted therefore pt was placed back in a cast for an additional 4 weeks. Pt's bone now showing early healing.   Left fifth metacarpal neck open fracture I&D Left fifth metacarpal neck open fracture ORIF Left hand adjacent tissue transfer <10 sq cm for primary closure of soft tissue defect due to dog bite 4 view radiographs of the left hand with intraoperative interpreted Eischen.  PRECAUTIONS: Other: [place]  Handbook   WEIGHT BEARING  RESTRICTIONS: Yes NWB  PAIN:  Are you having pain? No  FALLS: Has patient fallen in last 6 months? No  PLOF: Independent  PATIENT GOALS: To be able to use her left hand.   NEXT MD VISIT: July 11th  OBJECTIVE:  Note: Objective measures were completed at Evaluation unless otherwise noted.  HAND DOMINANCE: Right  ADLs: Pt reports difficulty with using left hand during ADLs, currently in a splint and cannot lift or move objects. Pt cannot play the guitar right now. Pt cannot make a full grasp, has difficulty with incorporating LUE into grooming, dressing, bathing, etc.    FUNCTIONAL OUTCOME MEASURES: Quick Dash: 72.73  UPPER EXTREMITY ROM:      Active ROM Left eval Left  5/23  Thumb MCP (0-60) 42 42  Thumb IP (0-80) 62 76  Thumb Opposition to Small Finger -2.5cm  touching  Index MCP (0-90) 42  60  Index PIP (0-100)  88 90  Index DIP (0-70)  30 40  Long MCP (0-90)  42 59  Long PIP (0-100)  80 81  Long DIP (0-70)  26 48  Ring MCP (0-90)  30 48  Ring PIP (0-100)  36 88  Ring DIP (0-70)  18 40  Little MCP (0-90)  20 40  Little PIP (0-100)  40 80  Little DIP (0-70)  12 54  (Blank rows = not tested)  HAND FUNCTION: Grip strength: Right: 66 lbs; Left: 16 lbs, Lateral pinch: Right: 14 lbs, Left: 14 lbs, and 3 point pinch: Right: 12 lbs, Left: 8 lbs Grip Strength: Right: 60 lbs, Left: 18, Lateral pinch: Right: 18 lbs, Left: 16 and 3 point pinch Right: 16 lbs  Left: 12 lbs  COORDINATION: 9 Hole Peg test: Right: 20.42 sec; Left: 26.05 sec  SENSATION: Pt reports numbness in 4th and 5th digits from wrist to fingertips  EDEMA:   Left   Right Palm 21.5cm 20.25  MCPs 19.5cm 20.5cm   TREATMENT DATE:                                                                                                                            08/29/23 -Sponges: 23, 23 -A/ROM: MCP flexion, PIP flexion, composite digit flexion/extension, 10 reps -Tendon glides: 10 reps -Small pegboard: Pt holding  pegs in left palm, working to place according to provided pattern  without dropping. Pt intermittently dropping pegs, mod difficulty closing fist to keep a tight grasp on the pegs. When removing, removed and kept in palm, was able to remove ~5 before dropping pegs.  -Grip strengthening: large, medium, small beads with gripper at 35#, vertical gripper -Theraputty: red-flatten, cutting circles into putty using pvc pipe, pvc pipe pull, gripping, pinching, finding 4/5 beads in putty  08/25/23 -A/ROM: MCP flexion, PIP flexion, composite digit flexion/extension, 10 reps -Tendon glides: 10 reps -Grip strengthening: large and medium beads with gripper at 29#, vertical gripper -Grooved pegboard: pt holding pegs in palm and working on translating to fingertips to place into pegboard. Pt attempting to maintain hold of remaining pegs, dropping occasionally. Increased time to complete task. Removing pegs one at a time and keeping in palm, working on full grasp. Pt with mod difficulty on removal.  -Pinch task: pt using green clothespin and 3 point pinch to grasp and stack 4 towers of 5 sponges, removed and replaced in bucket with lateral pinch  08/23/23 -Retrograde massage and manual techniques to decrease edema in left digits and dorsal hand and promote improved mobility -A/ROM: MCP flexion, PIP flexion, composite digit flexion/extension, 10 reps -Tendon glides: 10 reps -Sponges: 23, 23 -Grip strengthening: large and medium beads with gripper at 25#, vertical gripper -Pinch task: pt using green clothespin and 3 point pinch to grasp and stack 4 towers of 5 sponges, removed and replaced in bucket with lateral pinch -Coin manipulation: pt holding coins in palm, working to translating to fingertips and drop into slotted container. Pt dropping coins intermittently during 3 rounds    PATIENT EDUCATION: Education details: reviewed HEP Person educated: Patient Education method: Programmer, multimedia, Demonstration, and  Handouts Education comprehension: verbalized understanding and returned demonstration  HOME EXERCISE PROGRAM: Eval: finger A/ROM 08/04/23: flexion glove wear; theraputty grip and pinch-yellow 08/23/23: tendon glides  GOALS: Goals reviewed with patient? Yes  SHORT TERM GOALS: Target date: 08/18/23  Pt will be provided with and educated on HEP to improve mobility in the left hand required for use during ADLs as non-dominant.   Goal status:  IN PROGRESS  2.  Pt will reduce edema in the left hand to minimal amounts to improve mobility required for achieving a full fist.   Goal status:  IN PROGRESS  3.  Pt will register grip strength in the left hand at 10# and pinch strength at 4# or greater to improve ability to hold lightweight objects such as a toothbrush or hairbrush during grooming tasks.   Goal status:  IN PROGRESS    LONG TERM GOALS: Target date: 09/18/23  Pt will decrease pain in the left hand to 3/10 or less to improve ability to sleep for 2+ consecutive hours without waking due to pain.   Goal status:  IN PROGRESS  2.  Pt will increase A/ROM of the left digits by 10+ degrees to improve ability to make a fist required for grasping objects during ADLs.   Goal status:  IN PROGRESS  3.  Pt will increase grip strength to 30# and pinch strength to 8# or greater to improve ability to grasp and maintain hold on a guitar.   Goal status:  IN PROGRESS  4.  Pt will demonstrate ability to make 75% or greater fist to improve ability to use left hand as non-dominant during ADLs and household tasks.   Goal status:  IN PROGRESS     ASSESSMENT:  CLINICAL IMPRESSION: Pt reports she her hand is feeling much better, there  is a stitch coming through the skin. Pt with little to no redness at the site, minimal edema. Continued with digit ROM, resumed grip strengthening progressing to small size beads and red theraputty. Pt able to make 75% of a full fist after activities today. No  increased pain, verbal cuing for form and technique.   Pt with cyst-like area at volar base of 5th digit MCP on right hand, recommended scheduling appt for PCP to assess and advise on further management.    PERFORMANCE DEFICITS: in functional skills including ADLs, IADLs, coordination, dexterity, sensation, edema, ROM, strength, pain, fascial restrictions, and UE functional use      PLAN:  OT FREQUENCY: 2x/week  OT DURATION: 8 weeks  PLANNED INTERVENTIONS: 97168 OT Re-evaluation, 97535 self care/ADL training, 16109 therapeutic exercise, 97530 therapeutic activity, 97112 neuromuscular re-education, 97140 manual therapy, 97035 ultrasound, 97014 electrical stimulation unattended, patient/family education, and DME and/or AE instructions  CONSULTED AND AGREED WITH PLAN OF CARE: Patient  PLAN FOR NEXT SESSION: Follow up on HEP, edema management techniques as needed, A/ROM, grasp development, hand strengthening    Lafonda Piety, OTR/L  [phone] 08/29/2023, 4:06 PM

## 2023-08-31 ENCOUNTER — Ambulatory Visit: Payer: Self-pay

## 2023-08-31 ENCOUNTER — Ambulatory Visit

## 2023-08-31 VITALS — BP 104/68 | HR 74 | Ht 67.0 in | Wt 193.0 lb

## 2023-08-31 DIAGNOSIS — R2231 Localized swelling, mass and lump, right upper limb: Secondary | ICD-10-CM | POA: Diagnosis not present

## 2023-08-31 MED ORDER — DICLOFENAC SODIUM 1 % EX GEL: 2.0000 g | Freq: Four times a day (QID) | CUTANEOUS | 0 refills | Status: DC

## 2023-08-31 MED ORDER — HYDROCODONE-ACETAMINOPHEN 5-325 MG PO TABS: 1.0000 | ORAL_TABLET | Freq: Four times a day (QID) | ORAL | 0 refills | Status: DC | PRN

## 2023-08-31 NOTE — Telephone Encounter (Signed)
 Patient scheduled.

## 2023-08-31 NOTE — Telephone Encounter (Signed)
 Copied from CRM [id number]. Topic: Clinical - Red Word Triage >> Aug 31, 2023 10:28 AM Everlene Hobby D wrote: Has had surgery on right hand 3 years ago now it has a knot on it. It's getting pretty big and it's painful and yellow looking for about 5 days. Says her other hand hurts to due to a dog bite says she sees a therapist for it. Having a hard time using hands.    Reason for Disposition  Looks like a boil, infected sore, deep ulcer or other infected rash (spreading redness, pus)  Answer Assessment - Initial Assessment Questions 1. ONSET: When did the pain start?     5 days ago 2. LOCATION: Where is the pain located?     Right hand 3. PAIN: How bad is the pain? (Scale 1-10; or mild, moderate, severe)   - MILD (1-3): doesn't interfere with normal activities   - MODERATE (4-7): interferes with normal activities (e.g., work or school) or awakens from sleep   - SEVERE (8-10): excruciating pain, unable to use hand at all     7/10 4. WORK OR EXERCISE: Has there been any recent work or exercise that involved this part (i.e., hand or wrist) of the body?     No 5. CAUSE: What do you think is causing the pain?     Knot on hand that has grown in size and is now yellow in the center 6. AGGRAVATING FACTORS: What makes the pain worse? (e.g., using computer)     Using hand 7. OTHER SYMPTOMS: Do you have any other symptoms? (e.g., neck pain, swelling, rash, numbness, fever)     Some left hand pain as well  Protocols used: Hand and Wrist Pain-A-AH   FYI Only or Action Required?: FYI only for provider  Patient was last seen in primary care on 08/18/2023 by Meldon Sport, MD. Called Nurse Triage reporting Hand Pain. Symptoms began several days ago.  Symptoms are: gradually worsening.  Triage Disposition: See Physician Within 24 Hours  Patient/caregiver understands and will follow disposition?: Yes

## 2023-08-31 NOTE — Progress Notes (Signed)
   Acute Office Visit  Subjective:     Patient ID: Sandra Parsons, female    DOB: Oct 08, 1970, 53 y.o.   MRN: [id number]  Chief Complaint  Patient presents with   Medical Management of Chronic Issues    Pt states Right hand pain and knot on right hand causing the pain,Knot has color to it now and it didn't before    HPI Patient is in today for right hand swelling and pain  Pain  She reports new onset palm of rt hand at base of 2nd & 3rd fingers pain. was not an injury that may have caused the pain. The pain started a few weeks ago and is worsening. The pain does not radiate . The pain is described as soreness and throbbing, is 6/10 in intensity, occurring constantly. Symptoms are worse in the: mid-day  Aggravating factors: use of hand Relieving factors: none.  She has tried application of ice, acetaminophen , and topical anesthetics with no relief.   --------------------------------------------------------------------------------------------------- ROS      Objective:    BP 104/68   Pulse 74   Ht 5' 7 (1.702 m)   Wt 193 lb 0.6 oz (87.6 kg)   LMP 04/26/2016   SpO2 95%   BMI 30.23 kg/m    Physical Exam Vitals and nursing note reviewed.  Constitutional:      Appearance: Normal appearance.   Eyes:     Extraocular Movements: Extraocular movements intact.     Pupils: Pupils are equal, round, and reactive to light.    Musculoskeletal:     Right hand: Swelling (large area of swelling at base of 2nd & 3rd fingers on palmar aspect) and tenderness present. Decreased range of motion. Decreased strength. Normal pulse.     Left hand: Normal.   Neurological:     Mental Status: She is alert and oriented to person, place, and time.   Psychiatric:        Mood and Affect: Mood normal.        Thought Content: Thought content normal.     No results found for any visits on 08/31/23.      Assessment & Plan:   Problem List Items Addressed This Visit   None Visit  Diagnoses       Localized swelling on right hand    -  Primary   Recommend she follow-up with Dr. Ernesta Heading, who performed her trigger finger surgery a few years ago.  She is unable to take NSAIDs so Rx for pain meds provided.   Relevant Medications   HYDROcodone -acetaminophen  (NORCO/VICODIN) 5-325 MG tablet   diclofenac  Sodium (VOLTAREN  ARTHRITIS PAIN) 1 % GEL   Other Relevant Orders   Ambulatory referral to Orthopedic Surgery       Meds ordered this encounter  Medications   HYDROcodone -acetaminophen  (NORCO/VICODIN) 5-325 MG tablet    Sig: Take 1 tablet by mouth every 6 (six) hours as needed for up to 5 days for moderate pain (pain score 4-6).    Dispense:  20 tablet    Refill:  0   diclofenac  Sodium (VOLTAREN  ARTHRITIS PAIN) 1 % GEL    Sig: Apply 2 g topically 4 (four) times daily.    Dispense:  150 g    Refill:  0    No follow-ups on file.  Alison Irvine, FNP

## 2023-09-01 ENCOUNTER — Encounter (HOSPITAL_COMMUNITY): Admitting: Occupational Therapy

## 2023-09-02 ENCOUNTER — Telehealth: Payer: Self-pay

## 2023-09-02 NOTE — Telephone Encounter (Signed)
 Who is your primary care physician: DR.RUTWIK PATEL  Reasons for the colonoscopy: SCREENING  Have you had a colonoscopy before?  NO  Do you have family history of colon cancer? NO  Previous colonoscopy with polyps removed? NO  Do you have a history colorectal cancer?   NO  Are you diabetic? If yes, Type 1 or Type 2?    YES TYPE 1  Do you have a prosthetic or mechanical heart valve? NO  Do you have a pacemaker/defibrillator?   NO  Have you had endocarditis/atrial fibrillation? NO  Have you had joint replacement within the last 12 months?  NO  Do you tend to be constipated or have to use laxatives? NO  Do you have any history of drugs or alchohol?  YES  Do you use supplemental oxygen ?  NO  Have you had a stroke or heart attack within the last 6 months? NO  Do you take weight loss medication?  YES  For female patients: have you had a hysterectomy?  NO                                      are you post menopausal?       NO                                            do you still have your menstrual cycle? NO      Do you take any blood-thinning medications such as: (aspirin, warfarin, Plavix, Aggrenox)  NO  If yes we need the name, milligram, dosage and who is prescribing doctor  Current Outpatient Medications on File Prior to Visit  Medication Sig Dispense Refill   amLODipine  (NORVASC ) 10 MG tablet Take 1 tablet (10 mg total) by mouth daily. 90 tablet 3   atorvastatin  (LIPITOR) 20 MG tablet Take 1 tablet (20 mg total) by mouth daily. 90 tablet 1   carvedilol  (COREG ) 6.25 MG tablet Take 1 tablet (6.25 mg total) by mouth 2 (two) times daily with a meal. 180 tablet 1   diclofenac  Sodium (VOLTAREN  ARTHRITIS PAIN) 1 % GEL Apply 2 g topically 4 (four) times daily. 150 g 0   gabapentin  (NEURONTIN ) 100 MG capsule Take 1 capsule (100 mg total) by mouth 2 (two) times daily. 60 capsule 2   hydrALAZINE  (APRESOLINE ) 25 MG tablet Take 1 tablet (25 mg total) by mouth 2 (two) times daily. 180  tablet 1   HYDROcodone -acetaminophen  (NORCO/VICODIN) 5-325 MG tablet Take 1 tablet by mouth every 6 (six) hours as needed for up to 5 days for moderate pain (pain score 4-6). 20 tablet 0   insulin glargine  (LANTUS  SOLOSTAR) 100 UNIT/ML Solostar Pen Inject 28 Units into the skin daily. 15 mL 2   levothyroxine  (SYNTHROID ) 100 MCG tablet Take 1 tablet (100 mcg total) by mouth daily. 90 tablet 1   metFORMIN  (GLUCOPHAGE ) 500 MG tablet Take 1 tablet (500 mg total) by mouth 2 (two) times daily with a meal. 180 tablet 1   methocarbamol  (ROBAXIN ) 500 MG tablet Take 1 tablet (500 mg total) by mouth 2 (two) times daily. 20 tablet 0   ondansetron  (ZOFRAN -ODT) 4 MG disintegrating tablet Take 1 tablet (4 mg total) by mouth every 8 (eight) hours as needed for nausea or vomiting. 20 tablet 0  Semaglutide ,0.25 or 0.5MG /DOS, (OZEMPIC , 0.25 OR 0.5 MG/DOSE,) 2 MG/3ML SOPN Inject 0.25 mg into the skin every 7 (seven) days for 28 days, THEN 0.5 mg every 7 (seven) days. 3 mL 1   triamcinolone  (KENALOG ) 0.025 % ointment Apply 1 application topically at bedtime as needed. 15 g 1   No current facility-administered medications on file prior to visit.    Allergies  Allergen Reactions   Prednisone  Other (See Comments)    Poor tolerance due to Diabetes make my sugar go up Other reaction(s): Other (see comments) Poor tolerance due to Diabetes make my sugar go up     Pharmacy: [facility] [place]  Primary Insurance Name: [facility] [id number]  Best number where you can be reached: [phone]

## 2023-09-05 ENCOUNTER — Ambulatory Visit (HOSPITAL_COMMUNITY): Admitting: Occupational Therapy

## 2023-09-05 ENCOUNTER — Encounter (HOSPITAL_COMMUNITY): Payer: Self-pay | Admitting: Occupational Therapy

## 2023-09-05 DIAGNOSIS — R29898 Other symptoms and signs involving the musculoskeletal system: Secondary | ICD-10-CM

## 2023-09-05 DIAGNOSIS — M25642 Stiffness of left hand, not elsewhere classified: Secondary | ICD-10-CM

## 2023-09-05 DIAGNOSIS — M79642 Pain in left hand: Secondary | ICD-10-CM

## 2023-09-05 DIAGNOSIS — R600 Localized edema: Secondary | ICD-10-CM

## 2023-09-05 NOTE — Therapy (Signed)
 OUTPATIENT OCCUPATIONAL THERAPY ORTHO TREATMENT   Patient Name: Sandra Parsons MRN: [id number] DOB:10/26/1970, 53 y.o., female Today's Date: 09/05/2023    END OF SESSION:  OT End of Session - 09/05/23 1508     Visit Number 9    Number of Visits 16    Date for OT Re-Evaluation 09/18/23    Authorization Type [employer] Medicaid    Authorization Time Period 30 visit limit combined PT/OT/ST    Authorization - Visit Number 8    Authorization - Number of Visits 30    OT Start Time 1430    OT Stop Time 1509    OT Time Calculation (min) 39 min    Activity Tolerance Patient tolerated treatment well    Behavior During Therapy WFL for tasks assessed/performed                  Past Medical History:  Diagnosis Date   CKD (chronic kidney disease)    Complication of anesthesia    Depression    Diabetes mellitus without complication (HCC)    Hepatitis B    per patient- diagnosed at age 33   Hepatitis C    Hyperlipidemia    Hypertension    Not currently on BP meds   Pinched nerve    PONV (postoperative nausea and vomiting)    Thyroid  disease    Past Surgical History:  Procedure Laterality Date   BACK SURGERY     x2   CARPAL TUNNEL RELEASE Right 12/08/2020   Procedure: CARPAL TUNNEL RELEASE;  Surgeon: Tonita Frater, MD;  Location: [facility];  Service: Orthopedics;  Laterality: Right;   CYSTECTOMY     L forearm   OPEN REDUCTION INTERNAL FIXATION (ORIF) METACARPAL Left 04/12/2023   Procedure: OPEN REDUCTION INTERNAL FIXATION (ORIF) METACARPAL;  Surgeon: Ltanya Rummer, MD;  Location: [facility];  Service: Orthopedics;  Laterality: Left;  block and mac   TRIGGER FINGER RELEASE Left 09/01/2020   Procedure: LEFT LONG FINGER  A-1 PULLEY RELEASE;  Surgeon: Tonita Frater, MD;  Location: [facility];  Service: Orthopedics;  Laterality: Left;  Left long finger trigger release   TRIGGER FINGER RELEASE Right 12/08/2020   Procedure: RELEASE TRIGGER FINGER/A-1 PULLEY;  Surgeon: Tonita Frater, MD;  Location: [facility];  Service: Orthopedics;  Laterality: Right;  Right index and long finger trigger release   Patient Active Problem List   Diagnosis Date Noted   Essential hypertension 08/18/2023   Type 2 diabetes mellitus with ophthalmic complication (HCC) 08/18/2023   Mixed hyperlipidemia 08/18/2023   Bilateral impacted cerumen 08/18/2023   Closed displaced fracture of fifth metacarpal bone of left hand with routine healing 08/18/2023   CKD (chronic kidney disease) stage 3, GFR 30-59 ml/min (HCC) 04/26/2022   Hypothyroidism 04/26/2022   Positive occult stool blood test 06/03/2021   Anemia 06/03/2021   Diabetic retinopathy (HCC) 04/28/2020   Positive hepatitis C antibody test 12/03/2019   PCP: None REFERRING PROVIDER: Dr. Ltanya Rummer  ONSET DATE: 04/12/23  REFERRING DIAG: W09.811B (ICD-10-CM) - Displaced fracture of neck of fifth metacarpal bone, left hand, initial encounter for open fracture  THERAPY DIAG:  Pain in left hand  Stiffness of left hand, not elsewhere classified  Other symptoms and signs involving the musculoskeletal system  Localized edema  Rationale for Evaluation and Treatment: Rehabilitation  SUBJECTIVE:   SUBJECTIVE STATEMENT: S: This finger will not let me do nothing.    PERTINENT HISTORY: Pt is a 53 y/o female who sustained  a dog bite on 04/09/23 and underwent fifth metacarpal ORIF on 04/12/23. Due to nature of injury, dog bite, unable to fixate with plates internally. External pins were removed, however bone shifted therefore pt was placed back in a cast for an additional 4 weeks. Pt's bone now showing early healing.   Left fifth metacarpal neck open fracture I&D Left fifth metacarpal neck open fracture ORIF Left hand adjacent tissue transfer <10 sq cm for primary closure of soft tissue defect due to dog bite 4 view radiographs of the left hand with intraoperative interpreted Eischen.  PRECAUTIONS: Other: [place]  Handbook   WEIGHT  BEARING RESTRICTIONS: Yes NWB  PAIN:  Are you having pain? No  FALLS: Has patient fallen in last 6 months? No  PLOF: Independent  PATIENT GOALS: To be able to use her left hand.   NEXT MD VISIT: July 11th  OBJECTIVE:  Note: Objective measures were completed at Evaluation unless otherwise noted.  HAND DOMINANCE: Right  ADLs: Pt reports difficulty with using left hand during ADLs, currently in a splint and cannot lift or move objects. Pt cannot play the guitar right now. Pt cannot make a full grasp, has difficulty with incorporating LUE into grooming, dressing, bathing, etc.    FUNCTIONAL OUTCOME MEASURES: Quick Dash: 72.73  UPPER EXTREMITY ROM:      Active ROM Left eval Left  5/23  Thumb MCP (0-60) 42 42  Thumb IP (0-80) 62 76  Thumb Opposition to Small Finger -2.5cm  touching  Index MCP (0-90) 42  60  Index PIP (0-100)  88 90  Index DIP (0-70)  30 40  Long MCP (0-90)  42 59  Long PIP (0-100)  80 81  Long DIP (0-70)  26 48  Ring MCP (0-90)  30 48  Ring PIP (0-100)  36 88  Ring DIP (0-70)  18 40  Little MCP (0-90)  20 40  Little PIP (0-100)  40 80  Little DIP (0-70)  12 54  (Blank rows = not tested)  HAND FUNCTION: Grip strength: Right: 66 lbs; Left: 16 lbs, Lateral pinch: Right: 14 lbs, Left: 14 lbs, and 3 point pinch: Right: 12 lbs, Left: 8 lbs Grip Strength: Right: 60 lbs, Left: 18, Lateral pinch: Right: 18 lbs, Left: 16 and 3 point pinch Right: 16 lbs  Left: 12 lbs  COORDINATION: 9 Hole Peg test: Right: 20.42 sec; Left: 26.05 sec  SENSATION: Pt reports numbness in 4th and 5th digits from wrist to fingertips  EDEMA:   Left   Right Palm 21.5cm 20.25  MCPs 19.5cm 20.5cm   TREATMENT DATE:                                                                                                                            09/05/23 -Towel crumple: 10 reps -Grip strengthening: large, medium beads with hand gripper at 37#, small beads with gripper at 35#, vertical  gripper -Coin manipulation: pt holding coins in palm, translating  to fingertips, placing into slotted container; 3 rounds, dropping coins intermittently -A/ROM: MCP flexion, PIP flexion, composite digit flexion/extension, 10 reps -Small pegboard: pt using tweezers to grasp and place pegs into pegboard in provided pattern. Min difficulty with tweezers and maintaining pinch. To remove: pt removing one at a time and holding as many as possible in the palm at one time. 2 rounds to remove.  08/29/23 -Sponges: 23, 23 -A/ROM: MCP flexion, PIP flexion, composite digit flexion/extension, 10 reps -Tendon glides: 10 reps -Small pegboard: Pt holding pegs in left palm, working to place according to provided pattern without dropping. Pt intermittently dropping pegs, mod difficulty closing fist to keep a tight grasp on the pegs. When removing, removed and kept in palm, was able to remove ~5 before dropping pegs.  -Grip strengthening: large, medium, small beads with gripper at 35#, vertical gripper -Theraputty: red-flatten, cutting circles into putty using pvc pipe, pvc pipe pull, gripping, pinching, finding 4/5 beads in putty  08/25/23 -A/ROM: MCP flexion, PIP flexion, composite digit flexion/extension, 10 reps -Tendon glides: 10 reps -Grip strengthening: large and medium beads with gripper at 29#, vertical gripper -Grooved pegboard: pt holding pegs in palm and working on translating to fingertips to place into pegboard. Pt attempting to maintain hold of remaining pegs, dropping occasionally. Increased time to complete task. Removing pegs one at a time and keeping in palm, working on full grasp. Pt with mod difficulty on removal.  -Pinch task: pt using green clothespin and 3 point pinch to grasp and stack 4 towers of 5 sponges, removed and replaced in bucket with lateral pinch    PATIENT EDUCATION: Education details: reviewed HEP Person educated: Patient Education method: Programmer, multimedia, Facilities manager, and  Handouts Education comprehension: verbalized understanding and returned demonstration  HOME EXERCISE PROGRAM: Eval: finger A/ROM 08/04/23: flexion glove wear; theraputty grip and pinch-yellow 08/23/23: tendon glides  GOALS: Goals reviewed with patient? Yes  SHORT TERM GOALS: Target date: 08/18/23  Pt will be provided with and educated on HEP to improve mobility in the left hand required for use during ADLs as non-dominant.   Goal status:  IN PROGRESS  2.  Pt will reduce edema in the left hand to minimal amounts to improve mobility required for achieving a full fist.   Goal status:  IN PROGRESS  3.  Pt will register grip strength in the left hand at 10# and pinch strength at 4# or greater to improve ability to hold lightweight objects such as a toothbrush or hairbrush during grooming tasks.   Goal status:  IN PROGRESS    LONG TERM GOALS: Target date: 09/18/23  Pt will decrease pain in the left hand to 3/10 or less to improve ability to sleep for 2+ consecutive hours without waking due to pain.   Goal status:  IN PROGRESS  2.  Pt will increase A/ROM of the left digits by 10+ degrees to improve ability to make a fist required for grasping objects during ADLs.   Goal status:  IN PROGRESS  3.  Pt will increase grip strength to 30# and pinch strength to 8# or greater to improve ability to grasp and maintain hold on a guitar.   Goal status:  IN PROGRESS  4.  Pt will demonstrate ability to make 75% or greater fist to improve ability to use left hand as non-dominant during ADLs and household tasks.   Goal status:  IN PROGRESS     ASSESSMENT:  CLINICAL IMPRESSION: Pt reports she has an appointment with Dr. Ernesta Heading  in two weeks to look at the cyst-like area. Also is having the most difficulty with the left middle finger due to increased triggering and pain. The dog bite area is doing pretty well. Pt requiring increased time for hand gripper tasks today, especially with small beads.  Continued with fine motor work to promote grasp today. Pt with full fist today after exercises. Verbal cuing for form and technique.    PERFORMANCE DEFICITS: in functional skills including ADLs, IADLs, coordination, dexterity, sensation, edema, ROM, strength, pain, fascial restrictions, and UE functional use      PLAN:  OT FREQUENCY: 2x/week  OT DURATION: 8 weeks  PLANNED INTERVENTIONS: 97168 OT Re-evaluation, 97535 self care/ADL training, 91478 therapeutic exercise, 97530 therapeutic activity, 97112 neuromuscular re-education, 97140 manual therapy, 97035 ultrasound, 97014 electrical stimulation unattended, patient/family education, and DME and/or AE instructions  CONSULTED AND AGREED WITH PLAN OF CARE: Patient  PLAN FOR NEXT SESSION: Follow up on HEP, edema management techniques as needed, A/ROM, grasp development, hand strengthening    Lafonda Piety, OTR/L  [phone] 09/05/2023, 3:13 PM

## 2023-09-08 ENCOUNTER — Encounter (HOSPITAL_COMMUNITY): Payer: Self-pay | Admitting: Occupational Therapy

## 2023-09-08 ENCOUNTER — Ambulatory Visit (HOSPITAL_COMMUNITY): Admitting: Occupational Therapy

## 2023-09-08 DIAGNOSIS — M79642 Pain in left hand: Secondary | ICD-10-CM | POA: Diagnosis not present

## 2023-09-08 DIAGNOSIS — R29898 Other symptoms and signs involving the musculoskeletal system: Secondary | ICD-10-CM

## 2023-09-08 DIAGNOSIS — M25642 Stiffness of left hand, not elsewhere classified: Secondary | ICD-10-CM

## 2023-09-08 NOTE — Therapy (Signed)
 OUTPATIENT OCCUPATIONAL THERAPY ORTHO TREATMENT  REASSESSMENT AND DISCHARGE  Patient Name: Sandra Parsons MRN: [id number] DOB:Jun 15, 1970, 53 y.o., female Today's Date: 09/08/2023  OCCUPATIONAL THERAPY DISCHARGE SUMMARY  Visits from Start of Care: 10  Current functional level related to goals / functional outcomes: See below. Pt has met all goals and is demonstrating consistent ability to make a full fist, is using the left hand during ADLs and work tasks.    Remaining deficits: Numbness/tingling sensation, decreased grip strength   Education / Equipment: HEP   Patient agrees to discharge. Patient goals were met. Patient is being discharged due to being pleased with the current functional level..     END OF SESSION:  OT End of Session - 09/08/23 1535     Visit Number 10    Number of Visits 16    Date for OT Re-Evaluation 09/18/23    Authorization Type [employer] Medicaid    Authorization Time Period 30 visit limit combined PT/OT/ST    Authorization - Visit Number 9    Authorization - Number of Visits 30    OT Start Time 1518    OT Stop Time 1546    OT Time Calculation (min) 28 min    Activity Tolerance Patient tolerated treatment well    Behavior During Therapy WFL for tasks assessed/performed                   Past Medical History:  Diagnosis Date   CKD (chronic kidney disease)    Complication of anesthesia    Depression    Diabetes mellitus without complication (HCC)    Hepatitis B    per patient- diagnosed at age 54   Hepatitis C    Hyperlipidemia    Hypertension    Not currently on BP meds   Pinched nerve    PONV (postoperative nausea and vomiting)    Thyroid  disease    Past Surgical History:  Procedure Laterality Date   BACK SURGERY     x2   CARPAL TUNNEL RELEASE Right 12/08/2020   Procedure: CARPAL TUNNEL RELEASE;  Surgeon: Tonita Frater, MD;  Location: [facility];  Service: Orthopedics;  Laterality: Right;   CYSTECTOMY     L forearm   OPEN  REDUCTION INTERNAL FIXATION (ORIF) METACARPAL Left 04/12/2023   Procedure: OPEN REDUCTION INTERNAL FIXATION (ORIF) METACARPAL;  Surgeon: Ltanya Rummer, MD;  Location: [facility];  Service: Orthopedics;  Laterality: Left;  block and mac   TRIGGER FINGER RELEASE Left 09/01/2020   Procedure: LEFT LONG FINGER  A-1 PULLEY RELEASE;  Surgeon: Tonita Frater, MD;  Location: [facility];  Service: Orthopedics;  Laterality: Left;  Left long finger trigger release   TRIGGER FINGER RELEASE Right 12/08/2020   Procedure: RELEASE TRIGGER FINGER/A-1 PULLEY;  Surgeon: Tonita Frater, MD;  Location: [facility];  Service: Orthopedics;  Laterality: Right;  Right index and long finger trigger release   Patient Active Problem List   Diagnosis Date Noted   Essential hypertension 08/18/2023   Type 2 diabetes mellitus with ophthalmic complication (HCC) 08/18/2023   Mixed hyperlipidemia 08/18/2023   Bilateral impacted cerumen 08/18/2023   Closed displaced fracture of fifth metacarpal bone of left hand with routine healing 08/18/2023   CKD (chronic kidney disease) stage 3, GFR 30-59 ml/min (HCC) 04/26/2022   Hypothyroidism 04/26/2022   Positive occult stool blood test 06/03/2021   Anemia 06/03/2021   Diabetic retinopathy (HCC) 04/28/2020   Positive hepatitis C antibody test 12/03/2019   PCP: None  REFERRING PROVIDER: Dr. Ltanya Rummer  ONSET DATE: 04/12/23  REFERRING DIAG: O53.664Q (ICD-10-CM) - Displaced fracture of neck of fifth metacarpal bone, left hand, initial encounter for open fracture  THERAPY DIAG:  Pain in left hand  Stiffness of left hand, not elsewhere classified  Other symptoms and signs involving the musculoskeletal system  Rationale for Evaluation and Treatment: Rehabilitation  SUBJECTIVE:   SUBJECTIVE STATEMENT: S: It's so so.   PERTINENT HISTORY: Pt is a 53 y/o female who sustained a dog bite on 04/09/23 and underwent fifth metacarpal ORIF on 04/12/23. Due to nature of injury, dog  bite, unable to fixate with plates internally. External pins were removed, however bone shifted therefore pt was placed back in a cast for an additional 4 weeks. Pt's bone now showing early healing.   Left fifth metacarpal neck open fracture I&D Left fifth metacarpal neck open fracture ORIF Left hand adjacent tissue transfer <10 sq cm for primary closure of soft tissue defect due to dog bite 4 view radiographs of the left hand with intraoperative interpreted Eischen.  PRECAUTIONS: Other: [place]  Handbook   WEIGHT BEARING RESTRICTIONS: Yes NWB  PAIN:  Are you having pain? No  FALLS: Has patient fallen in last 6 months? No  PLOF: Independent  PATIENT GOALS: To be able to use her left hand.   NEXT MD VISIT: July 11th  OBJECTIVE:  Note: Objective measures were completed at Evaluation unless otherwise noted.  HAND DOMINANCE: Right  ADLs: Pt reports difficulty with using left hand during ADLs, currently in a splint and cannot lift or move objects. Pt cannot play the guitar right now. Pt cannot make a full grasp, has difficulty with incorporating LUE into grooming, dressing, bathing, etc.    FUNCTIONAL OUTCOME MEASURES: Quick Dash: 72.73 6/19: 34.09  UPPER EXTREMITY ROM:      Active ROM Left eval Left  5/23 Left 6/19  Thumb MCP (0-60) 42 42   Thumb IP (0-80) 62 76   Thumb Opposition to Small Finger -2.5cm  touching   Index MCP (0-90) 42  60 52  Index PIP (0-100)  88 90 100  Index DIP (0-70)  30 40 54  Long MCP (0-90)  42 59 46  Long PIP (0-100)  80 81 98  Long DIP (0-70)  26 48 52  Ring MCP (0-90)  30 48 50  Ring PIP (0-100)  36 88 88  Ring DIP (0-70)  18 40 58  Little MCP (0-90)  20 40 62  Little PIP (0-100)  40 80 80  Little DIP (0-70)  12 54 58  (Blank rows = not tested)  HAND FUNCTION: Grip strength: Right: 66 lbs; Left: 16 lbs, Lateral pinch: Right: 14 lbs, Left: 14 lbs, and 3 point pinch: Right: 12 lbs, Left: 8 lbs Grip Strength: Right: 60 lbs, Left: 18,  Lateral pinch: Right: 18 lbs, Left: 16 and 3 point pinch Right: 16 lbs  Left: 12 lbs 6/19: Grip Strength: Left: 35, Lateral pinch: Left: 13 and 3 point pinch Left: 12 lbs  COORDINATION: 9 Hole Peg test: Right: 20.42 sec; Left: 26.05 sec  SENSATION: Pt reports numbness in 4th and 5th digits from wrist to fingertips  EDEMA:   Left   Right Palm 21.5cm 20.25  MCPs 19.5cm 20.5cm   TREATMENT DATE:  09/08/23 -Sponges: 23, 23 -Pinch strengthening: pt using green clothespin and 3 point pinch to grasp and stack 4 towers of 5 sponges. Using lateral pinch to replace all 20 sponges in the bucket -Grip strengthening: large, medium beads with hand gripper at 37#, small beads with gripper at 35#, vertical gripper  09/05/23 -Towel crumple: 10 reps -Grip strengthening: large, medium beads with hand gripper at 37#, small beads with gripper at 35#, vertical gripper -Coin manipulation: pt holding coins in palm, translating to fingertips, placing into slotted container; 3 rounds, dropping coins intermittently -A/ROM: MCP flexion, PIP flexion, composite digit flexion/extension, 10 reps -Small pegboard: pt using tweezers to grasp and place pegs into pegboard in provided pattern. Min difficulty with tweezers and maintaining pinch. To remove: pt removing one at a time and holding as many as possible in the palm at one time. 2 rounds to remove.  08/29/23 -Sponges: 23, 23 -A/ROM: MCP flexion, PIP flexion, composite digit flexion/extension, 10 reps -Tendon glides: 10 reps -Small pegboard: Pt holding pegs in left palm, working to place according to provided pattern without dropping. Pt intermittently dropping pegs, mod difficulty closing fist to keep a tight grasp on the pegs. When removing, removed and kept in palm, was able to remove ~5 before dropping pegs.  -Grip strengthening: large,  medium, small beads with gripper at 35#, vertical gripper -Theraputty: red-flatten, cutting circles into putty using pvc pipe, pvc pipe pull, gripping, pinching, finding 4/5 beads in putty     PATIENT EDUCATION: Education details: reviewed HEP Person educated: Patient Education method: Programmer, multimedia, Demonstration, and Handouts Education comprehension: verbalized understanding and returned demonstration  HOME EXERCISE PROGRAM: Eval: finger A/ROM 08/04/23: flexion glove wear; theraputty grip and pinch-yellow 08/23/23: tendon glides  GOALS: Goals reviewed with patient? Yes  SHORT TERM GOALS: Target date: 08/18/23  Pt will be provided with and educated on HEP to improve mobility in the left hand required for use during ADLs as non-dominant.   Goal status:  MET  2.  Pt will reduce edema in the left hand to minimal amounts to improve mobility required for achieving a full fist.   Goal status:  MET  3.  Pt will register grip strength in the left hand at 10# and pinch strength at 4# or greater to improve ability to hold lightweight objects such as a toothbrush or hairbrush during grooming tasks.   Goal status:  MET    LONG TERM GOALS: Target date: 09/18/23  Pt will decrease pain in the left hand to 3/10 or less to improve ability to sleep for 2+ consecutive hours without waking due to pain.   Goal status:  MET  2.  Pt will increase A/ROM of the left digits by 10+ degrees to improve ability to make a fist required for grasping objects during ADLs.   Goal status:  MET  3.  Pt will increase grip strength to 30# and pinch strength to 8# or greater to improve ability to grasp and maintain hold on a guitar.   Goal status:  MET  4.  Pt will demonstrate ability to make 75% or greater fist to improve ability to use left hand as non-dominant during ADLs and household tasks.   Goal status:  MET     ASSESSMENT:  CLINICAL IMPRESSION: Reassessment completed this session, pt has met  all goals and is demonstrating significant improvement in ROM and strength of the left hand. Pt reports she is using the LUE during ADLs and work tasks, does have to use  compensatory strategies at times. Pt has multiple other issues going on with the left and right hand, discussed continuing therapy versus discharging with HEP. Pt is demonstrating consistent improvement in her ROM and is able to make a full fist, strength is still progressing. Pt is agreeable to discharging with HEP to save visits in case she needs to return for the other issues. Reviewed HEP with pt.    PERFORMANCE DEFICITS: in functional skills including ADLs, IADLs, coordination, dexterity, sensation, edema, ROM, strength, pain, fascial restrictions, and UE functional use      PLAN:  OT FREQUENCY: 2x/week  OT DURATION: 8 weeks  PLANNED INTERVENTIONS: 97168 OT Re-evaluation, 97535 self care/ADL training, 40981 therapeutic exercise, 97530 therapeutic activity, 97112 neuromuscular re-education, 97140 manual therapy, 97035 ultrasound, 97014 electrical stimulation unattended, patient/family education, and DME and/or AE instructions  CONSULTED AND AGREED WITH PLAN OF CARE: Patient  PLAN FOR NEXT SESSION: Discharge today   Lafonda Piety, OTR/L  [phone] 09/08/2023, 3:47 PM

## 2023-09-13 ENCOUNTER — Encounter (HOSPITAL_COMMUNITY): Admitting: Occupational Therapy

## 2023-09-15 ENCOUNTER — Encounter (HOSPITAL_COMMUNITY): Admitting: Occupational Therapy

## 2023-09-20 ENCOUNTER — Encounter: Admitting: Orthopedic Surgery

## 2023-09-28 DIAGNOSIS — S62337B Displaced fracture of neck of fifth metacarpal bone, left hand, initial encounter for open fracture: Secondary | ICD-10-CM | POA: Diagnosis not present

## 2023-09-28 DIAGNOSIS — R2231 Localized swelling, mass and lump, right upper limb: Secondary | ICD-10-CM | POA: Diagnosis not present

## 2023-09-28 DIAGNOSIS — M79641 Pain in right hand: Secondary | ICD-10-CM | POA: Diagnosis not present

## 2023-09-29 ENCOUNTER — Other Ambulatory Visit (HOSPITAL_COMMUNITY)
Admission: RE | Admit: 2023-09-29 | Discharge: 2023-09-29 | Disposition: A | Discharge status: Home / Self Care | Source: Ambulatory Visit | Attending: Orthopedic Surgery | Admitting: Orthopedic Surgery

## 2023-09-29 ENCOUNTER — Other Ambulatory Visit: Payer: Self-pay | Admitting: Orthopedic Surgery

## 2023-09-29 DIAGNOSIS — S62337B Displaced fracture of neck of fifth metacarpal bone, left hand, initial encounter for open fracture: Secondary | ICD-10-CM | POA: Insufficient documentation

## 2023-09-29 LAB — CBC WITH DIFFERENTIAL/PLATELET
Abs Immature Granulocytes: 0.03 K/uL (ref 0.00–0.07)
Basophils Absolute: 0.1 K/uL (ref 0.0–0.1)
Basophils Relative: 1 %
Eosinophils Absolute: 0.7 K/uL — ABNORMAL HIGH (ref 0.0–0.5)
Eosinophils Relative: 8 %
HCT: 35.1 % — ABNORMAL LOW (ref 36.0–46.0)
Hemoglobin: 11.8 g/dL — ABNORMAL LOW (ref 12.0–15.0)
Immature Granulocytes: 0 %
Lymphocytes Relative: 23 %
Lymphs Abs: 2.3 K/uL (ref 0.7–4.0)
MCH: 31.5 pg (ref 26.0–34.0)
MCHC: 33.6 g/dL (ref 30.0–36.0)
MCV: 93.6 fL (ref 80.0–100.0)
Monocytes Absolute: 0.8 K/uL (ref 0.1–1.0)
Monocytes Relative: 8 %
Neutro Abs: 5.8 K/uL (ref 1.7–7.7)
Neutrophils Relative %: 60 %
Platelets: 260 K/uL (ref 150–400)
RBC: 3.75 MIL/uL — ABNORMAL LOW (ref 3.87–5.11)
RDW: 13.2 % (ref 11.5–15.5)
WBC: 9.8 K/uL (ref 4.0–10.5)
nRBC: 0 % (ref 0.0–0.2)

## 2023-09-29 LAB — CBC
HCT: 34.7 % — ABNORMAL LOW (ref 36.0–46.0)
Hemoglobin: 11.7 g/dL — ABNORMAL LOW (ref 12.0–15.0)
MCH: 31.6 pg (ref 26.0–34.0)
MCHC: 33.7 g/dL (ref 30.0–36.0)
MCV: 93.8 fL (ref 80.0–100.0)
Platelets: 255 K/uL (ref 150–400)
RBC: 3.7 MIL/uL — ABNORMAL LOW (ref 3.87–5.11)
RDW: 13.2 % (ref 11.5–15.5)
WBC: 9.7 K/uL (ref 4.0–10.5)
nRBC: 0 % (ref 0.0–0.2)

## 2023-09-29 LAB — COMPREHENSIVE METABOLIC PANEL WITH GFR
ALT: 25 U/L (ref 0–44)
AST: 26 U/L (ref 15–41)
Albumin: 4.3 g/dL (ref 3.5–5.0)
Alkaline Phosphatase: 70 U/L (ref 38–126)
Anion gap: 11 (ref 5–15)
BUN: 25 mg/dL — ABNORMAL HIGH (ref 6–20)
CO2: 22 mmol/L (ref 22–32)
Calcium: 9.7 mg/dL (ref 8.9–10.3)
Chloride: 107 mmol/L (ref 98–111)
Creatinine, Ser: 1.62 mg/dL — ABNORMAL HIGH (ref 0.44–1.00)
GFR, Estimated: 38 mL/min — ABNORMAL LOW (ref >60)
Glucose, Bld: 112 mg/dL — ABNORMAL HIGH (ref 70–99)
Potassium: 3.9 mmol/L (ref 3.5–5.1)
Sodium: 140 mmol/L (ref 135–145)
Total Bilirubin: 0.6 mg/dL (ref 0.0–1.2)
Total Protein: 8 g/dL (ref 6.5–8.1)

## 2023-09-29 LAB — SEDIMENTATION RATE: Sed Rate: 45 mm/h — ABNORMAL HIGH (ref 0–30)

## 2023-09-29 LAB — TSH: TSH: 5.462 u[IU]/mL — ABNORMAL HIGH (ref 0.350–4.500)

## 2023-09-29 LAB — C-REACTIVE PROTEIN: CRP: 0.5 mg/dL (ref <1.0)

## 2023-09-29 LAB — PREALBUMIN: Prealbumin: 24 mg/dL (ref 18–38)

## 2023-09-30 LAB — PTH, INTACT AND CALCIUM
Calcium, Total (PTH): 9.3 mg/dL (ref 8.7–10.2)
PTH: 20 pg/mL (ref 15–65)

## 2023-09-30 LAB — CALCITRIOL (1,25 DI-OH VIT D): Vit D, 1,25-Dihydroxy: 26.1 pg/mL (ref 24.8–81.5)

## 2023-10-03 ENCOUNTER — Ambulatory Visit
Admission: RE | Admit: 2023-10-03 | Discharge: 2023-10-03 | Disposition: A | Discharge status: Home / Self Care | Source: Ambulatory Visit | Attending: Orthopedic Surgery | Admitting: Orthopedic Surgery

## 2023-10-03 DIAGNOSIS — S62337A Displaced fracture of neck of fifth metacarpal bone, left hand, initial encounter for closed fracture: Secondary | ICD-10-CM | POA: Diagnosis not present

## 2023-10-03 DIAGNOSIS — S62337B Displaced fracture of neck of fifth metacarpal bone, left hand, initial encounter for open fracture: Secondary | ICD-10-CM

## 2023-10-04 LAB — NICOTINE/COTININE METABOLITES
Cotinine: 25.4 ng/mL
Nicotine: <1 ng/mL

## 2023-10-21 DIAGNOSIS — S62337B Displaced fracture of neck of fifth metacarpal bone, left hand, initial encounter for open fracture: Secondary | ICD-10-CM | POA: Diagnosis not present

## 2023-10-21 DIAGNOSIS — R2231 Localized swelling, mass and lump, right upper limb: Secondary | ICD-10-CM | POA: Diagnosis not present

## 2023-10-25 NOTE — Telephone Encounter (Signed)
 ASA 2.   Half dose lantus  evening prior No metformin  day of Hold Ozempic  one week prior

## 2023-10-26 DIAGNOSIS — S62337B Displaced fracture of neck of fifth metacarpal bone, left hand, initial encounter for open fracture: Secondary | ICD-10-CM | POA: Diagnosis not present

## 2023-10-26 NOTE — Telephone Encounter (Signed)
 Noted. Will call patient with sept schedule for a morning appointment

## 2023-10-31 ENCOUNTER — Encounter: Payer: Self-pay | Admitting: Internal Medicine

## 2023-10-31 ENCOUNTER — Ambulatory Visit: Admitting: Internal Medicine

## 2023-10-31 VITALS — BP 119/73 | HR 81 | Ht 67.0 in | Wt 180.8 lb

## 2023-10-31 DIAGNOSIS — I10 Essential (primary) hypertension: Secondary | ICD-10-CM | POA: Diagnosis not present

## 2023-10-31 DIAGNOSIS — E039 Hypothyroidism, unspecified: Secondary | ICD-10-CM

## 2023-10-31 DIAGNOSIS — E782 Mixed hyperlipidemia: Secondary | ICD-10-CM

## 2023-10-31 DIAGNOSIS — Z794 Long term (current) use of insulin: Secondary | ICD-10-CM | POA: Diagnosis not present

## 2023-10-31 DIAGNOSIS — E11319 Type 2 diabetes mellitus with unspecified diabetic retinopathy without macular edema: Secondary | ICD-10-CM | POA: Diagnosis not present

## 2023-10-31 DIAGNOSIS — N1832 Chronic kidney disease, stage 3b: Secondary | ICD-10-CM | POA: Diagnosis not present

## 2023-10-31 DIAGNOSIS — E1140 Type 2 diabetes mellitus with diabetic neuropathy, unspecified: Secondary | ICD-10-CM

## 2023-10-31 DIAGNOSIS — Z23 Encounter for immunization: Secondary | ICD-10-CM | POA: Diagnosis not present

## 2023-10-31 MED ORDER — GABAPENTIN 300 MG PO CAPS: 300.0000 mg | ORAL_CAPSULE | Freq: Every day | ORAL | 1 refills | Status: DC

## 2023-10-31 MED ORDER — OZEMPIC (0.25 OR 0.5 MG/DOSE) 2 MG/3ML ~~LOC~~ SOPN: 0.5000 mg | PEN_INJECTOR | SUBCUTANEOUS | 3 refills | Status: DC

## 2023-10-31 MED ORDER — ONDANSETRON HCL 4 MG PO TABS: 4.0000 mg | ORAL_TABLET | Freq: Three times a day (TID) | ORAL | 1 refills | Status: DC | PRN

## 2023-10-31 NOTE — Assessment & Plan Note (Addendum)
 Lab Results  Component Value Date   HGBA1C 7.7 (H) 08/18/2023   Was uncontrolled due to diet noncompliance, but improved now On metformin  500 mg twice daily and Lantus  28 units nightly On Ozempic  0.25 mg - plan to increase dose as tolerated, advised to take 0.5 mg qw now - has intermittent nausea, Zofran  prescribed Advised to follow diabetic diet On statin F/u CMP and lipid panel Diabetic eye exam: Advised to follow up with Ophthalmology for diabetic eye exam, sees clinic in [place]

## 2023-10-31 NOTE — Progress Notes (Signed)
 Established Patient Office Visit  Subjective:  Patient ID: Sandra Parsons, female    DOB: 1970/10/03  Age: 53 y.o. MRN: [id number]  CC:  Chief Complaint  Patient presents with   Diabetes    Has been doin the lower strength of ozempic  due to side effects.    Hypertension   Medical Management of Chronic Issues    Would like to discuss gabapentin  strength change.     HPI Sandra Parsons is a 53 y.o. female with past medical history of HTN, type II DM, CKD, HLD, hypothyroidism and lumbar spondylosis who presents for f/u of her chronic medical conditions.  HTN: BP is well-controlled. Takes medications regularly. Patient denies headache, dizziness, chest pain, dyspnea or palpitations.   Type II DM: She is currently taking Ozempic  0.25 mg QW, metformin  500 mg twice daily and Lantus  28 U at bedtime.  She reports tolerating Ozempic  well, except mild nausea.  She reports improved glycemic profile recently. Her blood glucose averages are around 150s now.  She admits that she has improved her diet. She has lost about 14 lbs since the last visit.    Denies polyuria or polyphagia currently.  She has history of diabetic neuropathy, and takes gabapentin  100 mg twice daily currently.  She still has burning pain of bilateral feet.  Hypothyroidism: Her last TSH was elevated, after which her dose of levothyroxine  was increased to 100 mcg.  Her appetite and weight has been increasing in the last 6 months, but she attributes it to improvement in her dentition and noncompliance to low-carb diet.   HLD: She takes atorvastatin  20 mg QD.   CKD: Followed by nephrology.  She has a history of proteinuria as well.  Denies dysuria, hematuria, urinary hesitancy or resistance.   Past Medical History:  Diagnosis Date   CKD (chronic kidney disease)    Complication of anesthesia    Depression    Diabetes mellitus without complication (HCC)    Hepatitis B    per patient- diagnosed at age 65   Hepatitis C     Hyperlipidemia    Hypertension    Not currently on BP meds   Pinched nerve    PONV (postoperative nausea and vomiting)    Thyroid  disease     Past Surgical History:  Procedure Laterality Date   BACK SURGERY     x2   CARPAL TUNNEL RELEASE Right 12/08/2020   Procedure: CARPAL TUNNEL RELEASE;  Surgeon: Onesimo Oneil LABOR, MD;  Location: [facility];  Service: Orthopedics;  Laterality: Right;   CYSTECTOMY     L forearm   OPEN REDUCTION INTERNAL FIXATION (ORIF) METACARPAL Left 04/12/2023   Procedure: OPEN REDUCTION INTERNAL FIXATION (ORIF) METACARPAL;  Surgeon: Alyse Agent, MD;  Location: [facility];  Service: Orthopedics;  Laterality: Left;  block and mac   TRIGGER FINGER RELEASE Left 09/01/2020   Procedure: LEFT LONG FINGER  A-1 PULLEY RELEASE;  Surgeon: Onesimo Oneil LABOR, MD;  Location: [facility];  Service: Orthopedics;  Laterality: Left;  Left long finger trigger release   TRIGGER FINGER RELEASE Right 12/08/2020   Procedure: RELEASE TRIGGER FINGER/A-1 PULLEY;  Surgeon: Onesimo Oneil LABOR, MD;  Location: [facility];  Service: Orthopedics;  Laterality: Right;  Right index and long finger trigger release    Family History  Problem Relation Age of Onset   Cancer Mother        multiple myeloma   Diabetes Mother    Heart failure Father    Diabetes Maternal Grandmother  Colon cancer Neg Hx     Social History   Socioeconomic History   Marital status: Single    Spouse name: Not on file   Number of children: Not on file   Years of education: Not on file   Highest education level: Not on file  Occupational History   Not on file  Tobacco Use   Smoking status: Former    Current packs/day: 0.00    Average packs/day: 0.3 packs/day for 31.0 years (7.8 ttl pk-yrs)    Types: Cigarettes    Start date: 06/21/1987    Quit date: 06/21/2018    Years since quitting: 5.3   Smokeless tobacco: Never  Vaping Use   Vaping status: Never Used  Substance and Sexual Activity   Alcohol use: No   Drug use:  Yes    Types: Marijuana    Comment: once a day; history of methamphetamine and cocaine use with last use about 4 years ago-documented 12/03/2019   Sexual activity: Not on file  Other Topics Concern   Not on file  Social History Narrative   Not on file   Social Drivers of Health   Financial Resource Strain: Not on file  Food Insecurity: Not on file  Transportation Needs: Not on file  Physical Activity: Not on file  Stress: Not on file  Social Connections: Not on file  Intimate Partner Violence: Not on file    Outpatient Medications Prior to Visit  Medication Sig Dispense Refill   amLODipine  (NORVASC ) 10 MG tablet Take 1 tablet (10 mg total) by mouth daily. 90 tablet 3   atorvastatin  (LIPITOR) 20 MG tablet Take 1 tablet (20 mg total) by mouth daily. 90 tablet 1   carvedilol  (COREG ) 6.25 MG tablet Take 1 tablet (6.25 mg total) by mouth 2 (two) times daily with a meal. 180 tablet 1   hydrALAZINE  (APRESOLINE ) 25 MG tablet Take 1 tablet (25 mg total) by mouth 2 (two) times daily. 180 tablet 1   insulin glargine  (LANTUS  SOLOSTAR) 100 UNIT/ML Solostar Pen Inject 28 Units into the skin daily. 15 mL 2   levothyroxine  (SYNTHROID ) 100 MCG tablet Take 1 tablet (100 mcg total) by mouth daily. 90 tablet 1   metFORMIN  (GLUCOPHAGE ) 500 MG tablet Take 1 tablet (500 mg total) by mouth 2 (two) times daily with a meal. 180 tablet 1   Multiple Vitamin (MULTIVITAMIN) tablet Take 1 tablet by mouth daily.     No facility-administered medications prior to visit.    Allergies  Allergen Reactions   Prednisone  Other (See Comments)    Poor tolerance due to Diabetes make my sugar go up Other reaction(s): Other (see comments) Poor tolerance due to Diabetes make my sugar go up    ROS Review of Systems  Constitutional:  Negative for chills and fever.  HENT:  Negative for congestion, sinus pressure, sinus pain and sore throat.   Eyes:  Negative for pain and discharge.  Respiratory:  Negative for cough  and shortness of breath.   Cardiovascular:  Negative for chest pain and palpitations.  Gastrointestinal:  Negative for abdominal pain, diarrhea, nausea and vomiting.  Endocrine: Negative for polydipsia and polyuria.  Genitourinary:  Negative for dysuria and hematuria.  Musculoskeletal:  Negative for neck pain and neck stiffness.       Left hand pain  Skin:  Negative for rash.  Neurological:  Negative for dizziness and weakness.  Psychiatric/Behavioral:  Negative for agitation and behavioral problems.       Objective:  Physical Exam Vitals reviewed.  Constitutional:      General: She is not in acute distress.    Appearance: She is obese. She is not diaphoretic.  HENT:     Head: Normocephalic and atraumatic.     Nose: Nose normal.     Mouth/Throat:     Mouth: Mucous membranes are moist.  Eyes:     General: No scleral icterus.    Extraocular Movements: Extraocular movements intact.  Cardiovascular:     Rate and Rhythm: Normal rate and regular rhythm.     Heart sounds: Normal heart sounds. No murmur heard. Pulmonary:     Breath sounds: Normal breath sounds. No wheezing or rales.  Musculoskeletal:     Cervical back: Neck supple. No tenderness.     Right lower leg: No edema.     Left lower leg: No edema.  Skin:    General: Skin is warm.     Findings: No rash.     Comments: Scar over left hand - well-healing  Neurological:     General: No focal deficit present.     Mental Status: She is alert and oriented to person, place, and time.  Psychiatric:        Mood and Affect: Mood normal.        Behavior: Behavior normal.     BP 119/73   Pulse 81   Ht 5' 7 (1.702 m)   Wt 180 lb 12.8 oz (82 kg)   LMP 04/26/2016   SpO2 93%   BMI 28.32 kg/m  Wt Readings from Last 3 Encounters:  10/31/23 180 lb 12.8 oz (82 kg)  08/31/23 193 lb 0.6 oz (87.6 kg)  08/18/23 194 lb (88 kg)    Lab Results  Component Value Date   TSH 5.462 (H) 09/29/2023   Lab Results  Component Value  Date   WBC 9.0 11/01/2023   HGB 10.8 (L) 11/01/2023   HCT 31.3 (L) 11/01/2023   MCV 92.9 11/01/2023   PLT 239 11/01/2023   Lab Results  Component Value Date   NA 139 11/01/2023   K 4.1 11/01/2023   CO2 23 11/01/2023   GLUCOSE 112 (H) 11/01/2023   BUN 32 (H) 11/01/2023   CREATININE 1.81 (H) 11/01/2023   BILITOT 0.6 09/29/2023   ALKPHOS 70 09/29/2023   AST 26 09/29/2023   ALT 25 09/29/2023   PROT 8.0 09/29/2023   ALBUMIN 4.3 11/01/2023   CALCIUM  9.3 11/01/2023   ANIONGAP 9 11/01/2023   EGFR 56 (L) 08/18/2023   Lab Results  Component Value Date   CHOL 181 04/08/2023   Lab Results  Component Value Date   HDL 48 04/08/2023   Lab Results  Component Value Date   LDLCALC 104 (H) 04/08/2023   Lab Results  Component Value Date   TRIG 145 04/08/2023   Lab Results  Component Value Date   CHOLHDL 3.8 04/08/2023   Lab Results  Component Value Date   HGBA1C 7.7 (H) 08/18/2023      Assessment & Plan:   Problem List Items Addressed This Visit       Cardiovascular and Mediastinum   Essential hypertension - Primary   BP Readings from Last 1 Encounters:  10/31/23 119/73   Well-controlled with amlodipine  10 mg QD, Coreg  6.25 mg twice daily and hydralazine  25 mg twice daily Counseled for compliance with the medications Advised DASH diet and moderate exercise/walking, at least 150 mins/week      Relevant Orders   CMP14+EGFR  Endocrine   Hypothyroidism   Lab Results  Component Value Date   TSH 5.462 (H) 09/29/2023   Levothyroxine  dose was increased by previous PCP after checking TSH On levothyroxine  100 mcg QD currently Check TSH and free T4      Relevant Orders   TSH + free T4   Type 2 diabetes mellitus with ophthalmic complication (HCC)   Lab Results  Component Value Date   HGBA1C 7.7 (H) 08/18/2023   Was uncontrolled due to diet noncompliance, but improved now On metformin  500 mg twice daily and Lantus  28 units nightly On Ozempic  0.25 mg - plan  to increase dose as tolerated, advised to take 0.5 mg qw now - has intermittent nausea, Zofran  prescribed Advised to follow diabetic diet On statin F/u CMP and lipid panel Diabetic eye exam: Advised to follow up with Ophthalmology for diabetic eye exam, sees clinic in [place]       Relevant Medications   Semaglutide ,0.25 or 0.5MG /DOS, (OZEMPIC , 0.25 OR 0.5 MG/DOSE,) 2 MG/3ML SOPN   ondansetron  (ZOFRAN ) 4 MG tablet   gabapentin  (NEURONTIN ) 300 MG capsule   Type 2 diabetes mellitus with diabetic neuropathy, with long-term current use of insulin (HCC)   Uncontrolled with gabapentin  100 mg BID Increased dose to gabapentin  300 mg nightly for now, would avoid morning dose for now If persistent symptoms, will increase to 300 mg twice daily      Relevant Medications   Semaglutide ,0.25 or 0.5MG /DOS, (OZEMPIC , 0.25 OR 0.5 MG/DOSE,) 2 MG/3ML SOPN   Other Relevant Orders   Hemoglobin A1c     Genitourinary   CKD (chronic kidney disease) stage 3, GFR 30-59 ml/min (HCC)   Last BMP reviewed, GFR stays around 45 Advised to maintain adequate hydration Avoid nephrotoxic agents Followed by nephrology-Dr. Rachele      Relevant Orders   CMP14+EGFR   CBC with Differential/Platelet     Other   Mixed hyperlipidemia   Relevant Orders   Lipid Profile   Other Visit Diagnoses       Encounter for immunization       Relevant Orders   Pneumococcal conjugate vaccine 20-valent (Completed)       Meds ordered this encounter  Medications   Semaglutide ,0.25 or 0.5MG /DOS, (OZEMPIC , 0.25 OR 0.5 MG/DOSE,) 2 MG/3ML SOPN    Sig: Inject 0.5 mg into the skin every 7 (seven) days.    Dispense:  3 mL    Refill:  3   ondansetron  (ZOFRAN ) 4 MG tablet    Sig: Take 1 tablet (4 mg total) by mouth every 8 (eight) hours as needed for nausea or vomiting.    Dispense:  20 tablet    Refill:  1   gabapentin  (NEURONTIN ) 300 MG capsule    Sig: Take 1 capsule (300 mg total) by mouth at bedtime.    Dispense:  90  capsule    Refill:  1    Follow-up: Return in about 4 months (around 03/01/2024) for DM and neuropathy.    Suzzane MARLA Blanch, MD

## 2023-10-31 NOTE — Assessment & Plan Note (Signed)
 BP Readings from Last 1 Encounters:  10/31/23 119/73   Well-controlled with amlodipine  10 mg QD, Coreg  6.25 mg twice daily and hydralazine  25 mg twice daily Counseled for compliance with the medications Advised DASH diet and moderate exercise/walking, at least 150 mins/week

## 2023-10-31 NOTE — Assessment & Plan Note (Signed)
 Last BMP reviewed, GFR stays around 45 Advised to maintain adequate hydration Avoid nephrotoxic agents Followed by nephrology-Dr. Rachele

## 2023-10-31 NOTE — Patient Instructions (Addendum)
 Please start taking Ozempic  0.5 mg once weekly instead of 0.25 mg.  Please start taking Gabapentin  300 mg at bedtime.  Please continue to take medications as prescribed.  Please continue to follow low carb diet and perform moderate exercise/walking at least 150 mins/week.  Please get fasting blood tests done before the next visit.  GI contact number:  [phone]

## 2023-11-01 ENCOUNTER — Other Ambulatory Visit (HOSPITAL_COMMUNITY)
Admission: RE | Admit: 2023-11-01 | Discharge: 2023-11-01 | Disposition: A | Discharge status: Home / Self Care | Source: Ambulatory Visit | Attending: Nephrology | Admitting: Nephrology

## 2023-11-01 DIAGNOSIS — E211 Secondary hyperparathyroidism, not elsewhere classified: Secondary | ICD-10-CM | POA: Insufficient documentation

## 2023-11-01 DIAGNOSIS — R809 Proteinuria, unspecified: Secondary | ICD-10-CM | POA: Diagnosis not present

## 2023-11-01 DIAGNOSIS — N189 Chronic kidney disease, unspecified: Secondary | ICD-10-CM | POA: Insufficient documentation

## 2023-11-01 DIAGNOSIS — D649 Anemia, unspecified: Secondary | ICD-10-CM | POA: Diagnosis not present

## 2023-11-01 DIAGNOSIS — D631 Anemia in chronic kidney disease: Secondary | ICD-10-CM | POA: Insufficient documentation

## 2023-11-01 LAB — CBC
HCT: 31.3 % — ABNORMAL LOW (ref 36.0–46.0)
Hemoglobin: 10.8 g/dL — ABNORMAL LOW (ref 12.0–15.0)
MCH: 32 pg (ref 26.0–34.0)
MCHC: 34.5 g/dL (ref 30.0–36.0)
MCV: 92.9 fL (ref 80.0–100.0)
Platelets: 239 K/uL (ref 150–400)
RBC: 3.37 MIL/uL — ABNORMAL LOW (ref 3.87–5.11)
RDW: 13.2 % (ref 11.5–15.5)
WBC: 9 K/uL (ref 4.0–10.5)
nRBC: 0 % (ref 0.0–0.2)

## 2023-11-01 LAB — RENAL FUNCTION PANEL
Albumin: 4.3 g/dL (ref 3.5–5.0)
Anion gap: 9 (ref 5–15)
BUN: 32 mg/dL — ABNORMAL HIGH (ref 6–20)
CO2: 23 mmol/L (ref 22–32)
Calcium: 9.3 mg/dL (ref 8.9–10.3)
Chloride: 107 mmol/L (ref 98–111)
Creatinine, Ser: 1.81 mg/dL — ABNORMAL HIGH (ref 0.44–1.00)
GFR, Estimated: 33 mL/min — ABNORMAL LOW (ref >60)
Glucose, Bld: 112 mg/dL — ABNORMAL HIGH (ref 70–99)
Phosphorus: 3.4 mg/dL (ref 2.5–4.6)
Potassium: 4.1 mmol/L (ref 3.5–5.1)
Sodium: 139 mmol/L (ref 135–145)

## 2023-11-01 LAB — IRON AND TIBC
Iron: 63 ug/dL (ref 28–170)
Saturation Ratios: 18 % (ref 10.4–31.8)
TIBC: 354 ug/dL (ref 250–450)
UIBC: 291 ug/dL

## 2023-11-01 LAB — FERRITIN: Ferritin: 12 ng/mL (ref 11–307)

## 2023-11-01 LAB — PROTEIN / CREATININE RATIO, URINE
Creatinine, Urine: 466 mg/dL
Protein Creatinine Ratio: 0.15 mg/mg{creat} (ref 0.00–0.15)
Total Protein, Urine: 72 mg/dL

## 2023-11-02 ENCOUNTER — Encounter: Payer: Self-pay | Admitting: *Deleted

## 2023-11-02 ENCOUNTER — Other Ambulatory Visit: Payer: Self-pay | Admitting: *Deleted

## 2023-11-02 LAB — PARATHYROID HORMONE, INTACT (NO CA): PTH: 27 pg/mL (ref 15–65)

## 2023-11-02 MED ORDER — PEG 3350-KCL-NA BICARB-NACL 420 G PO SOLR: 4000.0000 mL | Freq: Once | ORAL | 0 refills | Status: AC

## 2023-11-02 NOTE — Telephone Encounter (Signed)
 Pt has been scheduled for 11/29/23. Instructions mailed and prep sent to pharmacy.

## 2023-11-03 ENCOUNTER — Encounter (INDEPENDENT_AMBULATORY_CARE_PROVIDER_SITE_OTHER): Payer: Self-pay | Admitting: *Deleted

## 2023-11-03 NOTE — Telephone Encounter (Signed)
 Referral completed, [facility] apt letter sent to PCP

## 2023-11-04 DIAGNOSIS — E1140 Type 2 diabetes mellitus with diabetic neuropathy, unspecified: Secondary | ICD-10-CM | POA: Insufficient documentation

## 2023-11-04 NOTE — Assessment & Plan Note (Signed)
 Uncontrolled with gabapentin  100 mg BID Increased dose to gabapentin  300 mg nightly for now, would avoid morning dose for now If persistent symptoms, will increase to 300 mg twice daily

## 2023-11-04 NOTE — Assessment & Plan Note (Signed)
 Lab Results  Component Value Date   TSH 5.462 (H) 09/29/2023   Levothyroxine  dose was increased by previous PCP after checking TSH On levothyroxine  100 mcg QD currently Check TSH and free T4

## 2023-11-07 ENCOUNTER — Other Ambulatory Visit (HOSPITAL_COMMUNITY): Payer: Self-pay | Admitting: Nephrology

## 2023-11-07 DIAGNOSIS — N1832 Chronic kidney disease, stage 3b: Secondary | ICD-10-CM | POA: Diagnosis not present

## 2023-11-07 DIAGNOSIS — I129 Hypertensive chronic kidney disease with stage 1 through stage 4 chronic kidney disease, or unspecified chronic kidney disease: Secondary | ICD-10-CM

## 2023-11-07 DIAGNOSIS — N1831 Chronic kidney disease, stage 3a: Secondary | ICD-10-CM

## 2023-11-07 DIAGNOSIS — D638 Anemia in other chronic diseases classified elsewhere: Secondary | ICD-10-CM

## 2023-11-07 DIAGNOSIS — E1122 Type 2 diabetes mellitus with diabetic chronic kidney disease: Secondary | ICD-10-CM | POA: Diagnosis not present

## 2023-11-07 DIAGNOSIS — E1129 Type 2 diabetes mellitus with other diabetic kidney complication: Secondary | ICD-10-CM | POA: Diagnosis not present

## 2023-11-10 ENCOUNTER — Ambulatory Visit (HOSPITAL_COMMUNITY)
Admission: RE | Admit: 2023-11-10 | Discharge: 2023-11-10 | Disposition: A | Discharge status: Home / Self Care | Source: Ambulatory Visit | Attending: Nephrology | Admitting: Nephrology

## 2023-11-10 DIAGNOSIS — N1831 Chronic kidney disease, stage 3a: Secondary | ICD-10-CM | POA: Diagnosis present

## 2023-11-10 DIAGNOSIS — I129 Hypertensive chronic kidney disease with stage 1 through stage 4 chronic kidney disease, or unspecified chronic kidney disease: Secondary | ICD-10-CM | POA: Diagnosis present

## 2023-11-10 DIAGNOSIS — N1832 Chronic kidney disease, stage 3b: Secondary | ICD-10-CM | POA: Diagnosis present

## 2023-11-10 DIAGNOSIS — D638 Anemia in other chronic diseases classified elsewhere: Secondary | ICD-10-CM | POA: Insufficient documentation

## 2023-11-10 DIAGNOSIS — N183 Chronic kidney disease, stage 3 unspecified: Secondary | ICD-10-CM | POA: Diagnosis not present

## 2023-11-14 ENCOUNTER — Other Ambulatory Visit (HOSPITAL_COMMUNITY)
Admission: RE | Admit: 2023-11-14 | Discharge: 2023-11-14 | Disposition: A | Discharge status: Home / Self Care | Source: Ambulatory Visit | Attending: Nephrology | Admitting: Nephrology

## 2023-11-14 DIAGNOSIS — E1129 Type 2 diabetes mellitus with other diabetic kidney complication: Secondary | ICD-10-CM | POA: Diagnosis present

## 2023-11-14 DIAGNOSIS — N179 Acute kidney failure, unspecified: Secondary | ICD-10-CM | POA: Diagnosis present

## 2023-11-14 DIAGNOSIS — N1832 Chronic kidney disease, stage 3b: Secondary | ICD-10-CM | POA: Insufficient documentation

## 2023-11-14 DIAGNOSIS — B1920 Unspecified viral hepatitis C without hepatic coma: Secondary | ICD-10-CM | POA: Insufficient documentation

## 2023-11-14 DIAGNOSIS — I129 Hypertensive chronic kidney disease with stage 1 through stage 4 chronic kidney disease, or unspecified chronic kidney disease: Secondary | ICD-10-CM | POA: Insufficient documentation

## 2023-11-14 LAB — URINALYSIS, W/ REFLEX TO CULTURE (INFECTION SUSPECTED)
Bacteria, UA: NONE SEEN
Bilirubin Urine: NEGATIVE
Glucose, UA: NEGATIVE mg/dL
Hgb urine dipstick: NEGATIVE
Ketones, ur: 5 mg/dL — AB
Leukocytes,Ua: NEGATIVE
Nitrite: NEGATIVE
Protein, ur: 100 mg/dL — AB
Specific Gravity, Urine: 1.025 (ref 1.005–1.030)
pH: 5 (ref 5.0–8.0)

## 2023-11-14 LAB — PROTEIN / CREATININE RATIO, URINE
Creatinine, Urine: 440 mg/dL
Protein Creatinine Ratio: 0.22 mg/mg{creat} — ABNORMAL HIGH (ref 0.00–0.15)
Total Protein, Urine: 97 mg/dL

## 2023-11-15 ENCOUNTER — Other Ambulatory Visit (HOSPITAL_COMMUNITY)
Admission: RE | Admit: 2023-11-15 | Discharge: 2023-11-15 | Disposition: A | Discharge status: Home / Self Care | Source: Ambulatory Visit | Attending: Nephrology | Admitting: Nephrology

## 2023-11-15 DIAGNOSIS — N189 Chronic kidney disease, unspecified: Secondary | ICD-10-CM | POA: Insufficient documentation

## 2023-11-15 LAB — RENAL FUNCTION PANEL
Albumin: 3.9 g/dL (ref 3.5–5.0)
Anion gap: 10 (ref 5–15)
BUN: 20 mg/dL (ref 6–20)
CO2: 23 mmol/L (ref 22–32)
Calcium: 9.4 mg/dL (ref 8.9–10.3)
Chloride: 108 mmol/L (ref 98–111)
Creatinine, Ser: 1.31 mg/dL — ABNORMAL HIGH (ref 0.44–1.00)
GFR, Estimated: 49 mL/min — ABNORMAL LOW (ref >60)
Glucose, Bld: 141 mg/dL — ABNORMAL HIGH (ref 70–99)
Phosphorus: 3.3 mg/dL (ref 2.5–4.6)
Potassium: 3.8 mmol/L (ref 3.5–5.1)
Sodium: 141 mmol/L (ref 135–145)

## 2023-11-15 LAB — C4 COMPLEMENT: Complement C4, Body Fluid: 25 mg/dL (ref 12–38)

## 2023-11-15 LAB — CBC
HCT: 31.9 % — ABNORMAL LOW (ref 36.0–46.0)
Hemoglobin: 10.6 g/dL — ABNORMAL LOW (ref 12.0–15.0)
MCH: 31.4 pg (ref 26.0–34.0)
MCHC: 33.2 g/dL (ref 30.0–36.0)
MCV: 94.4 fL (ref 80.0–100.0)
Platelets: 278 K/uL (ref 150–400)
RBC: 3.38 MIL/uL — ABNORMAL LOW (ref 3.87–5.11)
RDW: 13.1 % (ref 11.5–15.5)
WBC: 8.2 K/uL (ref 4.0–10.5)
nRBC: 0 % (ref 0.0–0.2)

## 2023-11-15 LAB — C3 COMPLEMENT: C3 Complement: 127 mg/dL (ref 82–167)

## 2023-11-15 LAB — ANA: Anti Nuclear Antibody (ANA): NEGATIVE

## 2023-11-16 LAB — COMPLEMENT, TOTAL: Compl, Total (CH50): 52 U/mL (ref >41)

## 2023-11-17 LAB — ANA: Anti Nuclear Antibody (ANA): NEGATIVE

## 2023-11-17 LAB — MISC LABCORP TEST (SEND OUT): Labcorp test code: 343144

## 2023-11-23 ENCOUNTER — Telehealth: Payer: Self-pay | Admitting: *Deleted

## 2023-11-23 NOTE — Telephone Encounter (Signed)
 Pt states she has a lot of family issues going on right now and can't do this month or next month. She wishes to be scheduled in Nov. Pt says she will give us  a call next month to get scheduled in November.

## 2023-11-23 NOTE — Telephone Encounter (Signed)
 Message Received: Today Sandra Parsons Gaylene, Roneka Gilpin L, LPN This patient LM that she needs to reschedule and asked that you call her today after 2:00 due to work.  I am placing in depot.

## 2023-11-24 LAB — ANCA PROFILE
Anti-MPO Antibodies: <0.2 U (ref 0.0–0.9)
Anti-PR3 Antibodies: <0.2 U (ref 0.0–0.9)
Atypical P-ANCA titer: <1:20 {titer}
C-ANCA: <1:20 {titer}
P-ANCA: <1:20 {titer}

## 2023-11-25 DIAGNOSIS — E1122 Type 2 diabetes mellitus with diabetic chronic kidney disease: Secondary | ICD-10-CM | POA: Diagnosis not present

## 2023-11-25 DIAGNOSIS — N1831 Chronic kidney disease, stage 3a: Secondary | ICD-10-CM | POA: Diagnosis not present

## 2023-11-25 DIAGNOSIS — I129 Hypertensive chronic kidney disease with stage 1 through stage 4 chronic kidney disease, or unspecified chronic kidney disease: Secondary | ICD-10-CM | POA: Diagnosis not present

## 2023-11-25 DIAGNOSIS — N179 Acute kidney failure, unspecified: Secondary | ICD-10-CM | POA: Diagnosis not present

## 2023-11-29 ENCOUNTER — Encounter (HOSPITAL_COMMUNITY): Admission: RE | Payer: Self-pay | Source: Home / Self Care

## 2023-11-29 ENCOUNTER — Ambulatory Visit (HOSPITAL_COMMUNITY): Admission: RE | Admit: 2023-11-29 | Source: Home / Self Care | Admitting: Internal Medicine

## 2023-11-29 SURGERY — COLONOSCOPY
Anesthesia: Choice

## 2023-12-12 DIAGNOSIS — S62337B Displaced fracture of neck of fifth metacarpal bone, left hand, initial encounter for open fracture: Secondary | ICD-10-CM | POA: Diagnosis not present

## 2023-12-22 LAB — OPHTHALMOLOGY REPORT-SCANNED

## 2023-12-23 DIAGNOSIS — H5213 Myopia, bilateral: Secondary | ICD-10-CM | POA: Diagnosis not present

## 2024-01-15 ENCOUNTER — Other Ambulatory Visit: Payer: Self-pay | Admitting: Internal Medicine

## 2024-01-15 DIAGNOSIS — Z794 Long term (current) use of insulin: Secondary | ICD-10-CM

## 2024-01-16 IMAGING — CT CT L SPINE W/O CM
4 of 5 series · 14 of 33 positions shown, 16 images · IV contrast (agent unspecified)
Comparison: None Available.

CLINICAL DATA: Low back pain, increased fracture risk

EXAM:
CT Lumbar Spine without contrast
TECHNIQUE: 
TECHNIQUE: Multiplanar CT images of the lumbar spine were
reconstructed from contemporary CT of the Abdomen and Pelvis.
RADIATION DOSE REDUCTION: This exam was performed according to the
departmental dose-optimization program which includes automated
exposure control, adjustment of the mA and/or kV according to
patient size and/or use of iterative reconstruction technique.
CONTRAST:  None

[Series 7: axial st · axial · 0.39mm/px · z∈[+627,+781]mm · 3 of 155 slices shown, 4 images]
[im 39/155  soft-tissue]
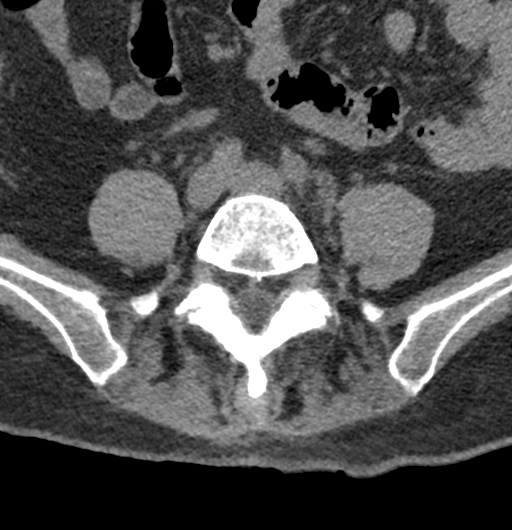
[im 39/155  bone]
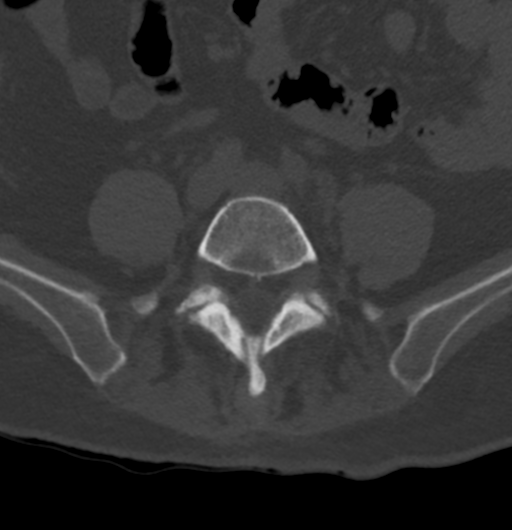
[im 78/155  bone]
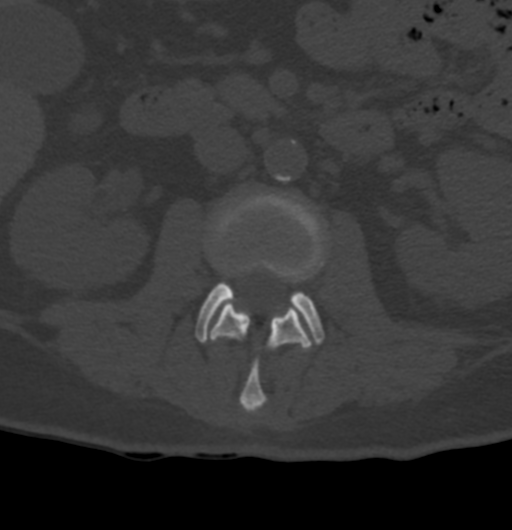
[im 116/155  bone]
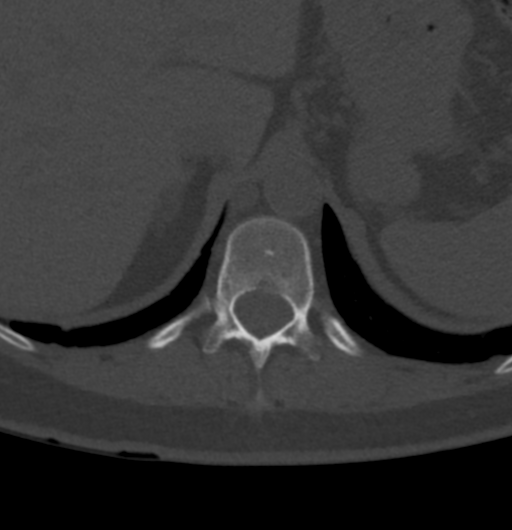

[Series 10: axial st orthog · axial · 0.33mm/px · z∈[+624,+766]mm · 3 of 144 slices shown]
[im 36/144  bone]
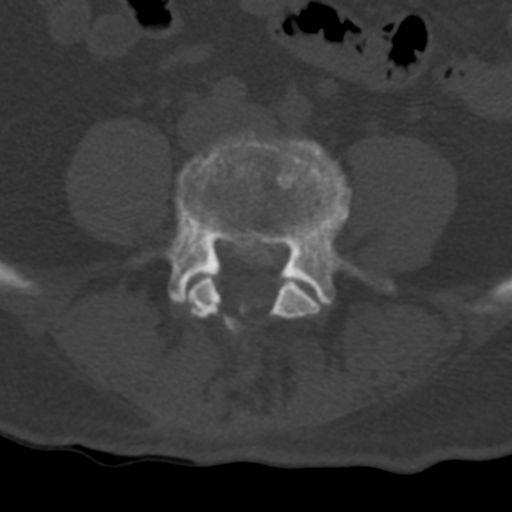
[im 72/144  bone]
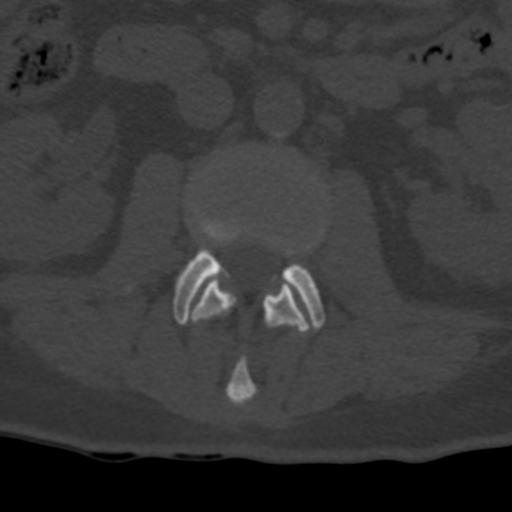
[im 108/144  bone]
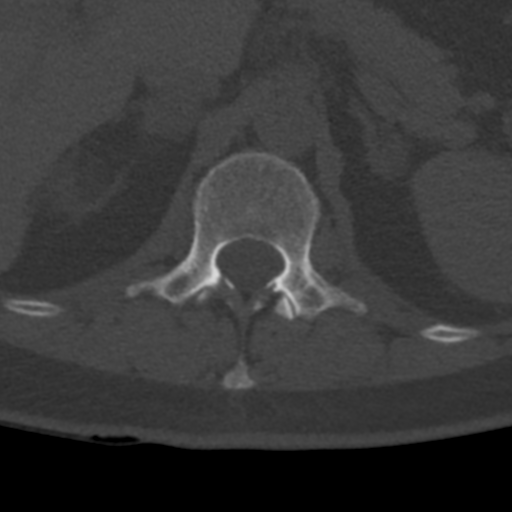

[Series 11: axial bone sag · sagittal · 0.38mm/px · 5 of 77 slices shown, 6 images]
[im 26/77  bone]
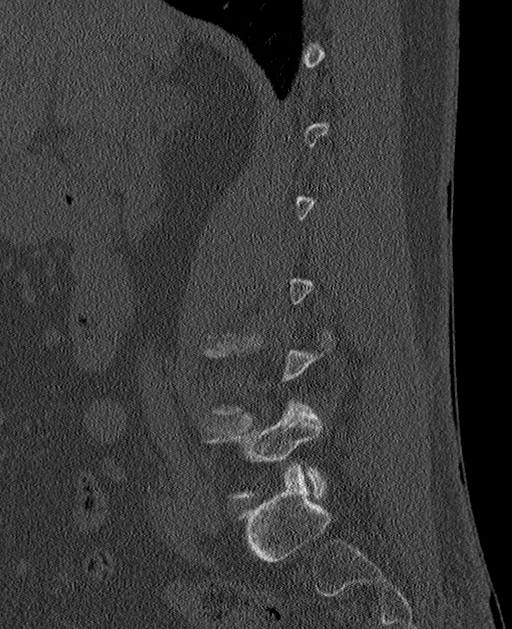
[im 32/77  bone]
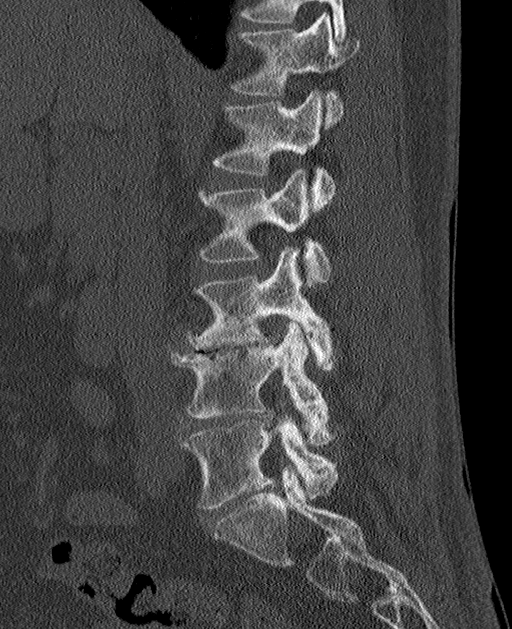
[im 39/77  soft-tissue]
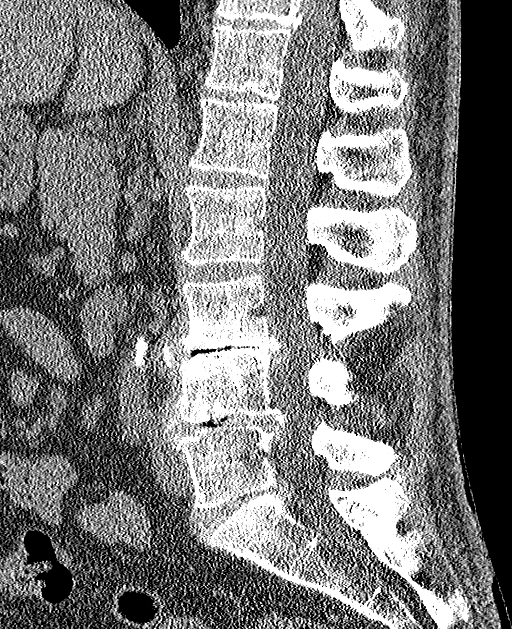
[im 39/77  bone]
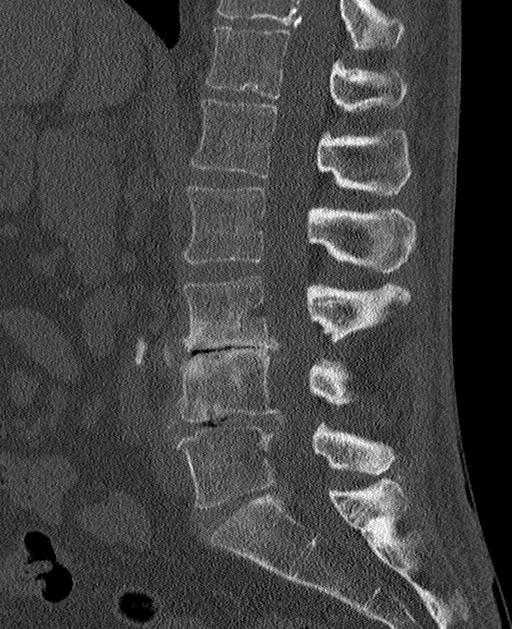
[im 45/77  bone]
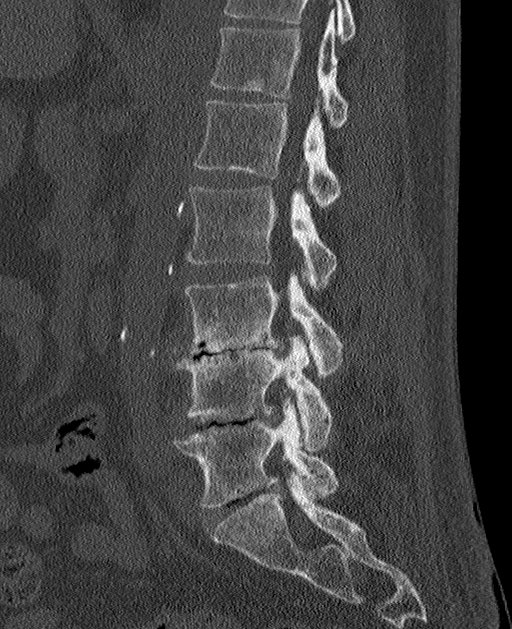
[im 51/77  bone]
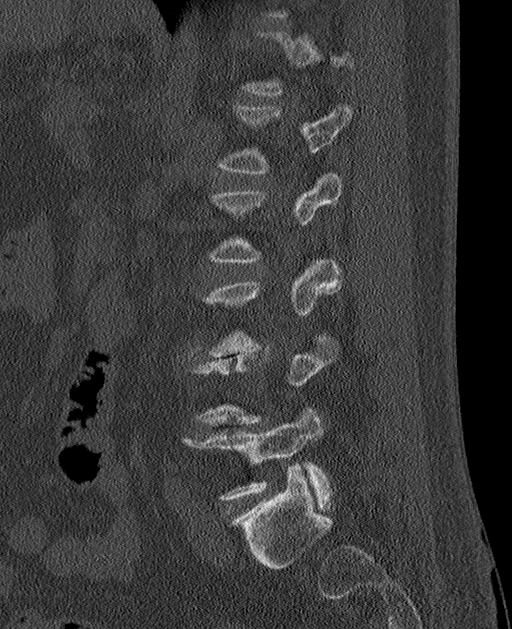

[Series 12: axial bone cor · coronal · 0.37mm/px · 3 of 99 slices shown]
[im 20/99  bone]
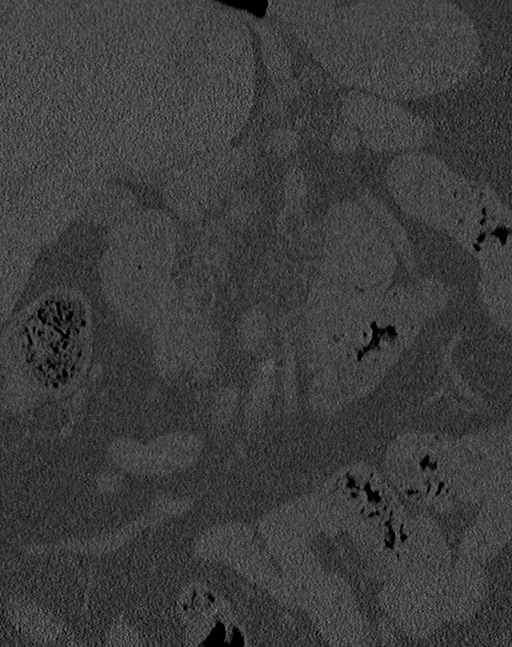
[im 40/99  bone]
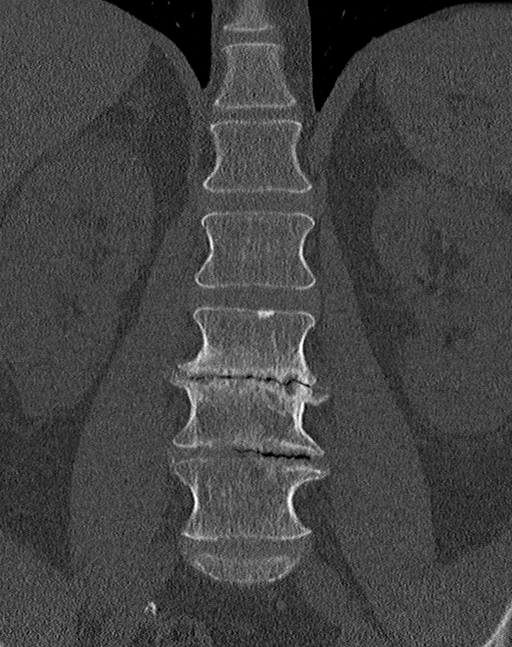
[im 59/99  bone]
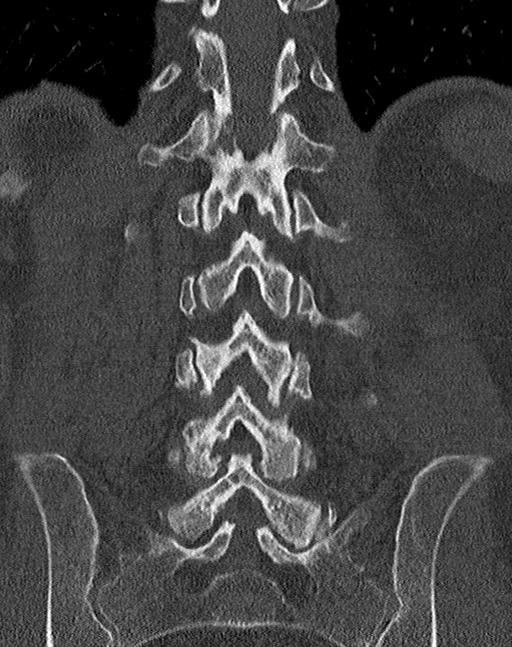

[14 of 33 positions shown; findings below may reference images not displayed]

FINDINGS: Segmentation: 5 lumbar type vertebrae.

Alignment: Mild retrolisthesis at L4-L5.

Vertebrae: Degenerative endplate irregularity at L3-L4, L4-L5, and
L5-S1. No acute fracture. No destructive osseous lesion.

Paraspinal and other soft tissues: 1.9 cm right adrenal nodule with
density consistent with an adenoma. Additional 1.5 cm right adrenal
nodule also demonstrates density consistent with an adenoma.

Disc levels:

L1-L2:  Facet hypertrophy.  No canal or foraminal stenosis.

L2-L3:  Facet hypertrophy.  No canal or foraminal stenosis.

L3-L4: Marked disc height loss. Disc bulge with endplate osteophytic
ridging. Facet hypertrophy and ligamentum flavum thickening. Mild
canal and foraminal stenosis. Narrowing of subarticular recesses.

L4-L5: Moderate disc height loss. Disc bulge with endplate
osteophytic ridging. Facet hypertrophy and ligamentum flavum
thickening. Mild canal and foraminal stenosis. Narrowing of the left
greater than right subarticular recesses.

L5-S1: Disc bulge with endplate osteophytic ridging. Facet
hypertrophy with ligamentum flavum thickening. No canal stenosis.
Mild foraminal stenosis.
IMPRESSION: No acute osseous abnormality.

Multilevel spondylosis is greatest at lower lumbar levels. No
high-grade stenosis identified.

## 2024-01-16 IMAGING — CT CT RENAL STONE PROTOCOL
2 of 4 series · 15 of 46 positions shown, 17 images · non-contrast
Comparison: Comparison made with the July 10, 2013.

CLINICAL DATA: A 50-year-old female presents for evaluation of
flank pain with suspected kidney stones.



[Series 2: axial st · axial · 0.84mm/px · z∈[+441,+861]mm · 12 of 97 slices shown, 14 images]
[im 7/97  soft-tissue]
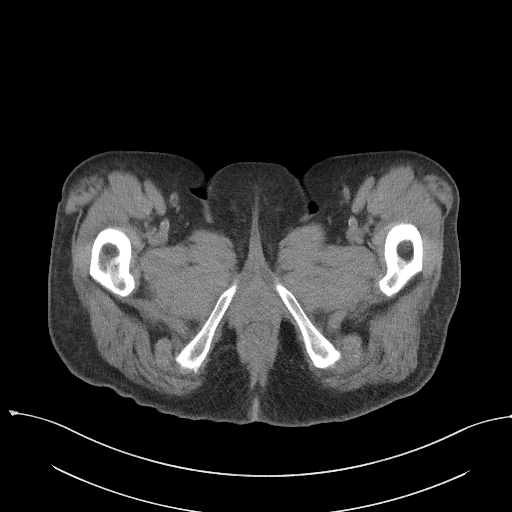
[im 7/97  bone]
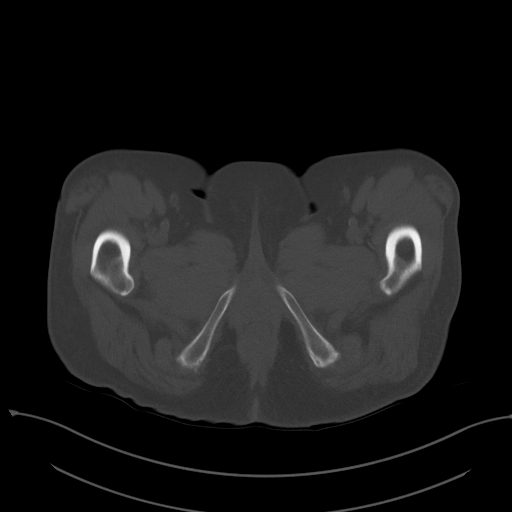
[im 13/97  soft-tissue]
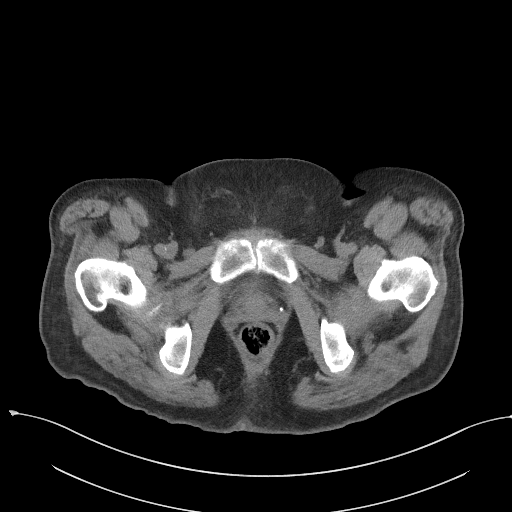
[im 25/97  soft-tissue]
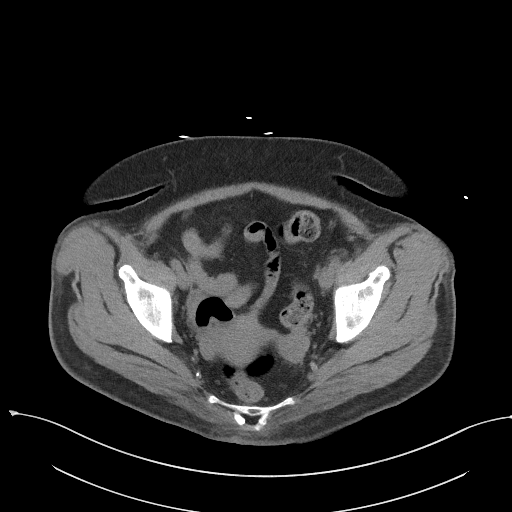
[im 31/97  soft-tissue]
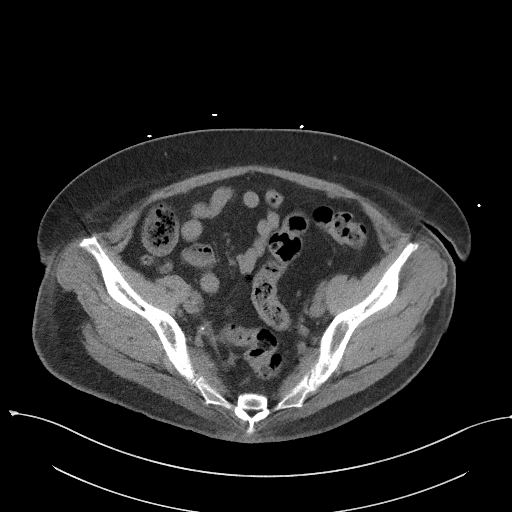
[im 37/97  soft-tissue]
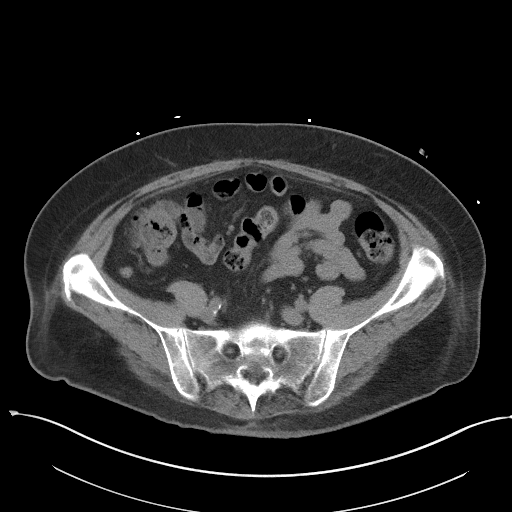
[im 43/97  soft-tissue]
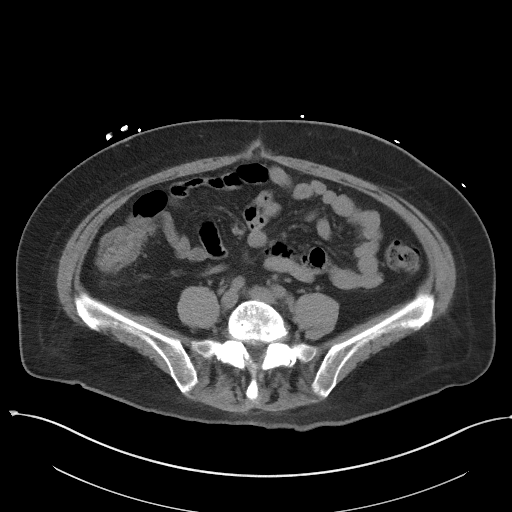
[im 55/97  soft-tissue]
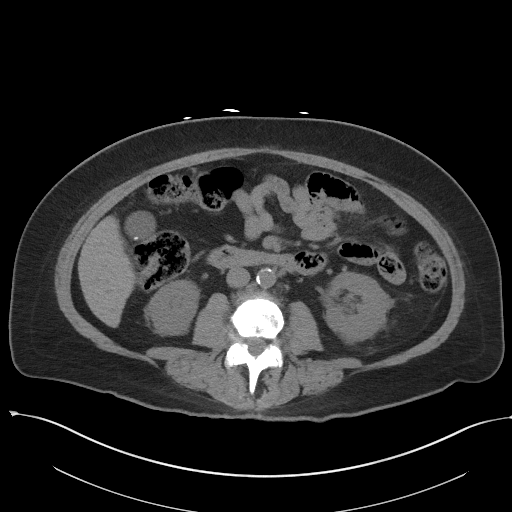
[im 61/97  soft-tissue]
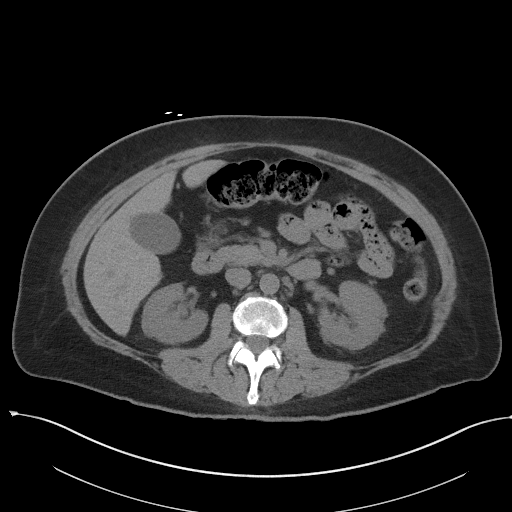
[im 67/97  soft-tissue]
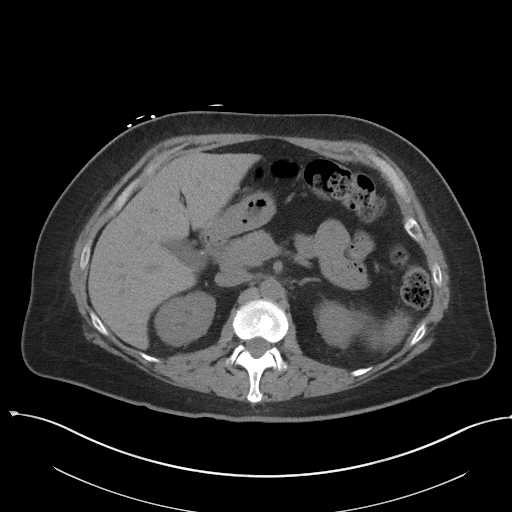
[im 67/97  bone]
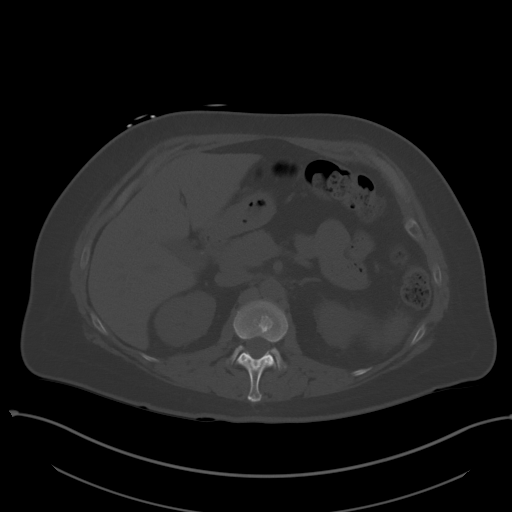
[im 73/97  soft-tissue]
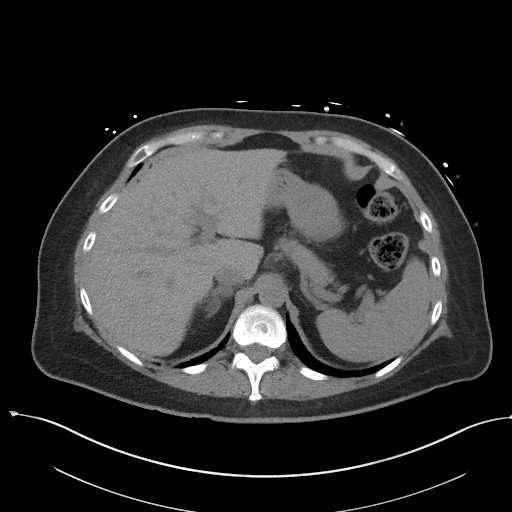
[im 85/97  soft-tissue]
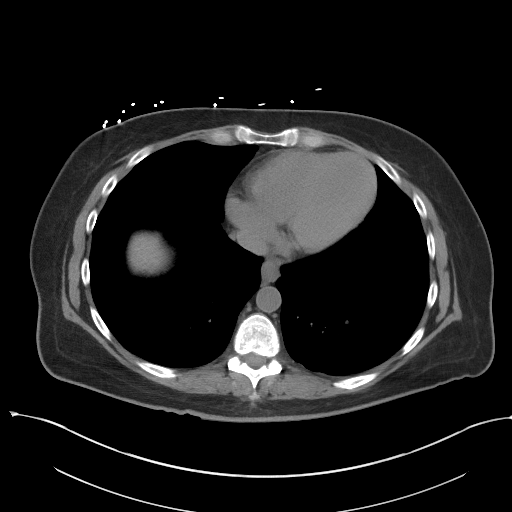
[im 91/97  soft-tissue]
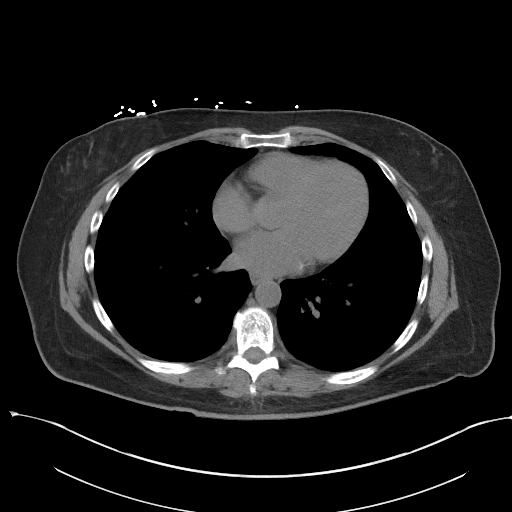

[Series 5: coronal st · coronal · 0.81mm/px · 3 of 95 slices shown]
[im 32/95  soft-tissue]
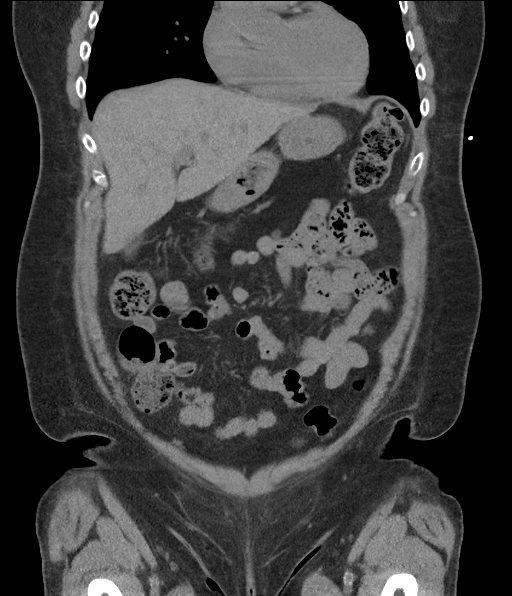
[im 42/95  soft-tissue]
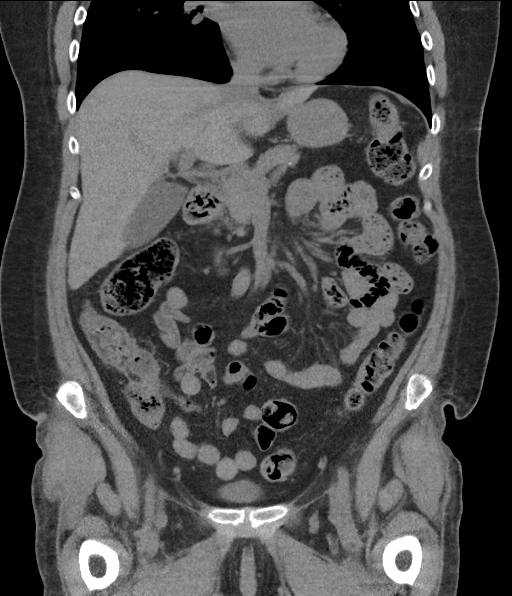
[im 53/95  soft-tissue]
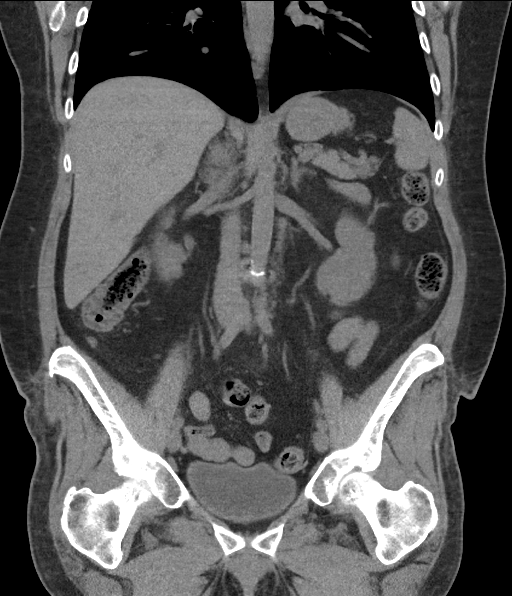

[15 of 46 positions shown; findings below may reference images not displayed]

FINDINGS: Lower chest: No sign of effusion or of consolidative changes at the
lung bases.

Hepatobiliary: Smooth hepatic contours. No visible hepatic lesion on
noncontrast imaging. Cholelithiasis without pericholecystic
stranding. No gross biliary duct distension.

Pancreas: No contour abnormality or signs of inflammation.

Spleen: Spleen normal size and contour.

Adrenals/Urinary Tract: Stable appearance of RIGHT adrenal
adenomata. Largest measuring 1.8 cm. Next largest in the body of the
adrenal measuring 1.4 cm. No follow-up recommended for these
findings in the absence of endocrine abnormalities.

Moderate perinephric stranding, improved when compared to more
remote imaging from 5756. No nephrolithiasis. No ureteral calculi.
No hydronephrosis. Smooth renal contours aside from perinephric
stranding seen on the current study.

Stomach/Bowel: Stomach is under distended limiting assessment. There
is question of thickening and or stranding about the midportion of
the stomach.

No signs of small bowel obstruction or inflammation about small
bowel. Normal appendix.

Colon without signs of adjacent inflammation.

Vascular/Lymphatic:

Aortic atherosclerosis. No sign of aneurysm. Smooth contour of the
IVC. There is no gastrohepatic or hepatoduodenal ligament
lymphadenopathy. No retroperitoneal or mesenteric lymphadenopathy.

No pelvic sidewall lymphadenopathy.

Limited assessment given lack of intravenous contrast with mild
moderate atherosclerotic changes.

Reproductive: Unremarkable by CT.

Other: Trace free pelvic fluid.  No pneumoperitoneum.

Musculoskeletal: Spinal degenerative changes greatest at L3-4, see
dedicated CT of the spine for further detail. No acute or
destructive bone process.
IMPRESSION: 1. No urinary tract calculi or obstruction. Perinephric stranding
which is improved over previous imaging. Given extent of stranding
would however suggest correlation with urine studies to exclude
ongoing urinary tract infection/pyelonephritis.
2. Perigastric stranding with suggestion of gastric wall thickening
in the mid stomach. Mild contour irregularity of the stomach is also
suggested though not well evaluated. Correlate with any symptoms
that would suggest gastritis. Given the potential for irregular
thickening, dedicated endoscopic assessment may be warranted.
3. Cholelithiasis without pericholecystic stranding.
4. Aortic Atherosclerosis.

Aortic Atherosclerosis (E80RH-007.7).

## 2024-02-02 ENCOUNTER — Emergency Department (HOSPITAL_COMMUNITY)
Admission: EM | Admit: 2024-02-02 | Discharge: 2024-02-02 | Disposition: A | Discharge status: Home / Self Care | Attending: Emergency Medicine | Admitting: Emergency Medicine

## 2024-02-02 ENCOUNTER — Other Ambulatory Visit: Payer: Self-pay

## 2024-02-02 ENCOUNTER — Emergency Department (HOSPITAL_COMMUNITY)

## 2024-02-02 DIAGNOSIS — K4090 Unilateral inguinal hernia, without obstruction or gangrene, not specified as recurrent: Secondary | ICD-10-CM | POA: Diagnosis not present

## 2024-02-02 DIAGNOSIS — K8020 Calculus of gallbladder without cholecystitis without obstruction: Secondary | ICD-10-CM | POA: Diagnosis not present

## 2024-02-02 DIAGNOSIS — K838 Other specified diseases of biliary tract: Secondary | ICD-10-CM | POA: Diagnosis not present

## 2024-02-02 DIAGNOSIS — R10A1 Flank pain, right side: Secondary | ICD-10-CM | POA: Insufficient documentation

## 2024-02-02 LAB — CBC
HCT: 29.8 % — ABNORMAL LOW (ref 36.0–46.0)
Hemoglobin: 10.1 g/dL — ABNORMAL LOW (ref 12.0–15.0)
MCH: 31.8 pg (ref 26.0–34.0)
MCHC: 33.9 g/dL (ref 30.0–36.0)
MCV: 93.7 fL (ref 80.0–100.0)
Platelets: 223 K/uL (ref 150–400)
RBC: 3.18 MIL/uL — ABNORMAL LOW (ref 3.87–5.11)
RDW: 12.6 % (ref 11.5–15.5)
WBC: 8.3 K/uL (ref 4.0–10.5)
nRBC: 0 % (ref 0.0–0.2)

## 2024-02-02 LAB — LIPASE, BLOOD: Lipase: 105 U/L — ABNORMAL HIGH (ref 11–51)

## 2024-02-02 LAB — HEPATIC FUNCTION PANEL
ALT: 34 U/L (ref 0–44)
AST: 33 U/L (ref 15–41)
Albumin: 4.1 g/dL (ref 3.5–5.0)
Alkaline Phosphatase: 88 U/L (ref 38–126)
Bilirubin, Direct: 0.1 mg/dL (ref 0.0–0.2)
Indirect Bilirubin: 0.1 mg/dL — ABNORMAL LOW (ref 0.3–0.9)
Total Bilirubin: 0.3 mg/dL (ref 0.0–1.2)
Total Protein: 6.9 g/dL (ref 6.5–8.1)

## 2024-02-02 LAB — URINALYSIS, ROUTINE W REFLEX MICROSCOPIC
Bacteria, UA: NONE SEEN
Bilirubin Urine: NEGATIVE
Glucose, UA: NEGATIVE mg/dL
Hgb urine dipstick: NEGATIVE
Ketones, ur: NEGATIVE mg/dL
Leukocytes,Ua: NEGATIVE
Nitrite: NEGATIVE
Protein, ur: 30 mg/dL — AB
Specific Gravity, Urine: 1.015 (ref 1.005–1.030)
pH: 5 (ref 5.0–8.0)

## 2024-02-02 LAB — BASIC METABOLIC PANEL WITH GFR
Anion gap: 11 (ref 5–15)
BUN: 26 mg/dL — ABNORMAL HIGH (ref 6–20)
CO2: 25 mmol/L (ref 22–32)
Calcium: 9.6 mg/dL (ref 8.9–10.3)
Chloride: 106 mmol/L (ref 98–111)
Creatinine, Ser: 1.3 mg/dL — ABNORMAL HIGH (ref 0.44–1.00)
GFR, Estimated: 50 mL/min — ABNORMAL LOW (ref >60)
Glucose, Bld: 133 mg/dL — ABNORMAL HIGH (ref 70–99)
Potassium: 3.4 mmol/L — ABNORMAL LOW (ref 3.5–5.1)
Sodium: 142 mmol/L (ref 135–145)

## 2024-02-02 MED ORDER — SODIUM CHLORIDE 0.9 % IV BOLUS
1000.0000 mL | Freq: Once | INTRAVENOUS | Status: AC
  Administered 2024-02-02: 1000 mL via INTRAVENOUS

## 2024-02-02 MED ORDER — ONDANSETRON HCL 4 MG PO TABS: 4.0000 mg | ORAL_TABLET | Freq: Four times a day (QID) | ORAL | 0 refills | Status: AC

## 2024-02-02 MED ORDER — MORPHINE SULFATE (PF) 4 MG/ML IV SOLN
4.0000 mg | Freq: Once | INTRAVENOUS | Status: AC
  Administered 2024-02-02: 4 mg via INTRAVENOUS
  Filled 2024-02-02: qty 1

## 2024-02-02 MED ORDER — ACETAMINOPHEN 500 MG PO TABS
1000.0000 mg | ORAL_TABLET | Freq: Once | ORAL | Status: AC
  Administered 2024-02-02: 1000 mg via ORAL
  Filled 2024-02-02: qty 2

## 2024-02-02 MED ORDER — ONDANSETRON HCL 4 MG/2ML IJ SOLN
4.0000 mg | Freq: Once | INTRAMUSCULAR | Status: AC
  Administered 2024-02-02: 4 mg via INTRAVENOUS
  Filled 2024-02-02: qty 2

## 2024-02-02 NOTE — ED Provider Notes (Signed)
 Handoff given from C. Rigney PA -  53 year old female who presents to the emergency department with a chief complaint of right sided abdominal and flank pain which has been ongoing for the past 2 to 3 days.  She has had decreased urine output but denies any dysuria or hematuria.  Her abdominal exam was notable for right sided abdominal tenderness and right flank pain.  All laboratory studies and imaging are pending at this time.  Physical Exam  BP 132/65   Pulse 80   Temp 97.8 F (36.6 C) (Temporal)   Resp 18   Wt 77.1 kg   LMP 04/26/2016   SpO2 98%   BMI 26.63 kg/m   Physical Exam Vitals and nursing note reviewed.  Constitutional:      General: She is not in acute distress.    Appearance: Normal appearance.  HENT:     Head: Normocephalic and atraumatic.  Eyes:     Extraocular Movements: Extraocular movements intact.     Conjunctiva/sclera: Conjunctivae normal.     Pupils: Pupils are equal, round, and reactive to light.  Cardiovascular:     Rate and Rhythm: Normal rate and regular rhythm.     Pulses: Normal pulses.  Pulmonary:     Effort: Pulmonary effort is normal. No respiratory distress.  Abdominal:     General: Abdomen is flat.     Palpations: Abdomen is soft.     Tenderness: There is abdominal tenderness. There is right CVA tenderness.  Musculoskeletal:        General: Normal range of motion.     Cervical back: Normal range of motion.  Skin:    General: Skin is warm and dry.     Capillary Refill: Capillary refill takes less than 2 seconds.  Neurological:     General: No focal deficit present.     Mental Status: She is alert. Mental status is at baseline.  Psychiatric:        Mood and Affect: Mood normal.     Procedures  Procedures  ED Course / MDM   Clinical Course as of 02/09/24 0652  Thu Feb 02, 2024  2032 Creatinine(!): 1.30 [ML]    Clinical Course User Index [ML] Willma Duwaine CROME, PA   Medical Decision Making Amount and/or Complexity of Data  Reviewed Labs: ordered. Radiology: ordered.  Risk OTC drugs. Prescription drug management.   53 year old female presenting with 2 to 3 days of right sided abdominal and flank pain and decreased urinary output.  She denies fever, nausea, vomiting, diarrhea, constipation, chest pain, shortness of breath, or recent trauma.  Past medical history is notable for chronic back pain. Initial evaluation included basic labs: CBC, BMP, urinalysis, lipase and CT imaging of the abdomen and pelvis, all which revealed no acute abnormalities.  Vital signs remained stable throughout the visit, and patient's pain was manageable with supportive care.  Given the benign workup, absence of concerning features, and stable clinical status, patient is appropriate for discharge home with prescription for ondansetron  and instructed to follow-up with her primary care provider.  She is provided return precautions for worsening pain, fever, vomiting, or urinary changes.       Willma Duwaine CROME, [place] 02/09/24 0701    Francesca Elsie CROME, MD 02/11/24 1754

## 2024-02-02 NOTE — ED Provider Notes (Signed)
 [place] EMERGENCY DEPARTMENT AT [facility] Provider Note   CSN: [id number] Arrival date & time: 02/02/24  1633     Patient presents with: Flank Pain   Sandra Parsons is a 53 y.o. female.   Patient is a 53 year old female who presents emergency department the chief complaint of right-sided abdominal and flank pain which has been ongoing for proxy the past 2 to 3 days.  Patient also notes that she has had decreased urine output.  She denies any dysuria or hematuria.  She denies any associated chest pain or shortness of breath.  She has had no associated nausea, vomiting, diarrhea or constipation.  She has had no recent falls or blunt abdominal wall or back trauma.  She does note that she has chronic back pain.  She has not taken anything for her symptoms as of yet.   Flank Pain       Prior to Admission medications   Medication Sig Start Date End Date Taking? Authorizing Provider  amLODipine  (NORVASC ) 10 MG tablet Take 1 tablet (10 mg total) by mouth daily. 08/18/23   Tobie Suzzane POUR, MD  atorvastatin  (LIPITOR) 20 MG tablet Take 1 tablet (20 mg total) by mouth daily. 08/18/23   Tobie Suzzane POUR, MD  carvedilol  (COREG ) 6.25 MG tablet Take 1 tablet (6.25 mg total) by mouth 2 (two) times daily with a meal. 08/18/23   Tobie Suzzane POUR, MD  gabapentin  (NEURONTIN ) 300 MG capsule Take 1 capsule (300 mg total) by mouth at bedtime. 10/31/23   Tobie Suzzane POUR, MD  hydrALAZINE  (APRESOLINE ) 25 MG tablet Take 1 tablet (25 mg total) by mouth 2 (two) times daily. 08/18/23   Tobie Suzzane POUR, MD  LANTUS  SOLOSTAR 100 UNIT/ML Solostar Pen INJECT 28 UNITS SUBCUTANEOUSLY ONCE DAILY 01/16/24   Patel, Rutwik K, MD  levothyroxine  (SYNTHROID ) 100 MCG tablet Take 1 tablet (100 mcg total) by mouth daily. 08/19/23   Tobie Suzzane POUR, MD  metFORMIN  (GLUCOPHAGE ) 500 MG tablet Take 1 tablet (500 mg total) by mouth 2 (two) times daily with a meal. 08/18/23   Tobie Suzzane POUR, MD  Multiple Vitamin (MULTIVITAMIN)  tablet Take 1 tablet by mouth daily.    [provider]  ondansetron  (ZOFRAN ) 4 MG tablet Take 1 tablet (4 mg total) by mouth every 8 (eight) hours as needed for nausea or vomiting. 10/31/23   Tobie Suzzane POUR, MD  Semaglutide ,0.25 or 0.5MG /DOS, (OZEMPIC , 0.25 OR 0.5 MG/DOSE,) 2 MG/3ML SOPN Inject 0.5 mg into the skin every 7 (seven) days. 10/31/23   Tobie Suzzane POUR, MD    Allergies: Prednisone     Review of Systems  Genitourinary:  Positive for flank pain.  All other systems reviewed and are negative.   Updated Vital Signs BP 132/65   Pulse 80   Temp 97.8 F (36.6 C) (Temporal)   Resp 18   Wt 77.1 kg   LMP 04/26/2016   SpO2 98%   BMI 26.63 kg/m   Physical Exam Vitals and nursing note reviewed.  Constitutional:      General: She is not in acute distress.    Appearance: Normal appearance. She is not ill-appearing.  HENT:     Head: Normocephalic and atraumatic.     Nose: Nose normal.     Mouth/Throat:     Mouth: Mucous membranes are moist.  Eyes:     Extraocular Movements: Extraocular movements intact.     Conjunctiva/sclera: Conjunctivae normal.     Pupils: Pupils are equal, round, and reactive  to light.  Cardiovascular:     Rate and Rhythm: Normal rate and regular rhythm.     Pulses: Normal pulses.     Heart sounds: Normal heart sounds. No murmur heard.    No gallop.  Pulmonary:     Effort: Pulmonary effort is normal. No respiratory distress.     Breath sounds: Normal breath sounds. No stridor. No wheezing, rhonchi or rales.  Abdominal:     General: Abdomen is flat. Bowel sounds are normal. There is no distension.     Palpations: Abdomen is soft.     Tenderness: There is right CVA tenderness. There is no left CVA tenderness or guarding.     Hernia: No hernia is present.     Comments: Tender to palpation of right side abdomen and right flank  Musculoskeletal:        General: Normal range of motion.     Cervical back: Normal range of motion and neck supple. No  rigidity or tenderness.  Skin:    General: Skin is warm and dry.  Neurological:     General: No focal deficit present.     Mental Status: She is alert and oriented to person, place, and time. Mental status is at baseline.  Psychiatric:        Mood and Affect: Mood normal.        Behavior: Behavior normal.        Thought Content: Thought content normal.        Judgment: Judgment normal.     (all labs ordered are listed, but only abnormal results are displayed) Labs Reviewed  URINALYSIS, ROUTINE W REFLEX MICROSCOPIC  BASIC METABOLIC PANEL WITH GFR  CBC  HEPATIC FUNCTION PANEL  LIPASE, BLOOD    EKG: None  Radiology: No results found.   Procedures   Medications Ordered in the ED  morphine (PF) 4 MG/ML injection 4 mg (has no administration in time range)  ondansetron  (ZOFRAN ) injection 4 mg (has no administration in time range)  acetaminophen  (TYLENOL ) tablet 1,000 mg (has no administration in time range)  sodium chloride  0.9 % bolus 1,000 mL (has no administration in time range)                                    Medical Decision Making Patient is doing well at this time and does remain stable.  Will obtain lab work, urinalysis, CT scan renal stone study for further evaluation of right side abdominal pain and flank pain.  All workup is still pending at this point.  Have provided pain medications in the emergency department.  Vital signs are stable and she has no indication for sepsis.  Will sign patient out to Megan Lloyd, PA-C for reevaluation and final dispo.  Amount and/or Complexity of Data Reviewed Labs: ordered. Radiology: ordered.  Risk OTC drugs. Prescription drug management.        Final diagnoses:  None    ED Discharge Orders     None          Daralene Lonni JONETTA DEVONNA 02/02/24 1900    Francesca Elsie CROME, MD 02/03/24 1328

## 2024-02-02 NOTE — ED Triage Notes (Signed)
 Right flank pain x 2 days radiates to lower abd pain. Decreased urine output.  Also complains of soreness in middle of back.

## 2024-02-02 NOTE — Discharge Instructions (Addendum)
 Thank you for visiting the Emergency Department today. It was a pleasure to be part of your healthcare team.  Your test results showed no acute findings.  As discussed, please follow-up with your primary care and gastroenterology for further evaluation and care. At home, rest, hydrate, and resume normal diet. It is important to watch for warning signs such as worsening pain, fever, trouble breathing, or chest pain. If any of these happen, return to the Emergency Department or call 911. Thank you for trusting us  with your health.

## 2024-02-24 ENCOUNTER — Other Ambulatory Visit (HOSPITAL_COMMUNITY)
Admission: RE | Admit: 2024-02-24 | Discharge: 2024-02-24 | Disposition: A | Discharge status: Home / Self Care | Source: Ambulatory Visit | Attending: Nephrology | Admitting: Nephrology

## 2024-02-24 ENCOUNTER — Other Ambulatory Visit (HOSPITAL_COMMUNITY)
Admission: RE | Admit: 2024-02-24 | Discharge: 2024-02-24 | Disposition: A | Discharge status: Home / Self Care | Source: Ambulatory Visit | Attending: Internal Medicine | Admitting: Internal Medicine

## 2024-02-24 DIAGNOSIS — D631 Anemia in chronic kidney disease: Secondary | ICD-10-CM | POA: Insufficient documentation

## 2024-02-24 DIAGNOSIS — N189 Chronic kidney disease, unspecified: Secondary | ICD-10-CM | POA: Insufficient documentation

## 2024-02-24 DIAGNOSIS — Z794 Long term (current) use of insulin: Secondary | ICD-10-CM | POA: Diagnosis not present

## 2024-02-24 DIAGNOSIS — E211 Secondary hyperparathyroidism, not elsewhere classified: Secondary | ICD-10-CM | POA: Insufficient documentation

## 2024-02-24 DIAGNOSIS — E1122 Type 2 diabetes mellitus with diabetic chronic kidney disease: Secondary | ICD-10-CM | POA: Insufficient documentation

## 2024-02-24 DIAGNOSIS — R809 Proteinuria, unspecified: Secondary | ICD-10-CM | POA: Insufficient documentation

## 2024-02-24 DIAGNOSIS — I129 Hypertensive chronic kidney disease with stage 1 through stage 4 chronic kidney disease, or unspecified chronic kidney disease: Secondary | ICD-10-CM | POA: Insufficient documentation

## 2024-02-24 DIAGNOSIS — I10 Essential (primary) hypertension: Secondary | ICD-10-CM | POA: Diagnosis present

## 2024-02-24 DIAGNOSIS — E11319 Type 2 diabetes mellitus with unspecified diabetic retinopathy without macular edema: Secondary | ICD-10-CM | POA: Diagnosis not present

## 2024-02-24 DIAGNOSIS — E039 Hypothyroidism, unspecified: Secondary | ICD-10-CM | POA: Diagnosis not present

## 2024-02-24 LAB — CBC WITH DIFFERENTIAL/PLATELET
Abs Immature Granulocytes: 0.01 K/uL (ref 0.00–0.07)
Basophils Absolute: 0.1 K/uL (ref 0.0–0.1)
Basophils Relative: 1 %
Eosinophils Absolute: 0.6 K/uL — ABNORMAL HIGH (ref 0.0–0.5)
Eosinophils Relative: 9 %
HCT: 30.6 % — ABNORMAL LOW (ref 36.0–46.0)
Hemoglobin: 10.5 g/dL — ABNORMAL LOW (ref 12.0–15.0)
Immature Granulocytes: 0 %
Lymphocytes Relative: 32 %
Lymphs Abs: 2.2 K/uL (ref 0.7–4.0)
MCH: 31.8 pg (ref 26.0–34.0)
MCHC: 34.3 g/dL (ref 30.0–36.0)
MCV: 92.7 fL (ref 80.0–100.0)
Monocytes Absolute: 0.7 K/uL (ref 0.1–1.0)
Monocytes Relative: 10 %
Neutro Abs: 3.4 K/uL (ref 1.7–7.7)
Neutrophils Relative %: 48 %
Platelets: 254 K/uL (ref 150–400)
RBC: 3.3 MIL/uL — ABNORMAL LOW (ref 3.87–5.11)
RDW: 12.8 % (ref 11.5–15.5)
WBC: 7 K/uL (ref 4.0–10.5)
nRBC: 0 % (ref 0.0–0.2)

## 2024-02-24 LAB — RENAL FUNCTION PANEL
Albumin: 4.4 g/dL (ref 3.5–5.0)
Anion gap: 14 (ref 5–15)
BUN: 25 mg/dL — ABNORMAL HIGH (ref 6–20)
CO2: 20 mmol/L — ABNORMAL LOW (ref 22–32)
Calcium: 9.3 mg/dL (ref 8.9–10.3)
Chloride: 107 mmol/L (ref 98–111)
Creatinine, Ser: 1.13 mg/dL — ABNORMAL HIGH (ref 0.44–1.00)
GFR, Estimated: 58 mL/min — ABNORMAL LOW (ref >60)
Glucose, Bld: 116 mg/dL — ABNORMAL HIGH (ref 70–99)
Phosphorus: 3.6 mg/dL (ref 2.5–4.6)
Potassium: 3.6 mmol/L (ref 3.5–5.1)
Sodium: 141 mmol/L (ref 135–145)

## 2024-02-24 LAB — LIPID PANEL
Cholesterol: 155 mg/dL (ref 0–200)
HDL: 44 mg/dL (ref >40)
LDL Cholesterol: 49 mg/dL (ref 0–99)
Total CHOL/HDL Ratio: 3.5 ratio
Triglycerides: 307 mg/dL — ABNORMAL HIGH (ref <150)
VLDL: 61 mg/dL — ABNORMAL HIGH (ref 0–40)

## 2024-02-24 LAB — COMPREHENSIVE METABOLIC PANEL WITH GFR
ALT: 30 U/L (ref 0–44)
AST: 27 U/L (ref 15–41)
Albumin: 4.4 g/dL (ref 3.5–5.0)
Alkaline Phosphatase: 89 U/L (ref 38–126)
Anion gap: 14 (ref 5–15)
BUN: 25 mg/dL — ABNORMAL HIGH (ref 6–20)
CO2: 20 mmol/L — ABNORMAL LOW (ref 22–32)
Calcium: 9.3 mg/dL (ref 8.9–10.3)
Chloride: 107 mmol/L (ref 98–111)
Creatinine, Ser: 1.11 mg/dL — ABNORMAL HIGH (ref 0.44–1.00)
GFR, Estimated: 60 mL/min — ABNORMAL LOW (ref >60)
Glucose, Bld: 114 mg/dL — ABNORMAL HIGH (ref 70–99)
Potassium: 3.6 mmol/L (ref 3.5–5.1)
Sodium: 141 mmol/L (ref 135–145)
Total Bilirubin: 0.3 mg/dL (ref 0.0–1.2)
Total Protein: 7.6 g/dL (ref 6.5–8.1)

## 2024-02-24 LAB — HEMOGLOBIN A1C
Hgb A1c MFr Bld: 6.1 % — ABNORMAL HIGH (ref 4.8–5.6)
Mean Plasma Glucose: 128.37 mg/dL

## 2024-02-24 LAB — TSH: TSH: 5.72 u[IU]/mL — ABNORMAL HIGH (ref 0.350–4.500)

## 2024-02-24 LAB — T4, FREE: Free T4: 0.74 ng/dL (ref 0.61–1.12)

## 2024-02-25 LAB — MICROALBUMIN / CREATININE URINE RATIO
Creatinine, Urine: 237.2 mg/dL
Microalb Creat Ratio: 150 mg/g{creat} — ABNORMAL HIGH (ref 0–29)
Microalb, Ur: 355.2 ug/mL — ABNORMAL HIGH

## 2024-02-29 ENCOUNTER — Other Ambulatory Visit: Payer: Self-pay | Admitting: Internal Medicine

## 2024-02-29 DIAGNOSIS — R801 Persistent proteinuria, unspecified: Secondary | ICD-10-CM | POA: Diagnosis not present

## 2024-02-29 DIAGNOSIS — E1122 Type 2 diabetes mellitus with diabetic chronic kidney disease: Secondary | ICD-10-CM | POA: Diagnosis not present

## 2024-02-29 DIAGNOSIS — N1831 Chronic kidney disease, stage 3a: Secondary | ICD-10-CM | POA: Diagnosis not present

## 2024-02-29 DIAGNOSIS — D638 Anemia in other chronic diseases classified elsewhere: Secondary | ICD-10-CM | POA: Diagnosis not present

## 2024-02-29 DIAGNOSIS — E11319 Type 2 diabetes mellitus with unspecified diabetic retinopathy without macular edema: Secondary | ICD-10-CM

## 2024-03-01 ENCOUNTER — Encounter: Payer: Self-pay | Admitting: Internal Medicine

## 2024-03-01 ENCOUNTER — Ambulatory Visit (INDEPENDENT_AMBULATORY_CARE_PROVIDER_SITE_OTHER): Admitting: Internal Medicine

## 2024-03-01 VITALS — BP 152/72 | HR 81 | Ht 67.0 in | Wt 167.0 lb

## 2024-03-01 DIAGNOSIS — Z1231 Encounter for screening mammogram for malignant neoplasm of breast: Secondary | ICD-10-CM | POA: Diagnosis not present

## 2024-03-01 DIAGNOSIS — I10 Essential (primary) hypertension: Secondary | ICD-10-CM | POA: Diagnosis not present

## 2024-03-01 DIAGNOSIS — Z794 Long term (current) use of insulin: Secondary | ICD-10-CM | POA: Diagnosis not present

## 2024-03-01 DIAGNOSIS — Z23 Encounter for immunization: Secondary | ICD-10-CM | POA: Diagnosis not present

## 2024-03-01 DIAGNOSIS — E11319 Type 2 diabetes mellitus with unspecified diabetic retinopathy without macular edema: Secondary | ICD-10-CM

## 2024-03-01 DIAGNOSIS — E782 Mixed hyperlipidemia: Secondary | ICD-10-CM

## 2024-03-01 DIAGNOSIS — N1831 Chronic kidney disease, stage 3a: Secondary | ICD-10-CM | POA: Diagnosis not present

## 2024-03-01 DIAGNOSIS — E039 Hypothyroidism, unspecified: Secondary | ICD-10-CM

## 2024-03-01 MED ORDER — OLMESARTAN MEDOXOMIL 20 MG PO TABS: 10.0000 mg | ORAL_TABLET | Freq: Every day | ORAL | 0 refills | Status: AC

## 2024-03-01 MED ORDER — OZEMPIC (0.25 OR 0.5 MG/DOSE) 2 MG/3ML ~~LOC~~ SOPN: 0.5000 mg | PEN_INJECTOR | SUBCUTANEOUS | 3 refills | Status: DC

## 2024-03-01 MED ORDER — HYDRALAZINE HCL 25 MG PO TABS: 25.0000 mg | ORAL_TABLET | Freq: Two times a day (BID) | ORAL | 1 refills | Status: AC

## 2024-03-01 MED ORDER — LANTUS SOLOSTAR 100 UNIT/ML ~~LOC~~ SOPN: 28.0000 [IU] | PEN_INJECTOR | Freq: Every day | SUBCUTANEOUS | 2 refills | Status: AC

## 2024-03-01 MED ORDER — GABAPENTIN 300 MG PO CAPS: 300.0000 mg | ORAL_CAPSULE | Freq: Every day | ORAL | 1 refills | Status: AC

## 2024-03-01 NOTE — Assessment & Plan Note (Addendum)
 Lab Results  Component Value Date   TSH 5.720 (H) 02/24/2024   On levothyroxine  100 mcg QD currently, increased to 112 mcg QD now Check TSH and free T4 after 3 months

## 2024-03-01 NOTE — Assessment & Plan Note (Addendum)
 BP Readings from Last 1 Encounters:  03/01/24 (!) 152/72   Uncontrolled with amlodipine  10 mg QD and hydralazine  25 mg twice daily Has run out of Coreg  6.25 mg twice daily, discontinued for now Considering her proteinuria, started olmesartan 10 mg QD Counseled for compliance with the medications Advised DASH diet and moderate exercise/walking, at least 150 mins/week

## 2024-03-01 NOTE — Assessment & Plan Note (Addendum)
 Lab Results  Component Value Date   HGBA1C 6.1 (H) 02/24/2024   Was uncontrolled due to diet noncompliance, but improved now On metformin  500 mg twice daily and Lantus  28 units nightly On Ozempic  0.5 mg - plan to increase dose as tolerated - has intermittent nausea, Zofran  prescribed Advised to follow diabetic diet On statin F/u CMP and lipid panel Diabetic eye exam: Advised to follow up with Ophthalmology for diabetic eye exam, sees clinic in [place]

## 2024-03-01 NOTE — Patient Instructions (Addendum)
 Please schedule Mammogram.  Please start taking Olmesartan half tablet once daily.  Please start taking Levothyroxine  112 mcg once daily.  Please continue to take other medications as prescribed.  Please continue to follow low carb diet and perform moderate exercise/walking at least 150 mins/week.  Please get fasting blood tests done before the next visit.

## 2024-03-01 NOTE — Assessment & Plan Note (Signed)
-   On atorvastatin 20mg  QD

## 2024-03-01 NOTE — Progress Notes (Signed)
 Established Patient Office Visit  Subjective:  Patient ID: Sandra Parsons, female    DOB: 22-May-1970  Age: 53 y.o. MRN: [id number]  CC:  Chief Complaint  Patient presents with   Diabetes    4 month f/u    Peripheral Neuropathy    4 Month f/u     HPI Sandra Parsons is a 53 y.o. female with past medical history of HTN, type II DM, CKD, HLD, hypothyroidism and lumbar spondylosis who presents for f/u of her chronic medical conditions.  HTN: BP is elevated today. Takes amlodipine  10 mg QD and hydralazine  25 mg BID regularly, but has run out of Coreg . Patient denies headache, dizziness, chest pain, dyspnea or palpitations.  Type II DM: Her HbA1c has improved to 6.1 now.  She is currently taking Ozempic  0.5 mg QW, metformin  500 mg twice daily and Lantus  28 U at bedtime.  She reports tolerating Ozempic  well, except mild nausea, but prefers to avoid increasing dose.  She reports improved glycemic profile recently. Her blood glucose averages are around 120s now.  She admits that she has improved her diet. She has lost about 13 lbs since the last visit, total 27 lbs since starting Ozempic .    Denies polyuria or polyphagia currently.  She has history of diabetic neuropathy, and takes gabapentin  300 mg qHS currently.  She still has burning pain of bilateral feet.  Hypothyroidism: Her TSH is still elevated. She takes levothyroxine  100 mcg QD.  Denies any recent change in appetite, hair or skin changes.   HLD: She takes atorvastatin  20 mg QD.   CKD: Followed by nephrology.  She has a history of proteinuria as well.  Denies dysuria, hematuria, urinary hesitancy or resistance.   Past Medical History:  Diagnosis Date   CKD (chronic kidney disease)    Complication of anesthesia    Depression    Diabetes mellitus without complication (HCC)    Hepatitis B    per patient- diagnosed at age 71   Hepatitis C    Hyperlipidemia    Hypertension    Not currently on BP meds   Pinched nerve    PONV  (postoperative nausea and vomiting)    Thyroid  disease     Past Surgical History:  Procedure Laterality Date   BACK SURGERY     x2   CARPAL TUNNEL RELEASE Right 12/08/2020   Procedure: CARPAL TUNNEL RELEASE;  Surgeon: Onesimo Oneil LABOR, MD;  Location: [facility];  Service: Orthopedics;  Laterality: Right;   CYSTECTOMY     L forearm   OPEN REDUCTION INTERNAL FIXATION (ORIF) METACARPAL Left 04/12/2023   Procedure: OPEN REDUCTION INTERNAL FIXATION (ORIF) METACARPAL;  Surgeon: Alyse Agent, MD;  Location: [facility];  Service: Orthopedics;  Laterality: Left;  block and mac   TRIGGER FINGER RELEASE Left 09/01/2020   Procedure: LEFT LONG FINGER  A-1 PULLEY RELEASE;  Surgeon: Onesimo Oneil LABOR, MD;  Location: [facility];  Service: Orthopedics;  Laterality: Left;  Left long finger trigger release   TRIGGER FINGER RELEASE Right 12/08/2020   Procedure: RELEASE TRIGGER FINGER/A-1 PULLEY;  Surgeon: Onesimo Oneil LABOR, MD;  Location: [facility];  Service: Orthopedics;  Laterality: Right;  Right index and long finger trigger release    Family History  Problem Relation Age of Onset   Cancer Mother        multiple myeloma   Diabetes Mother    Heart failure Father    Diabetes Maternal Grandmother    Colon cancer Neg Hx  Social History   Socioeconomic History   Marital status: Single    Spouse name: Not on file   Number of children: Not on file   Years of education: Not on file   Highest education level: Not on file  Occupational History   Not on file  Tobacco Use   Smoking status: Former    Current packs/day: 0.00    Average packs/day: 0.3 packs/day for 31.0 years (7.8 ttl pk-yrs)    Types: Cigarettes    Start date: 06/21/1987    Quit date: 06/21/2018    Years since quitting: 5.6   Smokeless tobacco: Never  Vaping Use   Vaping status: Never Used  Substance and Sexual Activity   Alcohol use: No   Drug use: Yes    Types: Marijuana    Comment: once a day; history of methamphetamine and  cocaine use with last use about 4 years ago-documented 12/03/2019   Sexual activity: Not on file  Other Topics Concern   Not on file  Social History Narrative   Not on file   Social Drivers of Health   Tobacco Use: Medium Risk (03/01/2024)   Patient History    Smoking Tobacco Use: Former    Smokeless Tobacco Use: Never    Passive Exposure: Not on Actuary Strain: Not on file  Food Insecurity: Not on file  Transportation Needs: Not on file  Physical Activity: Not on file  Stress: Not on file  Social Connections: Not on file  Intimate Partner Violence: Not on file  Depression (PHQ2-9): Low Risk (03/01/2024)   Depression (PHQ2-9)    PHQ-2 Score: 0  Alcohol Screen: Not on file  Housing: Not on file  Utilities: Not on file  Health Literacy: Not on file    Outpatient Medications Prior to Visit  Medication Sig Dispense Refill   amLODipine  (NORVASC ) 10 MG tablet Take 1 tablet (10 mg total) by mouth daily. 90 tablet 3   Multiple Vitamin (MULTIVITAMIN) tablet Take 1 tablet by mouth daily.     ondansetron  (ZOFRAN ) 4 MG tablet Take 1 tablet (4 mg total) by mouth every 6 (six) hours. 12 tablet 0   atorvastatin  (LIPITOR) 20 MG tablet Take 1 tablet (20 mg total) by mouth daily. 90 tablet 1   carvedilol  (COREG ) 6.25 MG tablet Take 1 tablet (6.25 mg total) by mouth 2 (two) times daily with a meal. 180 tablet 1   gabapentin  (NEURONTIN ) 300 MG capsule Take 1 capsule (300 mg total) by mouth at bedtime. 90 capsule 1   hydrALAZINE  (APRESOLINE ) 25 MG tablet Take 1 tablet (25 mg total) by mouth 2 (two) times daily. 180 tablet 1   LANTUS  SOLOSTAR 100 UNIT/ML Solostar Pen INJECT 28 UNITS SUBCUTANEOUSLY ONCE DAILY 15 mL 0   levothyroxine  (SYNTHROID ) 100 MCG tablet Take 1 tablet (100 mcg total) by mouth daily. 90 tablet 1   metFORMIN  (GLUCOPHAGE ) 500 MG tablet Take 1 tablet (500 mg total) by mouth 2 (two) times daily with a meal. 180 tablet 1   Semaglutide ,0.25 or 0.5MG /DOS, (OZEMPIC ,  0.25 OR 0.5 MG/DOSE,) 2 MG/3ML SOPN Inject 0.5 mg into the skin every 7 (seven) days. 3 mL 3   No facility-administered medications prior to visit.    Allergies  Allergen Reactions   Farxiga [Dapagliflozin] Other (See Comments)    Vulvovaginitis   Prednisone  Other (See Comments)    Poor tolerance due to Diabetes make my sugar go up Other reaction(s): Other (see comments) Poor tolerance due to Diabetes  make my sugar go up    ROS Review of Systems  Constitutional:  Negative for chills and fever.  HENT:  Negative for congestion, sinus pressure, sinus pain and sore throat.   Eyes:  Negative for pain and discharge.  Respiratory:  Negative for cough and shortness of breath.   Cardiovascular:  Negative for chest pain and palpitations.  Gastrointestinal:  Negative for abdominal pain, diarrhea, nausea and vomiting.  Endocrine: Negative for polydipsia and polyuria.  Genitourinary:  Negative for dysuria and hematuria.  Musculoskeletal:  Negative for neck pain and neck stiffness.       Left hand pain  Skin:  Negative for rash.  Neurological:  Negative for dizziness and weakness.  Psychiatric/Behavioral:  Negative for agitation and behavioral problems.       Objective:    Physical Exam Vitals reviewed.  Constitutional:      General: She is not in acute distress.    Appearance: She is obese. She is not diaphoretic.  HENT:     Head: Normocephalic and atraumatic.     Nose: Nose normal.     Mouth/Throat:     Mouth: Mucous membranes are moist.  Eyes:     General: No scleral icterus.    Extraocular Movements: Extraocular movements intact.  Cardiovascular:     Rate and Rhythm: Normal rate and regular rhythm.     Heart sounds: Normal heart sounds. No murmur heard. Pulmonary:     Breath sounds: Normal breath sounds. No wheezing or rales.  Musculoskeletal:     Cervical back: Neck supple. No tenderness.     Right lower leg: No edema.     Left lower leg: No edema.  Skin:     General: Skin is warm.     Findings: No rash.     Comments: Scar over left hand - well-healing  Neurological:     General: No focal deficit present.     Mental Status: She is alert and oriented to person, place, and time.  Psychiatric:        Mood and Affect: Mood normal.        Behavior: Behavior normal.     BP (!) 152/72 (BP Location: Left Arm)   Pulse 81   Ht 5' 7 (1.702 m)   Wt 167 lb (75.8 kg)   LMP 04/26/2016   SpO2 98%   BMI 26.16 kg/m  Wt Readings from Last 3 Encounters:  03/01/24 167 lb (75.8 kg)  02/02/24 170 lb (77.1 kg)  10/31/23 180 lb 12.8 oz (82 kg)    Lab Results  Component Value Date   TSH 5.720 (H) 02/24/2024   Lab Results  Component Value Date   WBC 7.0 02/24/2024   HGB 10.5 (L) 02/24/2024   HCT 30.6 (L) 02/24/2024   MCV 92.7 02/24/2024   PLT 254 02/24/2024   Lab Results  Component Value Date   NA 141 02/24/2024   K 3.6 02/24/2024   CO2 20 (L) 02/24/2024   GLUCOSE 114 (H) 02/24/2024   BUN 25 (H) 02/24/2024   CREATININE 1.11 (H) 02/24/2024   BILITOT 0.3 02/24/2024   ALKPHOS 89 02/24/2024   AST 27 02/24/2024   ALT 30 02/24/2024   PROT 7.6 02/24/2024   ALBUMIN 4.4 02/24/2024   CALCIUM  9.3 02/24/2024   ANIONGAP 14 02/24/2024   EGFR 56 (L) 08/18/2023   Lab Results  Component Value Date   CHOL 155 02/24/2024   Lab Results  Component Value Date   HDL 44 02/24/2024  Lab Results  Component Value Date   LDLCALC 49 02/24/2024   Lab Results  Component Value Date   TRIG 307 (H) 02/24/2024   Lab Results  Component Value Date   CHOLHDL 3.5 02/24/2024   Lab Results  Component Value Date   HGBA1C 6.1 (H) 02/24/2024      Assessment & Plan:   Problem List Items Addressed This Visit       Cardiovascular and Mediastinum   Essential hypertension   BP Readings from Last 1 Encounters:  03/01/24 (!) 152/72   Uncontrolled with amlodipine  10 mg QD and hydralazine  25 mg twice daily Has run out of Coreg  6.25 mg twice daily,  discontinued for now Considering her proteinuria, started olmesartan 10 mg QD Counseled for compliance with the medications Advised DASH diet and moderate exercise/walking, at least 150 mins/week      Relevant Medications   olmesartan (BENICAR) 20 MG tablet   hydrALAZINE  (APRESOLINE ) 25 MG tablet   atorvastatin  (LIPITOR) 20 MG tablet   Other Relevant Orders   CMP14+EGFR   CBC with Differential/Platelet     Endocrine   Hypothyroidism   Lab Results  Component Value Date   TSH 5.720 (H) 02/24/2024   On levothyroxine  100 mcg QD currently, increased to 112 mcg QD now Check TSH and free T4 after 3 months      Relevant Medications   levothyroxine  (SYNTHROID ) 112 MCG tablet   Other Relevant Orders   TSH + free T4   Type 2 diabetes mellitus with ophthalmic complication (HCC) - Primary   Lab Results  Component Value Date   HGBA1C 6.1 (H) 02/24/2024   Was uncontrolled due to diet noncompliance, but improved now On metformin  500 mg twice daily and Lantus  28 units nightly On Ozempic  0.5 mg - plan to increase dose as tolerated - has intermittent nausea, Zofran  prescribed Advised to follow diabetic diet On statin F/u CMP and lipid panel Diabetic eye exam: Advised to follow up with Ophthalmology for diabetic eye exam, sees clinic in [place]      Relevant Medications   olmesartan (BENICAR) 20 MG tablet   Semaglutide ,0.25 or 0.5MG /DOS, (OZEMPIC , 0.25 OR 0.5 MG/DOSE,) 2 MG/3ML SOPN   metFORMIN  (GLUCOPHAGE ) 500 MG tablet   insulin glargine  (LANTUS  SOLOSTAR) 100 UNIT/ML Solostar Pen   gabapentin  (NEURONTIN ) 300 MG capsule   atorvastatin  (LIPITOR) 20 MG tablet   Other Relevant Orders   CMP14+EGFR   Hemoglobin A1c     Genitourinary   CKD (chronic kidney disease) stage 3, GFR 30-59 ml/min (HCC)   Last BMP reviewed, GFR remains better now Advised to maintain adequate hydration Avoid nephrotoxic agents Started olmesartan for HTN and proteinuria Followed by nephrology-Dr. Rachele         Other   Mixed hyperlipidemia   On atorvastatin  20 mg QD      Relevant Medications   olmesartan (BENICAR) 20 MG tablet   hydrALAZINE  (APRESOLINE ) 25 MG tablet   atorvastatin  (LIPITOR) 20 MG tablet   Other Visit Diagnoses       Breast cancer screening by mammogram       Relevant Orders   MM 3D SCREENING MAMMOGRAM BILATERAL BREAST     Encounter for immunization       Relevant Orders   Flu vaccine trivalent PF, 6mos and older(Flulaval,Afluria,Fluarix,Fluzone) (Completed)       Meds ordered this encounter  Medications   olmesartan (BENICAR) 20 MG tablet    Sig: Take 0.5 tablets (10 mg total) by mouth daily.  Dispense:  45 tablet    Refill:  0   levothyroxine  (SYNTHROID ) 112 MCG tablet    Sig: Take 1 tablet (112 mcg total) by mouth daily before breakfast.    Dispense:  90 tablet    Refill:  1   Semaglutide ,0.25 or 0.5MG /DOS, (OZEMPIC , 0.25 OR 0.5 MG/DOSE,) 2 MG/3ML SOPN    Sig: Inject 0.5 mg into the skin every 7 (seven) days.    Dispense:  3 mL    Refill:  3   metFORMIN  (GLUCOPHAGE ) 500 MG tablet    Sig: Take 1 tablet (500 mg total) by mouth 2 (two) times daily with a meal.    Dispense:  180 tablet    Refill:  1   insulin glargine  (LANTUS  SOLOSTAR) 100 UNIT/ML Solostar Pen    Sig: Inject 28 Units into the skin daily.    Dispense:  15 mL    Refill:  2   hydrALAZINE  (APRESOLINE ) 25 MG tablet    Sig: Take 1 tablet (25 mg total) by mouth 2 (two) times daily.    Dispense:  180 tablet    Refill:  1   gabapentin  (NEURONTIN ) 300 MG capsule    Sig: Take 1 capsule (300 mg total) by mouth at bedtime.    Dispense:  90 capsule    Refill:  1   atorvastatin  (LIPITOR) 20 MG tablet    Sig: Take 1 tablet (20 mg total) by mouth daily.    Dispense:  90 tablet    Refill:  3    Follow-up: Return in about 3 months (around 05/30/2024) for DM and HTN.    Suzzane MARLA Blanch, MD

## 2024-03-01 NOTE — Assessment & Plan Note (Addendum)
 Last BMP reviewed, GFR remains better now Advised to maintain adequate hydration Avoid nephrotoxic agents Started olmesartan for HTN and proteinuria Followed by nephrology-Dr. Rachele

## 2024-03-09 ENCOUNTER — Other Ambulatory Visit: Payer: Self-pay

## 2024-03-09 ENCOUNTER — Emergency Department (HOSPITAL_COMMUNITY)
Admission: EM | Admit: 2024-03-09 | Discharge: 2024-03-09 | Disposition: A | Discharge status: Home / Self Care | Attending: Emergency Medicine | Admitting: Emergency Medicine

## 2024-03-09 ENCOUNTER — Emergency Department (HOSPITAL_COMMUNITY)

## 2024-03-09 ENCOUNTER — Encounter (HOSPITAL_COMMUNITY): Payer: Self-pay | Admitting: *Deleted

## 2024-03-09 DIAGNOSIS — E1122 Type 2 diabetes mellitus with diabetic chronic kidney disease: Secondary | ICD-10-CM | POA: Insufficient documentation

## 2024-03-09 DIAGNOSIS — B974 Respiratory syncytial virus as the cause of diseases classified elsewhere: Secondary | ICD-10-CM | POA: Insufficient documentation

## 2024-03-09 DIAGNOSIS — B338 Other specified viral diseases: Secondary | ICD-10-CM | POA: Diagnosis not present

## 2024-03-09 DIAGNOSIS — R509 Fever, unspecified: Secondary | ICD-10-CM | POA: Diagnosis not present

## 2024-03-09 DIAGNOSIS — I129 Hypertensive chronic kidney disease with stage 1 through stage 4 chronic kidney disease, or unspecified chronic kidney disease: Secondary | ICD-10-CM | POA: Insufficient documentation

## 2024-03-09 DIAGNOSIS — R059 Cough, unspecified: Secondary | ICD-10-CM | POA: Diagnosis not present

## 2024-03-09 DIAGNOSIS — N183 Chronic kidney disease, stage 3 unspecified: Secondary | ICD-10-CM | POA: Insufficient documentation

## 2024-03-09 LAB — CBG MONITORING, ED: Glucose-Capillary: 124 mg/dL — ABNORMAL HIGH (ref 70–99)

## 2024-03-09 LAB — RESP PANEL BY RT-PCR (RSV, FLU A&B, COVID)  RVPGX2
Influenza A by PCR: NEGATIVE
Influenza B by PCR: NEGATIVE
Resp Syncytial Virus by PCR: POSITIVE — AB
SARS Coronavirus 2 by RT PCR: NEGATIVE

## 2024-03-09 MED ORDER — ALBUTEROL SULFATE HFA 108 (90 BASE) MCG/ACT IN AERS
1.0000 | INHALATION_SPRAY | Freq: Once | RESPIRATORY_TRACT | Status: AC
  Administered 2024-03-09: 1 via RESPIRATORY_TRACT
  Filled 2024-03-09: qty 6.7

## 2024-03-09 MED ORDER — BENZONATATE 100 MG PO CAPS: 100.0000 mg | ORAL_CAPSULE | Freq: Three times a day (TID) | ORAL | 0 refills | Status: DC

## 2024-03-09 NOTE — ED Provider Notes (Signed)
 " [place] EMERGENCY DEPARTMENT AT [facility] Provider Note   CSN: [id number] Arrival date & time: 03/09/24  1136     Patient presents with: No chief complaint on file.   Sandra Parsons is a 53 y.o. female.  Patient is a 53 year old female with a history of stage III CKD and type 2 diabetes who presents to the ED for increasing chills, body aches, and cough/congestion for the past 2 to 3 days.  States she has taken over-the-counter cold medicine with minimal relief.  She denies any known sick contacts but notes her niece is sick with similar symptoms now as well.  States she has been able to eat and drink normally.  She has not had a fever.  Denies headache, dizziness, syncope, chest pain, shortness of breath, abdominal pain, nausea/vomiting/diarrhea.  No further complaints.   HPI     Prior to Admission medications  Medication Sig Start Date End Date Taking? Authorizing Provider  benzonatate (TESSALON) 100 MG capsule Take 1 capsule (100 mg total) by mouth every 8 (eight) hours. 03/09/24  Yes Marcquis Ridlon, Thersia RAMAN, PA-C  amLODipine  (NORVASC ) 10 MG tablet Take 1 tablet (10 mg total) by mouth daily. 08/18/23   Tobie Suzzane POUR, MD  atorvastatin  (LIPITOR) 20 MG tablet Take 1 tablet (20 mg total) by mouth daily. 03/01/24   Tobie Suzzane POUR, MD  gabapentin  (NEURONTIN ) 300 MG capsule Take 1 capsule (300 mg total) by mouth at bedtime. 03/01/24   Tobie Suzzane POUR, MD  hydrALAZINE  (APRESOLINE ) 25 MG tablet Take 1 tablet (25 mg total) by mouth 2 (two) times daily. 03/01/24   Tobie Suzzane POUR, MD  insulin glargine  (LANTUS  SOLOSTAR) 100 UNIT/ML Solostar Pen Inject 28 Units into the skin daily. 03/01/24   Tobie Suzzane POUR, MD  levothyroxine  (SYNTHROID ) 112 MCG tablet Take 1 tablet (112 mcg total) by mouth daily before breakfast. 03/01/24   Tobie Suzzane POUR, MD  metFORMIN  (GLUCOPHAGE ) 500 MG tablet Take 1 tablet (500 mg total) by mouth 2 (two) times daily with a meal. 03/01/24   Tobie Suzzane POUR, MD   Multiple Vitamin (MULTIVITAMIN) tablet Take 1 tablet by mouth daily.    [provider]  olmesartan  (BENICAR ) 20 MG tablet Take 0.5 tablets (10 mg total) by mouth daily. 03/01/24   Tobie Suzzane POUR, MD  ondansetron  (ZOFRAN ) 4 MG tablet Take 1 tablet (4 mg total) by mouth every 6 (six) hours. 02/02/24   Lloyd, Megan L, PA  Semaglutide ,0.25 or 0.5MG /DOS, (OZEMPIC , 0.25 OR 0.5 MG/DOSE,) 2 MG/3ML SOPN Inject 0.5 mg into the skin every 7 (seven) days. 03/01/24   Tobie Suzzane POUR, MD    Allergies: Farxiga [dapagliflozin] and Prednisone     Review of Systems  Constitutional:  Positive for chills. Negative for fever.  HENT:  Positive for congestion.   Respiratory:  Positive for cough. Negative for shortness of breath.   Cardiovascular:  Negative for chest pain.  Gastrointestinal:  Negative for abdominal pain, constipation, diarrhea, nausea and vomiting.  Musculoskeletal:  Positive for myalgias.    Updated Vital Signs BP (!) 152/73   Pulse 72   Temp 97.7 F (36.5 C) (Oral)   Resp 18   Ht 5' 7 (1.702 m)   Wt 75.8 kg   LMP 04/26/2016   SpO2 96%   BMI 26.16 kg/m   Physical Exam Constitutional:      Appearance: Normal appearance.  HENT:     Head: Normocephalic and atraumatic.     Nose: Rhinorrhea present.  Mouth/Throat:     Mouth: Mucous membranes are moist.     Pharynx: Oropharynx is clear.  Cardiovascular:     Rate and Rhythm: Normal rate.  Pulmonary:     Effort: Pulmonary effort is normal.     Breath sounds: Normal breath sounds.  Skin:    General: Skin is warm and dry.  Neurological:     Mental Status: She is alert and oriented to person, place, and time.  Psychiatric:        Mood and Affect: Mood normal.        Behavior: Behavior normal.     (all labs ordered are listed, but only abnormal results are displayed) Labs Reviewed  RESP PANEL BY RT-PCR (RSV, FLU A&B, COVID)  RVPGX2 - Abnormal; Notable for the following components:      Result Value   Resp  Syncytial Virus by PCR POSITIVE (*)    All other components within normal limits  CBG MONITORING, ED - Abnormal; Notable for the following components:   Glucose-Capillary 124 (*)    All other components within normal limits    EKG: None  Radiology: DG Chest Portable 1 View Result Date: 03/09/2024 EXAM: 1 VIEW XRAY OF THE CHEST 03/09/2024 12:13:10 PM COMPARISON: 04/26/2022 CLINICAL HISTORY: cough, fever FINDINGS: LUNGS AND PLEURA: No focal pulmonary opacity. No pleural effusion. No pneumothorax. HEART AND MEDIASTINUM: No acute abnormality of the cardiac and mediastinal silhouettes. BONES AND SOFT TISSUES: No acute osseous abnormality. IMPRESSION: 1. No acute cardiopulmonary abnormality. Electronically signed by: Waddell Calk MD 03/09/2024 02:20 PM EST RP Workstation: [id number]      Medications Ordered in the ED  albuterol  (VENTOLIN  HFA) 108 (90 Base) MCG/ACT inhaler 1 puff (has no administration in time range)    Clinical Course as of 03/09/24 1423  Fri Mar 09, 2024  1236 Respiratory Syncytial Virus by PCR(!): POSITIVE [AY]    Clinical Course User Index [AY] Neysa Thersia RAMAN, PA-C                                Medical Decision Making Patient is a 53 year old female with a history of hypertension and type 2 diabetes who presents to the ED for increasing flulike symptoms for the past 2 to 3 days.  Please see detailed HPI above.  On exam patient is alert and in no acute distress.  Physical exam as noted above.  Includes acute viral illness, bronchitis, pneumonia, meningitis.  Chest x-ray obtained and negative for acute process today.  Viral swab is positive for RSV.  Suspect this is likely cause of patient's symptoms.  Less concerns for underlying pneumonia with negative imaging and patient is afebrile.  No meningismus appreciated.  She is well-appearing otherwise and vital signs are stable with oxygen  stable on room air.  Stable for discharge home today.  Patient provided albuterol   inhaler while in ED to go home with.  Prescribed Tessalon.  Symptomatic care discussed.  Advised PCP follow-up in 1 week if no improvement.  Return precautions provided.  Amount and/or Complexity of Data Reviewed Labs:  Decision-making details documented in ED Course. Radiology: ordered.  Risk Prescription drug management.       Final diagnoses:  Infection due to respiratory syncytial virus (RSV), unspecified infection type    ED Discharge Orders          Ordered    benzonatate (TESSALON) 100 MG capsule  Every 8 hours  03/09/24 1418               Neysa Thersia RAMAN, PA-C 03/09/24 1424    Suzette Pac, MD 03/09/24 1754  "

## 2024-03-09 NOTE — Discharge Instructions (Signed)
 May take Tessalon  2-3 times a day as needed for cough.  Stay hydrated and drink plenty of fluids.  Take something with Tylenol  in it every 6 hours as needed for fever/chills/bodyaches.  May use albuterol  inhaler 1 to 2 puffs every 4-6 hours as needed for any chest tightness/wheezing.  Follow-up with PCP in 1 week if no improvement.  Return to ED if any symptoms worsen including uncontrollable fevers, chest pain, difficulty breathing.

## 2024-03-09 NOTE — ED Triage Notes (Signed)
+   cough + body aches + sore throat + chills, unk of fevers x 3 days  Received flu vaccine on the 11th

## 2024-03-19 ENCOUNTER — Ambulatory Visit (HOSPITAL_COMMUNITY)

## 2024-04-20 NOTE — [facility] Nurse Program (Signed)
 Sandra Parsons in to [name] today to re-enroll in [facility] as she no longer qualifies for Medicaid. Enrollment completed by A. Spruill Care Guide for [facility].  Client was last being followed by [facility] office and was last seen in December by Dr. Tobie and her last A1C was 6.1  She reports she no longer has insurance with Medicaid and would like to return to [facility]. She reports she has her medications but is concerned she will run out of her Lantus  before she can be seen again. She is currently taking 28 units per day subcutaneously.  [facility] to [facility] with [facility] and provider as client works daily until afternoon. [facility] states [facility] can be scheduled for 04/24/24 at 230 pm. They also state that they have Lantus  Pen that can be picked up so that client does not run out of Medication. This RN went to pick up the Lantus  Pen for client while she continued the paperwork of her application. Client given the Pen with instructions that she will need to get Pen Tips, she states she has some. Provided client with her AVR for her upcoming appointment and discussed that to check with any inclement weather as that may change her appointment and discussed taking her current medications with her to the appointment.  Her enrollment included [facility] pending approval, [facility] until 03/26/25, and [facility] FA pending approval  Will follow as needed for resources and education.  Avelina FABIENE Skeen RN [facility]

## 2024-04-24 ENCOUNTER — Encounter: Payer: Self-pay | Admitting: Physician Assistant

## 2024-04-24 ENCOUNTER — Ambulatory Visit: Payer: Self-pay | Admitting: Physician Assistant

## 2024-04-24 VITALS — BP 130/74 | HR 80 | Wt 174.0 lb

## 2024-04-24 DIAGNOSIS — Z1211 Encounter for screening for malignant neoplasm of colon: Secondary | ICD-10-CM

## 2024-04-24 DIAGNOSIS — Z7689 Persons encountering health services in other specified circumstances: Secondary | ICD-10-CM

## 2024-04-24 DIAGNOSIS — Z1231 Encounter for screening mammogram for malignant neoplasm of breast: Secondary | ICD-10-CM

## 2024-04-24 DIAGNOSIS — E039 Hypothyroidism, unspecified: Secondary | ICD-10-CM

## 2024-04-24 DIAGNOSIS — E785 Hyperlipidemia, unspecified: Secondary | ICD-10-CM

## 2024-04-24 DIAGNOSIS — N189 Chronic kidney disease, unspecified: Secondary | ICD-10-CM

## 2024-04-24 DIAGNOSIS — E118 Type 2 diabetes mellitus with unspecified complications: Secondary | ICD-10-CM

## 2024-04-24 DIAGNOSIS — D649 Anemia, unspecified: Secondary | ICD-10-CM

## 2024-04-24 DIAGNOSIS — I10 Essential (primary) hypertension: Secondary | ICD-10-CM

## 2024-04-25 NOTE — Congregational Nurse Program (Signed)
 Called for follow up after re-establishing care with [facility] after Medicaid ending.  She states her appointment went well and no changes in her medications at this time.  She is scheduled for follow up on 05/29/24 and is to have labs drawn prior to appointment.  Encouraged client to attempt to take her medications with her to the next appointment. She leaves from work to go to her appointment, suggested taking a cooler bag to hold her medications in while at work. She works at [employer].   Will continue to follow. Client with no needs at this time.   Sandra Parsons Skeen RN [facility] [employer]

## 2024-05-29 ENCOUNTER — Ambulatory Visit: Payer: Self-pay | Admitting: Physician Assistant

## 2024-05-30 ENCOUNTER — Ambulatory Visit: Payer: Self-pay | Admitting: Internal Medicine
